# Patient Record
Sex: Male | Born: 1980 | Race: Black or African American | Hispanic: No | Marital: Married
Health system: Southern US, Community
[De-identification: ages and names within clinical notes are randomized; demographics above are authoritative.]

## PROBLEM LIST (undated history)

## (undated) DIAGNOSIS — K5792 Diverticulitis of intestine, part unspecified, without perforation or abscess without bleeding: Secondary | ICD-10-CM

## (undated) DIAGNOSIS — K4090 Unilateral inguinal hernia, without obstruction or gangrene, not specified as recurrent: Secondary | ICD-10-CM

## (undated) DIAGNOSIS — F419 Anxiety disorder, unspecified: Secondary | ICD-10-CM

## (undated) HISTORY — PX: PROSTATE BIOPSY: SHX241

---

## 2008-03-22 HISTORY — PX: HERNIA REPAIR: SHX51

## 2009-04-10 ENCOUNTER — Emergency Department (HOSPITAL_COMMUNITY): Admission: EM | Admit: 2009-04-10 | Discharge: 2009-04-10 | Payer: Self-pay | Admitting: Emergency Medicine

## 2010-02-08 ENCOUNTER — Emergency Department (HOSPITAL_COMMUNITY): Admission: EM | Admit: 2010-02-08 | Discharge: 2010-02-08 | Payer: Self-pay | Admitting: Emergency Medicine

## 2010-06-02 LAB — POCT I-STAT, CHEM 8
BUN: 8 mg/dL (ref 6–23)
Calcium, Ion: 1.13 mmol/L (ref 1.12–1.32)
Chloride: 107 meq/L (ref 96–112)
Creatinine, Ser: 0.9 mg/dL (ref 0.4–1.5)
Glucose, Bld: 97 mg/dL (ref 70–99)
HCT: 48 % (ref 39.0–52.0)
Hemoglobin: 16.3 g/dL (ref 13.0–17.0)
Potassium: 4.2 mEq/L (ref 3.5–5.1)
Sodium: 140 meq/L (ref 135–145)
TCO2: 25 mmol/L (ref 0–100)

## 2010-06-02 LAB — POCT CARDIAC MARKERS
CKMB, poc: <1 ng/mL — ABNORMAL LOW (ref 1.0–8.0)
Myoglobin, poc: 45.4 ng/mL (ref 12–200)
Troponin i, poc: <0.05 ng/mL (ref 0.00–0.09)

## 2010-06-02 LAB — RAPID URINE DRUG SCREEN, HOSP PERFORMED
Amphetamines: NOT DETECTED
Barbiturates: NOT DETECTED
Benzodiazepines: NOT DETECTED
Cocaine: NOT DETECTED
Opiates: NOT DETECTED
Tetrahydrocannabinol: POSITIVE — AB

## 2010-06-02 LAB — GLUCOSE, CAPILLARY: Glucose-Capillary: 99 mg/dL (ref 70–99)

## 2013-02-06 ENCOUNTER — Encounter (HOSPITAL_COMMUNITY): Payer: Self-pay | Admitting: Emergency Medicine

## 2013-02-06 ENCOUNTER — Emergency Department (HOSPITAL_COMMUNITY)
Admission: EM | Admit: 2013-02-06 | Discharge: 2013-02-06 | Disposition: A | Discharge status: Home / Self Care | Payer: BC Managed Care – PPO | Attending: Emergency Medicine | Admitting: Emergency Medicine

## 2013-02-06 DIAGNOSIS — K4090 Unilateral inguinal hernia, without obstruction or gangrene, not specified as recurrent: Secondary | ICD-10-CM

## 2013-02-06 DIAGNOSIS — F17200 Nicotine dependence, unspecified, uncomplicated: Secondary | ICD-10-CM | POA: Insufficient documentation

## 2013-02-06 DIAGNOSIS — N5089 Other specified disorders of the male genital organs: Secondary | ICD-10-CM | POA: Insufficient documentation

## 2013-02-06 DIAGNOSIS — R197 Diarrhea, unspecified: Secondary | ICD-10-CM | POA: Insufficient documentation

## 2013-02-06 DIAGNOSIS — Z791 Long term (current) use of non-steroidal anti-inflammatories (NSAID): Secondary | ICD-10-CM | POA: Insufficient documentation

## 2013-02-06 HISTORY — DX: Unilateral inguinal hernia, without obstruction or gangrene, not specified as recurrent: K40.90

## 2013-02-06 MED ORDER — NAPROXEN SODIUM 220 MG PO TABS: 220.0000 mg | ORAL_TABLET | Freq: Two times a day (BID) | ORAL | Status: DC

## 2013-02-06 MED ORDER — OXYCODONE-ACETAMINOPHEN 5-325 MG PO TABS: 1.0000 | ORAL_TABLET | Freq: Four times a day (QID) | ORAL | Status: DC | PRN

## 2013-02-06 NOTE — ED Provider Notes (Signed)
CSN: [id number]     Arrival date & time 02/06/13  1442 History  This chart was scribed for Isaiah Crigler, PA working with Shelda Jakes, MD by Quintella Reichert, ED Scribe. This patient was seen in room TR10C/TR10C and the patient's care was started at 2:56 PM.   Chief Complaint  Patient presents with  . Groin Pain    The history is provided by the patient. No language interpreter was used.    HPI Comments: Murrell Dome is a 32 y.o. male who presents to the Emergency Department complaining of several months of gradually-worsening recurrent left groin pain.  Pt had a hernia repair in 2010 in [place], [place] and states that over the past 90 days he has had a recurrence of hernia pain.  He states it has been gradually worsening and now has become "unbearable" but he denies any sudden or acute changes in severity.  He states he "feel like something's wrong" in that area of his body.  He also states his scrotum swells occasionally.  Today he has had diarrhea but he states he has otherwise been moving his bowels regularly.  He is passing gas.  He denies nausea or vomiting.  Pt went to UC several months ago for a separate issues and was advised that he also has a hernia on the right side although he has not felt pain on that side. Pain is not different in character or severity today.    Past Medical History  Diagnosis Date  . Inguinal hernia, left     Past Surgical History  Procedure Laterality Date  . Hernia repair      History reviewed. No pertinent family history.   History  Substance Use Topics  . Smoking status: Current Some Day Smoker  . Smokeless tobacco: Not on file  . Alcohol Use: Yes     Review of Systems  Constitutional: Negative for fever.  HENT: Negative for rhinorrhea and sore throat.   Eyes: Negative for redness.  Respiratory: Negative for cough.   Cardiovascular: Negative for chest pain.  Gastrointestinal: Positive for abdominal pain and diarrhea (only  today). Negative for nausea, vomiting and constipation.  Genitourinary: Positive for scrotal swelling. Negative for dysuria and testicular pain.       Groin pain  Musculoskeletal: Negative for myalgias.  Skin: Negative for rash.  Neurological: Negative for headaches.     Allergies  Review of patient's allergies indicates no known allergies.  Home Medications   Current Outpatient Rx  Name  Route  Sig  Dispense  Refill  . naproxen sodium (ALEVE) 220 MG tablet   Oral   Take 1 tablet (220 mg total) by mouth 2 (two) times daily with a meal.   20 tablet   0   . oxyCODONE-acetaminophen (PERCOCET/ROXICET) 5-325 MG per tablet   Oral   Take 1-2 tablets by mouth every 6 (six) hours as needed for severe pain.   12 tablet   0     BP 136/73  Pulse 73  Temp(Src) 98.7 F (37.1 C)  Resp 16  SpO2 99%  Physical Exam  Nursing note and vitals reviewed. Constitutional: He appears well-developed and well-nourished. No distress.  HENT:  Head: Normocephalic and atraumatic.  Eyes: Conjunctivae and EOM are normal. Right eye exhibits no discharge. Left eye exhibits no discharge.  Neck: Normal range of motion. Neck supple. No tracheal deviation present.  Cardiovascular: Normal rate, regular rhythm and normal heart sounds.   Pulmonary/Chest: Effort normal and breath sounds normal.  No respiratory distress.  Abdominal: Soft. There is no tenderness. A hernia is present. Hernia confirmed positive in the left inguinal area. Hernia confirmed negative in the right inguinal area.  Genitourinary:    Right testis shows no mass, no swelling and no tenderness. Left testis shows no mass, no swelling and no tenderness.  Musculoskeletal: Normal range of motion.  Lymphadenopathy:       Right: No inguinal adenopathy present.       Left: No inguinal adenopathy present.  Neurological: He is alert.  Skin: Skin is warm and dry.  Psychiatric: He has a normal mood and affect. His behavior is normal.    ED  Course  Procedures (including critical care time)  DIAGNOSTIC STUDIES: Oxygen Saturation is 99% on room air, normal by my interpretation.    COORDINATION OF CARE: 3:01 PM-Discussed treatment plan which includes pain medication and f/u with surgeon.  Advised return precautions.  Pt expressed understanding and agreed with plan.   Labs Review Labs Reviewed - No data to display  Imaging Review No results found.  EKG Interpretation   None      Patient seen and examined. Patient given CCS referral.   Vital signs reviewed and are as follows: Filed Vitals:   02/06/13 1450  BP: 136/73  Pulse: 73  Temp: 98.7 F (37.1 C)  Resp: 16   Patient counseled on use of narcotic pain medications. Counseled not to combine these medications with others containing tylenol. Urged not to drink alcohol, drive, or perform any other activities that requires focus while taking these medications. The patient verbalizes understanding and agrees with the plan.  Patient told to return with signs of obstruction including worsening abdominal pain, abdominal swelling, constipation, not passing gas -- 4 with signs of incarceration or strangulation including worsening pain, overlying redness. Patient verbalizes understanding and agrees with plan.   MDM   1. Inguinal hernia    Patient with left inguinal hernia and pain. Suspect failure of mesh, although he will need surgery followup for definitive evaluation and treatment. There are no signs of obstruction today. There are no signs of strangulation today. Patient's symptoms have been gradually worsening and there is no acute change to suggest these. Do not feel imaging necessary. Testicle non-tender. Pain control and follow-up indicated.    I personally performed the services described in this documentation, which was scribed in my presence. The recorded information has been reviewed and is accurate.    Isaiah Crigler, PA-C 02/06/13 1524

## 2013-02-06 NOTE — ED Notes (Addendum)
Left groin pain  Since surgery 2010 and it has gotten worse the last 90 days  Lifts heavy things for work left testicle swollen and tender since this am

## 2013-02-06 NOTE — ED Notes (Signed)
PA at bedside.

## 2013-02-10 NOTE — ED Provider Notes (Signed)
Medical screening examination/treatment/procedure(s) were performed by non-physician practitioner and as supervising physician I was immediately available for consultation/collaboration.  EKG Interpretation   None         Lyrique Hakim W. Aayush Gelpi, MD 02/10/13 1358 

## 2013-02-13 ENCOUNTER — Emergency Department (HOSPITAL_COMMUNITY)
Admission: EM | Admit: 2013-02-13 | Discharge: 2013-02-13 | Disposition: A | Discharge status: Home / Self Care | Payer: BC Managed Care – PPO | Attending: Emergency Medicine | Admitting: Emergency Medicine

## 2013-02-13 ENCOUNTER — Encounter (HOSPITAL_COMMUNITY): Payer: Self-pay | Admitting: Emergency Medicine

## 2013-02-13 ENCOUNTER — Encounter (INDEPENDENT_AMBULATORY_CARE_PROVIDER_SITE_OTHER): Payer: BC Managed Care – PPO | Admitting: Surgery

## 2013-02-13 DIAGNOSIS — R1032 Left lower quadrant pain: Secondary | ICD-10-CM | POA: Insufficient documentation

## 2013-02-13 DIAGNOSIS — R109 Unspecified abdominal pain: Secondary | ICD-10-CM

## 2013-02-13 DIAGNOSIS — F17200 Nicotine dependence, unspecified, uncomplicated: Secondary | ICD-10-CM | POA: Insufficient documentation

## 2013-02-13 DIAGNOSIS — Z9889 Other specified postprocedural states: Secondary | ICD-10-CM | POA: Insufficient documentation

## 2013-02-13 DIAGNOSIS — G8929 Other chronic pain: Secondary | ICD-10-CM | POA: Insufficient documentation

## 2013-02-13 LAB — BASIC METABOLIC PANEL
BUN: 11 mg/dL (ref 6–23)
Calcium: 9.6 mg/dL (ref 8.4–10.5)
Creatinine, Ser: 0.84 mg/dL (ref 0.50–1.35)
GFR calc Af Amer: >90 mL/min (ref >90)

## 2013-02-13 MED ORDER — MORPHINE SULFATE 4 MG/ML IJ SOLN
4.0000 mg | Freq: Once | INTRAMUSCULAR | Status: AC
  Administered 2013-02-13: 4 mg via INTRAVENOUS
  Filled 2013-02-13: qty 1

## 2013-02-13 MED ORDER — OXYCODONE-ACETAMINOPHEN 5-325 MG PO TABS: 1.0000 | ORAL_TABLET | ORAL | Status: DC | PRN

## 2013-02-13 MED ORDER — ONDANSETRON HCL 4 MG/2ML IJ SOLN
4.0000 mg | Freq: Once | INTRAMUSCULAR | Status: AC
  Administered 2013-02-13: 4 mg via INTRAVENOUS
  Filled 2013-02-13: qty 2

## 2013-02-13 NOTE — ED Notes (Signed)
Consulting MD at bedside

## 2013-02-13 NOTE — ED Notes (Signed)
Pt verbalizes he was at [facility] ED Nov 18 and was D/C'd and Dx w/ a hiatal hernia. Pt followed up w/ PCP yesterday and was sent to [facility] ED for a possible SBO.

## 2013-02-13 NOTE — ED Provider Notes (Signed)
I have reviewed the report and personally reviewed the above radiology studies.    Hilario Quarry, MD 02/13/13 Rickey Primus

## 2013-02-13 NOTE — Consult Note (Signed)
Isaiah Heath [birth year]  [id number].    Requesting MD: Dr. Rosalia Hammers Chief Complaint/Reason for Consult: Left groin pain  HPI:  32 y/o AA male who has a h/o of a left inguinal hernia repair with mesh in 2010.  For the last 3 months has had left groin pain which radiates to his pubic bone and down his inguinal canal.  He presented today (02/13/13) to [facility] due to the pain worsening.  The pain is there all the time, and he notes that there is some radiating pain to the scrotum and burning pain on the inner thigh.  He complains of not being able to work, care for his child etc.  He states lying down and sleeping improves the pain.  Ambulating worsens the pain.  Came to the ER on 02/06/13 for the same issue and was thought to have a hernia.  Pt thinks his mesh from his previous hernia repair is the problem and says his pain is unbearable.  Percocet's do not work for the pain.  He is accompanied by his male significant other.    He was seen in his PCP office through [facility] and they obtained a CT.  The CT shows normal post-hernia repair characteristics without recurrent hernia.  There were scattered air-fluid levels within mildly dilated loops of upper left quadrant and central small bowel, but oral contrast extends through the terminal ileum and cecum.  Radiologist questioned enteritis or SBO.  Pt denies any other symptoms including fever/chills, N/V/D, changes in bowel/bladder habits, abdominal pain, dizziness or weakness.    ROS: All systems reviewed and otherwise negative except for as above  No family history on file.  Past Medical History  Diagnosis Date  . Inguinal hernia, left     Past Surgical History  Procedure Laterality Date  . Hernia repair Left [place], [place] [facility] surgery, Dr. Nelda Marseille    Social History:  reports that he has been smoking.  He does not have any smokeless tobacco history on file. He reports that he drinks alcohol. His drug history is not on  file.  Allergies: No Known Allergies   (Not in a hospital admission)  Blood pressure 129/83, pulse 75, temperature 97.9 F (36.6 C), temperature source Oral, resp. rate 22, height 6\' 2"  (1.88 m), weight 153 lb (69.4 kg), SpO2 100.00%. Physical Exam: General: pleasant, WD/WN AA male who is laying in bed in NAD HEENT: head is normocephalic, atraumatic.  Sclera are noninjected.  PERRL.  Ears and nose without any masses or lesions.  Mouth is pink and moist Heart: regular, rate, and rhythm.  No obvious murmurs, gallops, or rubs noted.  Palpable pedal pulses bilaterally Lungs: CTAB, no wheezes, rhonchi, or rales noted.  Respiratory effort nonlabored Abd: soft, NT/ND, +BS, no masses, hernias, or organomegaly GU:  No hernias palpated b/l, left groin with noted mesh which protrudes through the skin more when standing, quite tender to palpation, tenderness to the left pubic region and inguinal canal, right groin is normal, no abnormalities to b/l testicles or scrotum, no penile discharge MS: all 4 extremities are symmetrical with no cyanosis, clubbing, or edema. Skin: warm and dry with no masses, lesions, or rashes Psych: A&Ox3 with an appropriate affect  Results for orders placed during the hospital encounter of 02/13/13 (from the past 48 hour(s))  BASIC METABOLIC PANEL     Status: Abnormal   Collection Time    02/13/13  2:32 PM      Result Value Range  Sodium 139  135 - 145 mEq/L   Potassium 5.2 (*) 3.5 - 5.1 mEq/L   Comment: HEMOLYSIS AT THIS LEVEL MAY AFFECT RESULT   Chloride 102  96 - 112 mEq/L   CO2 29  19 - 32 mEq/L   Glucose, Bld 93  70 - 99 mg/dL   BUN 11  6 - 23 mg/dL   Creatinine, Ser 1.61  0.50 - 1.35 mg/dL   Calcium 9.6  8.4 - 09.6 mg/dL   GFR calc non Af Amer >90  >90 mL/min   GFR calc Af Amer >90  >90 mL/min   Comment: (NOTE)     The eGFR has been calculated using the CKD EPI equation.     This calculation has not been validated in all clinical situations.     eGFR's  persistently <90 mL/min signify possible Chronic Kidney     Disease.   No results found.     Assessment/Plan Left groin/inguinal pain s/p hernia repair with mesh in 2010 in [place] without recurrence of hernia as seen on CT scan done at [facility] 1.  This is not an emergent/urgent problem; although it is a complex problem which will need to be further evaluated after operative report can be obtained for Dr. Dixon Boos review. 2.  Dr. Lindie Spruce would like to see the patient in the office after reviewing the documentation to discuss the possibility and feasibility of surgery.   3.  He can be discharged with pain medication and is referred to make an appointment with Dr. Lindie Spruce in the next few weeks.   Aris Georgia 02/13/2013, 4:19 PM Pager: [phone]

## 2013-02-13 NOTE — ED Provider Notes (Signed)
CSN: [id number]     Arrival date & time 02/13/13  1233 History   First MD Initiated Contact with Patient 02/13/13 1328     Chief Complaint  Patient presents with  . Abdominal Pain   (Consider location/radiation/quality/duration/timing/severity/associated sxs/prior Treatment) Patient is a 32 y.o. male presenting with abdominal pain. The history is provided by the patient. No language interpreter was used.  Abdominal Pain Pain location:  LLQ Associated symptoms: no chills and no fever   Associated symptoms comment:  Patient continues to have LLQ abdominal pain at previously repaired hernia site. He was seen in ED on 02/06/13 and by PCP at [facility] yesterday for same, with CT scan done yesterday showing "air-fluid levels within mildly dilated loops of LUQ and central abdominal small bowel". Labs done in office present with patient today without abnormality and include CBC w/diff, and a UA. No reported fever. Pain extends into left testicle without testicular swelling. No difficulty urinating. NO change in bowel movement, no melena.   Past Medical History  Diagnosis Date  . Inguinal hernia, left    Past Surgical History  Procedure Laterality Date  . Hernia repair     No family history on file. History  Substance Use Topics  . Smoking status: Current Some Day Smoker  . Smokeless tobacco: Not on file  . Alcohol Use: Yes    Review of Systems  Constitutional: Negative for fever and chills.  Gastrointestinal: Positive for abdominal pain.  Musculoskeletal: Negative.   Skin: Negative.   Neurological: Negative.     Allergies  Review of patient's allergies indicates no known allergies.  Home Medications   Current Outpatient Rx  Name  Route  Sig  Dispense  Refill  . oxyCODONE-acetaminophen (PERCOCET/ROXICET) 5-325 MG per tablet   Oral   Take 1-2 tablets by mouth every 6 (six) hours as needed for severe pain.   12 tablet   0    BP 117/71  Pulse 54  Temp(Src) 97.9 F (36.6  C) (Oral)  Resp 22  Ht 6\' 2"  (1.88 m)  Wt 153 lb (69.4 kg)  BMI 19.64 kg/m2  SpO2 99% Physical Exam  Constitutional: He is oriented to person, place, and time. He appears well-developed and well-nourished. No distress.  Pulmonary/Chest: Effort normal.  Abdominal:  Elongated inguinal mass on left that is firm, not compressible, significantly tender.   Genitourinary: No penile tenderness.  Left testicular tenderness without mass, or testicular swelling.  Musculoskeletal: Normal range of motion.  Neurological: He is alert and oriented to person, place, and time.    ED Course  Procedures (including critical care time) Labs Review Labs Reviewed - No data to display Imaging Review No results found. Results for orders placed during the hospital encounter of 02/13/13  BASIC METABOLIC PANEL      Result Value Range   Sodium 139  135 - 145 mEq/L   Potassium 5.2 (*) 3.5 - 5.1 mEq/L   Chloride 102  96 - 112 mEq/L   CO2 29  19 - 32 mEq/L   Glucose, Bld 93  70 - 99 mg/dL   BUN 11  6 - 23 mg/dL   Creatinine, Ser 7.82  0.50 - 1.35 mg/dL   Calcium 9.6  8.4 - 95.6 mg/dL   GFR calc non Af Amer >90  >90 mL/min   GFR calc Af Amer >90  >90 mL/min    EKG Interpretation   None       MDM  No diagnosis found. 1. Abdominal pain  2. Inguinal mass  Paperwork from [facility] faxed from office reviewed. Lab studies and CT results as per HPI. Surgery consulted.   Dr. Lindie Spruce has seen and evaluated patient, reviewed the CT. Does not feel immediate surgical repair required and he will be seen in the office.     Arnoldo Hooker, PA-C 02/13/13 1534  Arnoldo Hooker, PA-C 02/13/13 1639

## 2013-02-13 NOTE — Consult Note (Signed)
This patient has significant chronic left inguinal pain after a LIH open repair in [place] approximately 4 years ago.  On examination now he does not have evidence of a recurrent hernia, but he does have a bulging ridge of mesh on the left side which protrudes much more when he stands.  It is significant to the point where he cannot work and is disabled.  I do not believe that the patient is drug seeking, but this is a possibility.    No need for emergency surgery, but I will see the patient in the office for followup.  May need exploration and removal of mesh in the future.  Marta Lamas. Gae Bon, MD, FACS [phone] [facility]

## 2013-02-13 NOTE — ED Notes (Signed)
Pt told to come by MD to see a surgeon, states possibly has bowel obstruction. No vomiting/diarrhea. Had CT scan yesterday. LLQ abdominal pain began a week ago. Previous hernia repair in 2010.

## 2013-02-14 ENCOUNTER — Telehealth (INDEPENDENT_AMBULATORY_CARE_PROVIDER_SITE_OTHER): Payer: Self-pay

## 2013-02-14 NOTE — Telephone Encounter (Signed)
LMOM> office closed Friday. Please come to office Monday to have paperwork signed.

## 2013-02-19 ENCOUNTER — Telehealth (INDEPENDENT_AMBULATORY_CARE_PROVIDER_SITE_OTHER): Payer: Self-pay | Admitting: *Deleted

## 2013-02-19 NOTE — Telephone Encounter (Signed)
Error

## 2013-03-20 ENCOUNTER — Emergency Department (HOSPITAL_COMMUNITY): Payer: BC Managed Care – PPO

## 2013-03-20 ENCOUNTER — Emergency Department (HOSPITAL_COMMUNITY)
Admission: EM | Admit: 2013-03-20 | Discharge: 2013-03-20 | Disposition: A | Discharge status: Home / Self Care | Payer: BC Managed Care – PPO | Attending: Emergency Medicine | Admitting: Emergency Medicine

## 2013-03-20 ENCOUNTER — Ambulatory Visit (INDEPENDENT_AMBULATORY_CARE_PROVIDER_SITE_OTHER): Payer: BC Managed Care – PPO | Admitting: General Surgery

## 2013-03-20 ENCOUNTER — Encounter (HOSPITAL_COMMUNITY): Payer: Self-pay | Admitting: Emergency Medicine

## 2013-03-20 DIAGNOSIS — F17200 Nicotine dependence, unspecified, uncomplicated: Secondary | ICD-10-CM | POA: Insufficient documentation

## 2013-03-20 DIAGNOSIS — R630 Anorexia: Secondary | ICD-10-CM | POA: Insufficient documentation

## 2013-03-20 DIAGNOSIS — R1032 Left lower quadrant pain: Secondary | ICD-10-CM | POA: Insufficient documentation

## 2013-03-20 DIAGNOSIS — Z8719 Personal history of other diseases of the digestive system: Secondary | ICD-10-CM | POA: Insufficient documentation

## 2013-03-20 DIAGNOSIS — R109 Unspecified abdominal pain: Secondary | ICD-10-CM

## 2013-03-20 DIAGNOSIS — R634 Abnormal weight loss: Secondary | ICD-10-CM | POA: Insufficient documentation

## 2013-03-20 MED ORDER — ETODOLAC 200 MG PO CAPS
200.0000 mg | ORAL_CAPSULE | Freq: Three times a day (TID) | ORAL | Status: DC
Start: 2013-03-20 — End: 2013-11-16

## 2013-03-20 MED ORDER — FENTANYL CITRATE 0.05 MG/ML IJ SOLN
50.0000 ug | Freq: Once | INTRAMUSCULAR | Status: AC
  Administered 2013-03-20: 50 ug via NASAL
  Filled 2013-03-20: qty 2

## 2013-03-20 MED ORDER — TRAMADOL HCL 50 MG PO TABS: 50.0000 mg | ORAL_TABLET | Freq: Four times a day (QID) | ORAL | Status: DC | PRN

## 2013-03-20 NOTE — ED Notes (Signed)
Pt in c/o left inguinal hernia, states he has had it for about a month, seen here for same in the past, was supposed to see [place] [facility] this am but overslept and missed his appointment, here due to continued pain

## 2013-03-20 NOTE — ED Provider Notes (Signed)
CSN: [id number]     Arrival date & time 03/20/13  1345 History   First MD Initiated Contact with Patient 03/20/13 1655     Chief Complaint  Patient presents with  . Inguinal Hernia   (Consider location/radiation/quality/duration/timing/severity/associated sxs/prior Treatment) HPI Comments: Patient presents to ER for evaluation of abdominal pain. Patient has been experiencing left lower abdominal pain for more than a month. He has been evaluated in the ER and his primary care doctor for this. It has been felt that the patient's pain is secondary to recurrent inguinal hernia. Patient was supposed to followup with surgery, but did not make appointment. Patient is now in the ER with persistent, burning pain. At times pain is severe, worsens with movements. He reports occasionally gets a lump in the area. It is not currently present. There has not been any fever, nausea, vomiting or diarrhea.   Past Medical History  Diagnosis Date  . Inguinal hernia, left    Past Surgical History  Procedure Laterality Date  . Hernia repair Left [place], [place] [facility] surgery, Dr. Nelda Marseille   History reviewed. No pertinent family history. History  Substance Use Topics  . Smoking status: Current Some Day Smoker -- 10 years  . Smokeless tobacco: Not on file  . Alcohol Use: Yes    Review of Systems  Gastrointestinal: Positive for abdominal pain.  All other systems reviewed and are negative.    Allergies  Review of patient's allergies indicates no known allergies.  Home Medications  No current outpatient prescriptions on file. BP 110/59  Pulse 59  Temp(Src) 98.1 F (36.7 C) (Oral)  Resp 22  Wt 152 lb (68.947 kg)  SpO2 100% Physical Exam  Constitutional: He is oriented to person, place, and time. He appears well-developed and well-nourished. No distress.  HENT:  Head: Normocephalic and atraumatic.  Right Ear: Hearing normal.  Left Ear: Hearing normal.  Nose: Nose normal.    Mouth/Throat: Oropharynx is clear and moist and mucous membranes are normal.  Eyes: Conjunctivae and EOM are normal. Pupils are equal, round, and reactive to light.  Neck: Normal range of motion. Neck supple.  Cardiovascular: Regular rhythm, S1 normal and S2 normal.  Exam reveals no gallop and no friction rub.   No murmur heard. Pulmonary/Chest: Effort normal and breath sounds normal. No respiratory distress. He exhibits no tenderness.  Abdominal: Soft. Normal appearance and bowel sounds are normal. There is no hepatosplenomegaly. There is tenderness in the left lower quadrant. There is no rebound, no guarding, no tenderness at McBurney's point and negative Murphy's sign. No hernia.    Musculoskeletal: Normal range of motion.  Neurological: He is alert and oriented to person, place, and time. He has normal strength. No cranial nerve deficit or sensory deficit. Coordination normal. GCS eye subscore is 4. GCS verbal subscore is 5. GCS motor subscore is 6.  Skin: Skin is warm, dry and intact. No rash noted. No cyanosis.  Psychiatric: He has a normal mood and affect. His speech is normal and behavior is normal. Thought content normal.    ED Course  Procedures (including critical care time) Labs Review Labs Reviewed - No data to display Imaging Review Dg Abd Acute W/chest  03/20/2013   CLINICAL DATA:  Burning on left inguinal hernia repair site. There is a slight bulge has been there for some time. No chest complaints. Irregular bowel movements.  EXAM: ACUTE ABDOMEN SERIES (ABDOMEN 2 VIEW & CHEST 1 VIEW)  COMPARISON:  CT abdomen 02/13/2011  FINDINGS: There is no evidence of dilated bowel loops or free intraperitoneal air. No radiopaque calculi or other significant radiographic abnormality is seen. Heart size and mediastinal contours are within normal limits. Both lungs are clear.  IMPRESSION: Negative abdominal radiographs.  No acute cardiopulmonary disease.   Electronically Signed   By: Elige Ko   On: 03/20/2013 17:57    EKG Interpretation   None       MDM  Diagnosis: Abdominal pain  Patient presents to the ER for persistent left lower abdominal and inguinal pain. Patient is extremely anxious. Patient indicates that he has been doing research on the Internet and has become quite fixated on this surgical mesh that he has had placed. He feels like there is some kind of syndrome that has been causing him poor appetite, weight loss, chronic pain. He has apparently come across similar symptoms while searching the Internet.  Examination is unremarkable other than tenderness. He has some subcutaneous nodules at the area of previous inguinal hernia, possibly scar tissue but I do not feel any obvious hernia. There is certainly no evidence of incarcerated hernia that would require any urgent surgical repair.  Patient was counseled that a lot of his symptoms might be because he is obsessing about the symptoms. He was told that he needs to followup with a surgeon to have a specialist's interpretation of his current symptoms to determine ultimate treatment.    Gilda Crease, MD 03/20/13 [phone]

## 2013-03-27 ENCOUNTER — Encounter (INDEPENDENT_AMBULATORY_CARE_PROVIDER_SITE_OTHER): Payer: Self-pay | Admitting: General Surgery

## 2013-11-16 ENCOUNTER — Encounter (HOSPITAL_COMMUNITY): Payer: Self-pay | Admitting: Emergency Medicine

## 2013-11-16 ENCOUNTER — Emergency Department (HOSPITAL_COMMUNITY): Payer: BC Managed Care – PPO

## 2013-11-16 ENCOUNTER — Emergency Department (HOSPITAL_COMMUNITY)
Admission: EM | Admit: 2013-11-16 | Discharge: 2013-11-16 | Disposition: A | Discharge status: Home / Self Care | Payer: BC Managed Care – PPO | Attending: Emergency Medicine | Admitting: Emergency Medicine

## 2013-11-16 DIAGNOSIS — R1031 Right lower quadrant pain: Secondary | ICD-10-CM

## 2013-11-16 DIAGNOSIS — N5089 Other specified disorders of the male genital organs: Secondary | ICD-10-CM | POA: Insufficient documentation

## 2013-11-16 DIAGNOSIS — K921 Melena: Secondary | ICD-10-CM | POA: Diagnosis not present

## 2013-11-16 DIAGNOSIS — F17200 Nicotine dependence, unspecified, uncomplicated: Secondary | ICD-10-CM | POA: Diagnosis not present

## 2013-11-16 DIAGNOSIS — K649 Unspecified hemorrhoids: Secondary | ICD-10-CM | POA: Diagnosis not present

## 2013-11-16 DIAGNOSIS — R1032 Left lower quadrant pain: Secondary | ICD-10-CM | POA: Diagnosis not present

## 2013-11-16 DIAGNOSIS — Z8719 Personal history of other diseases of the digestive system: Secondary | ICD-10-CM | POA: Diagnosis not present

## 2013-11-16 DIAGNOSIS — R109 Unspecified abdominal pain: Secondary | ICD-10-CM | POA: Insufficient documentation

## 2013-11-16 DIAGNOSIS — R350 Frequency of micturition: Secondary | ICD-10-CM | POA: Diagnosis not present

## 2013-11-16 DIAGNOSIS — K648 Other hemorrhoids: Secondary | ICD-10-CM

## 2013-11-16 LAB — COMPREHENSIVE METABOLIC PANEL
ALK PHOS: 74 U/L (ref 39–117)
ALT: 12 U/L (ref 0–53)
AST: 17 U/L (ref 0–37)
Albumin: 4.4 g/dL (ref 3.5–5.2)
Anion gap: 11 (ref 5–15)
BUN: 7 mg/dL (ref 6–23)
CALCIUM: 9.6 mg/dL (ref 8.4–10.5)
CO2: 28 meq/L (ref 19–32)
Chloride: 103 mEq/L (ref 96–112)
Creatinine, Ser: 0.79 mg/dL (ref 0.50–1.35)
GLUCOSE: 111 mg/dL — AB (ref 70–99)
Potassium: 4 mEq/L (ref 3.7–5.3)
SODIUM: 142 meq/L (ref 137–147)
Total Bilirubin: 0.7 mg/dL (ref 0.3–1.2)
Total Protein: 7.7 g/dL (ref 6.0–8.3)

## 2013-11-16 LAB — CBC
HEMATOCRIT: 41.2 % (ref 39.0–52.0)
HEMOGLOBIN: 13.6 g/dL (ref 13.0–17.0)
MCH: 28.7 pg (ref 26.0–34.0)
MCHC: 33 g/dL (ref 30.0–36.0)
MCV: 86.9 fL (ref 78.0–100.0)
Platelets: 195 10*3/uL (ref 150–400)
RBC: 4.74 MIL/uL (ref 4.22–5.81)
RDW: 12.1 % (ref 11.5–15.5)
WBC: 4.6 10*3/uL (ref 4.0–10.5)

## 2013-11-16 LAB — URINALYSIS, ROUTINE W REFLEX MICROSCOPIC
Bilirubin Urine: NEGATIVE
GLUCOSE, UA: NEGATIVE mg/dL
HGB URINE DIPSTICK: NEGATIVE
KETONES UR: NEGATIVE mg/dL
Leukocytes, UA: NEGATIVE
Nitrite: NEGATIVE
PROTEIN: NEGATIVE mg/dL
Specific Gravity, Urine: 1.021 (ref 1.005–1.030)
Urobilinogen, UA: 1 mg/dL (ref 0.0–1.0)
pH: 7.5 (ref 5.0–8.0)

## 2013-11-16 LAB — POC OCCULT BLOOD, ED: Fecal Occult Bld: NEGATIVE

## 2013-11-16 MED ORDER — TRAMADOL HCL 50 MG PO TABS: 50.0000 mg | ORAL_TABLET | Freq: Four times a day (QID) | ORAL | Status: DC | PRN

## 2013-11-16 MED ORDER — MORPHINE SULFATE 4 MG/ML IJ SOLN
4.0000 mg | Freq: Once | INTRAMUSCULAR | Status: AC
  Administered 2013-11-16: 4 mg via INTRAVENOUS
  Filled 2013-11-16: qty 1

## 2013-11-16 MED ORDER — IBUPROFEN 600 MG PO TABS: 600.0000 mg | ORAL_TABLET | Freq: Three times a day (TID) | ORAL | Status: DC

## 2013-11-16 MED ORDER — MORPHINE SULFATE 4 MG/ML IJ SOLN
4.0000 mg | Freq: Once | INTRAMUSCULAR | Status: AC
Start: 2013-11-16 — End: 2013-11-16
  Administered 2013-11-16: 4 mg via INTRAVENOUS
  Filled 2013-11-16: qty 1

## 2013-11-16 NOTE — ED Notes (Signed)
US at bedside

## 2013-11-16 NOTE — ED Provider Notes (Signed)
Complains of right groin pain for the past 3 days. Became worse after lifting 3 days ago. Last bowel movement yesterday. On exam mildly uncomfortable abdomen normoactive bowel sounds nondistended mildly tender at left suprapubic area. Also complains of left-sided abdominal pain which is chronic and unchanged from 2010 Genitalia normal male. Tender at left inguinal area at reduction of masses. Testes normal.  Doug Sou, MD 11/16/13 [phone]

## 2013-11-16 NOTE — ED Provider Notes (Signed)
Medical screening examination/treatment/procedure(s) were conducted as a shared visit with non-physician practitioner(s) and myself.  I personally evaluated the patient during the encounter.   EKG Interpretation None       Doug Sou, MD 11/16/13 [phone]

## 2013-11-16 NOTE — ED Notes (Signed)
Pt reports R groin pain for past 2-3 months that has gotten worse over past few days. Pt reports repair of L side inguinal hernia repair 4 months ago with incarceration. Pt the same symptoms on R side. Reports testicle swelling worse on R side. Reports dysuria. LBM yesterday with blood. Reports tarry stool. Pt does heavy lifting at work.

## 2013-11-16 NOTE — Discharge Instructions (Signed)
Call for a follow up appointment with a Family or Primary Care Provider.  Call Dr. Lindie Spruce for further evaluation of your discomfort as previously advised. Return if Symptoms worsen.   Take medication as prescribed.    Emergency Department Resource Guide 1) Find a Doctor and Pay Out of Pocket Although you won't have to find out who is covered by your insurance plan, it is a good idea to ask around and get recommendations. You will then need to call the office and see if the doctor you have chosen will accept you as a new patient and what types of options they offer for patients who are self-pay. Some doctors offer discounts or will set up payment plans for their patients who do not have insurance, but you will need to ask so you aren't surprised when you get to your appointment.  2) Contact Your Local Health Department Not all health departments have doctors that can see patients for sick visits, but many do, so it is worth a call to see if yours does. If you don't know where your local health department is, you can check in your phone book. The CDC also has a tool to help you locate your state's health department, and many state websites also have listings of all of their local health departments.  3) Find a Walk-in Clinic If your illness is not likely to be very severe or complicated, you may want to try a walk in clinic. These are popping up all over the country in pharmacies, drugstores, and shopping centers. They're usually staffed by nurse practitioners or physician assistants that have been trained to treat common illnesses and complaints. They're usually fairly quick and inexpensive. However, if you have serious medical issues or chronic medical problems, these are probably not your best option.  No Primary Care Doctor: - Call Health Connect at  [phone] - they can help you locate a primary care doctor that  accepts your insurance, provides certain services, etc. - Physician Referral Service-  [phone]  Chronic Pain Problems: Organization         Address  Phone   Notes  [facility]  [phone] Patients need to be referred by their primary care doctor.   Medication Assistance: Organization         Address  Phone   Notes  [facility] Medication [facility] [address] [place] [place], [place] [address] [phone] --Must be a resident of [facility] -- Must have NO insurance coverage whatsoever (no Medicaid/ Medicare, etc.) -- The pt. MUST have a primary care doctor that directs their care regularly and follows them in the community   MedAssist  [phone]   [employer]  [phone]    Agencies that provide inexpensive medical care: Organization         Address  Phone   Notes  [facility]  [phone]   [facility]    [phone]   [facility] [address] [place], [place] [address] [phone]   [facility] [address] [place], [place] [phone]   Planned Parenthood    [phone]   [facility]    [phone]   [facility]  [address], [place] Phone:  [phone], Fax:  [phone] Hours of Operation:  9 am - 6 pm, M-F.  Also accepts Medicaid/Medicare and self-pay.  [facility]  [place] for [place], [address], [place] Phone: [phone], Fax: [phone]. Hours of Operation:  8:30 am - 5:30 pm, M-F.  Also accepts Medicaid and self-pay.  [facility] [address] [place] Phone: [phone]   [place], [place], [place] [phone], Ext. [phone] Mondays & Thursdays: 7-9 AM.  First 15 patients are seen on a first come, first serve basis.    [place] Providers:  Organization         Address  Phone   Notes  [facility] [address],  [place] [phone] Also accepts self-pay patients.  [facility] [id number] [place], [place]  [phone]   [place], [address], [place] [phone]   [facility] [address] [place] [phone]   Lucianne Lei [address], [place]   [phone] Only accepts [place] Access [place] patients after they have their name applied to their card.   Self-Pay (no insurance) in [facility]:  Organization         Address  Phone   Notes  Sickle Cell Patients, [facility] [place] [phone]   [facility] [place] [phone]   [facility] [place]  [place], [place], [place] [phone]   [facility]/Dr. Osei-Bonsu  [address], [place] or [address], [place] [phone] Phone number for both [place] and [place] locations is the same.  [facility] [address] [place] [phone]   [facility]  [address], [place] or [address] [place] [address] [phone]   [facility] [address], [place] [phone], phone; [phone], fax Sees patients 1st and 3rd Saturday of every month.  Must not qualify for public or private insurance (i.e. Medicaid, Medicare, [place] Health Choice, Veterans' Benefits)  Household income should be no more than 200% of the poverty level The clinic cannot treat you if you are pregnant or think you are pregnant  Sexually transmitted diseases are not treated at the clinic.    Dental Care: Organization         Address  Phone  Notes  [facility] Department of [facility] [place] [phone] Accepts children up to age 40 who are enrolled in  [place] or [place]; pregnant women with a Medicaid card; and children who have applied for Medicaid or [place] Health Choice, but were declined, whose parents can pay a reduced fee at time of service.  [facility] Department of [facility]  [address], [place] [phone] Accepts children up to age 85 who are enrolled in [place] or [place]; pregnant women with a Medicaid card; and children who have applied for Medicaid or [place] Health Choice, but were declined, whose parents can pay a reduced fee at time of service.  [facility]  [place] [phone] Patients are seen by appointment only. Walk-ins are not accepted. [place] will see patients 62 years of age and older. Monday - Tuesday (8am-5pm) Most Wednesdays (8:30-5pm) $30 per visit,  cash only  [employer] Adult [employer] PRO[facility]  [address], [place] [phone] Patients are seen by appointment only. Walk-ins are not accepted. [place] will see patients 68 years of age and older. One Wednesday Evening (Monthly: Volunteer Based).  $30 per visit, cash only  [place]  [phone] for adults; Children under age 32, call Graduate Pediatric Dentistry at [phone]. Children aged 36-14, please call [phone] to request a pediatric application.  Dental services are provided in all areas of dental care including fillings, crowns and bridges, complete and partial dentures, implants, gum treatment, root canals, and extractions. Preventive care is also provided. Treatment is provided to both adults and children. Patients are selected via a lottery and there is often a waiting list.   [facility] [address] [place]  [phone] [email or web address]   [facility] [address] [place], [place] [phone], Ext. [phone] Second and Fourth Thursday of each month, opens at 6:30  AM; Clinic ends at 9 AM.  Patients are seen on a first-come first-served basis, and a limited number are seen during each clinic.   [facility]  [address] [place]ard Danker [place], [place] [phone]   Eligibility Requirements You must have lived in [place], [place], or [place] counties for at least the last three months.   You cannot be eligible for state or federal sponsored [employer], including [employer], [place], or [employer].   You generally cannot be eligible for healthcare insurance through your employer.    How to apply: Eligibility screenings are held every Tuesday and Wednesday afternoon from 1:00 pm until 4:00 pm. You do not need an appointment for the interview!  [facility] [address] [place], [place]   [place]  [place] Department  [place]  [phone]    Behavioral Health Resources in the Community: Intensive Outpatient Programs Organization         Address  Phone  Notes  Bejou Flanagan. [address], [place], [place] [phone]   [facility] Outpatient [address] [place], [place]   ADS: [facility] [address] [place], [place]   [place] [address],  [place], [place] or [phone]   Substance Abuse Resources Organization         Address  Phone  Notes  Alcohol [facility] Drug Services  [phone]   [place]  [phone]   [facility] [place]   Chinita Pester  [phone]   Residential & Outpatient Substance Abuse Program  [phone]   Psychological Services Organization         Address  Phone  Notes  [facility] [place]  [place]  [phone]   [place] [address] [place] or  [phone]    Mobile Crisis Teams Organization         Address  Phone  Notes  [facility], Mobile Crisis Care Unit  [phone]   [facility]  [address] [place], [place]   [place] [address], [place] [phone]    Self-Help/Support Groups Organization         Address  Phone  Notes  [facility] [place] - variety of support groups  Dora Call for more information  Narcotics Anonymous (NA), [facility] [address], [employer] [place]  2 meetings at this location   Special educational needs teacher         Address  Phone  Notes  [facility] [place],    [place]  [phone]   [facility]  [address] [place] [phone], [place], [place]   [place], [place] [phone] Admissions: 8am-3pm M-F  Incentives Substance [place] [address],    [place], [place] [phone]   [facility] [address] [place], [place], [place]   [facility] [address],  [place], [place]   [facility] - Intensive Outpatient [address] Kristeen Mans 22, [place] at [place], [place]   [facility] [place]ie.,  [place], [place] [phone] or [phone]   [facility] (RTS) [address] [place], [place] Accepts Medicaid  [facility] [place] [address],  [place] [phone] Substance Abuse/Addiction Treatment   [facility] Organization         Address  Phone  Notes  [facility]  [phone]   Domenic Schwab, PhD [address] [place], [place]   [phone] or [phone]   [place], [place] [phone]   [facility] [address],  [place], [place] [phone] Insurance/Medicaid/sponsorship through [facility] and Families [address], [address] [place]                                    [place], [place] [phone] [place] [address], [place] [phone]    Dr. Adele Schilder  [phone]   [facility] Dept. 1) [address], [place] 2) [place] 3)  [place] [address], [place] [phone]  [phone]   [place] [phone] or [phone] (After Hours)

## 2013-11-16 NOTE — ED Provider Notes (Signed)
CSN: [id number]     Arrival date & time 11/16/13  1025 History   First MD Initiated Contact with Patient 11/16/13 1035     Chief Complaint  Patient presents with  . Groin Pain  . Rectal Bleeding     (Consider location/radiation/quality/duration/timing/severity/associated sxs/prior Treatment) HPI Comments: The patient is a 33 year old male with past medical history of left inguinal hernia status post repair presenting to emergency room chief complaint of persistent and worsening right lower abdominal discomfort and right scrotal swelling for 2 months. The patient reports increase in discomfort over the past 4 days and swelling over the last 2days after heavy lifting.  Patient reports last bowel movement today, normal. He reports one episode of dark stool with blood on Monday. Denies rectal pain or history of hemorrhoid. Denies nausea, vomiting, dysuria, hematuria.  He reports taking Tylenol yesterday without full resolution of symptoms.  Patient has not followed up with Dr. Lindie Spruce as previously advised.  Patient is a 33 y.o. male presenting with groin pain and hematochezia. The history is provided by the patient. No language interpreter was used.  Groin Pain Associated symptoms include abdominal pain. Pertinent negatives include no chills, fever, nausea or vomiting.  Rectal Bleeding Associated symptoms: abdominal pain   Associated symptoms: no fever and no vomiting     Past Medical History  Diagnosis Date  . Inguinal hernia, left    Past Surgical History  Procedure Laterality Date  . Hernia repair Left [place], [place] [facility], Dr. Nelda Marseille   No family history on file. History  Substance Use Topics  . Smoking status: Current Some Day Smoker -- 10 years  . Smokeless tobacco: Not on file  . Alcohol Use: Yes    Review of Systems  Constitutional: Negative for fever and chills.  Gastrointestinal: Positive for abdominal pain, blood in stool and hematochezia. Negative  for nausea, vomiting and diarrhea.  Genitourinary: Positive for frequency and scrotal swelling. Negative for dysuria, hematuria and discharge.      Allergies  Review of patient's allergies indicates no known allergies.  Home Medications   Prior to Admission medications   Medication Sig Start Date End Date Taking? Authorizing Provider  acetaminophen (TYLENOL) 500 MG tablet Take 1,000 mg by mouth once as needed for moderate pain.   Yes Historical Provider, MD  traMADol (ULTRAM) 50 MG tablet Take 1 tablet (50 mg total) by mouth every 6 (six) hours as needed. 03/20/13  Yes Gilda Crease, MD   BP 122/77  Pulse 72  Temp(Src) 98.4 F (36.9 C) (Oral)  Resp 16  SpO2 98% Physical Exam  Nursing note and vitals reviewed. Constitutional: He is oriented to person, place, and time. He appears well-developed and well-nourished. No distress.  HENT:  Head: Normocephalic and atraumatic.  Neck: Neck supple.  Cardiovascular: Normal rate and regular rhythm.   Abdominal: Soft. Bowel sounds are normal. He exhibits no distension. There is tenderness in the right lower quadrant. There is no rebound and no guarding.  Genitourinary: Rectal exam shows external hemorrhoid. Right testis shows tenderness. Right testis shows no mass. Left testis shows no tenderness. Circumcised.  No obvious hernia or mass in the scrotum. Chaperone present.   Musculoskeletal: Normal range of motion.  Neurological: He is alert and oriented to person, place, and time.  Skin: Skin is warm. He is not diaphoretic.  Psychiatric: He has a normal mood and affect. His behavior is normal.    ED Course  Procedures (including critical care  time) Labs Review Results for orders placed during the hospital encounter of 11/16/13  CBC      Result Value Ref Range   WBC 4.6  4.0 - 10.5 K/uL   RBC 4.74  4.22 - 5.81 MIL/uL   Hemoglobin 13.6  13.0 - 17.0 g/dL   HCT 16.1  09.6 - 04.5 %   MCV 86.9  78.0 - 100.0 fL   MCH 28.7  26.0 -  34.0 pg   MCHC 33.0  30.0 - 36.0 g/dL   RDW 40.9  81.1 - 91.4 %   Platelets 195  150 - 400 K/uL  COMPREHENSIVE METABOLIC PANEL      Result Value Ref Range   Sodium 142  137 - 147 mEq/L   Potassium 4.0  3.7 - 5.3 mEq/L   Chloride 103  96 - 112 mEq/L   CO2 28  19 - 32 mEq/L   Glucose, Bld 111 (*) 70 - 99 mg/dL   BUN 7  6 - 23 mg/dL   Creatinine, Ser 7.82  0.50 - 1.35 mg/dL   Calcium 9.6  8.4 - 95.6 mg/dL   Total Protein 7.7  6.0 - 8.3 g/dL   Albumin 4.4  3.5 - 5.2 g/dL   AST 17  0 - 37 U/L   ALT 12  0 - 53 U/L   Alkaline Phosphatase 74  39 - 117 U/L   Total Bilirubin 0.7  0.3 - 1.2 mg/dL   GFR calc non Af Amer >90  >90 mL/min   GFR calc Af Amer >90  >90 mL/min   Anion gap 11  5 - 15  URINALYSIS, ROUTINE W REFLEX MICROSCOPIC      Result Value Ref Range   Color, Urine YELLOW  YELLOW   APPearance CLOUDY (*) CLEAR   Specific Gravity, Urine 1.021  1.005 - 1.030   pH 7.5  5.0 - 8.0   Glucose, UA NEGATIVE  NEGATIVE mg/dL   Hgb urine dipstick NEGATIVE  NEGATIVE   Bilirubin Urine NEGATIVE  NEGATIVE   Ketones, ur NEGATIVE  NEGATIVE mg/dL   Protein, ur NEGATIVE  NEGATIVE mg/dL   Urobilinogen, UA 1.0  0.0 - 1.0 mg/dL   Nitrite NEGATIVE  NEGATIVE   Leukocytes, UA NEGATIVE  NEGATIVE  POC OCCULT BLOOD, ED      Result Value Ref Range   Fecal Occult Bld NEGATIVE  NEGATIVE   US Scrotum  11/16/2013   CLINICAL DATA:  Left groin pain.  Prior left hernia repair.  EXAM: SCROTAL ULTRASOUND  DOPPLER ULTRASOUND OF THE TESTICLES  TECHNIQUE: Complete ultrasound examination of the testicles, epididymis, and other scrotal structures was performed. Color and spectral Doppler ultrasound were also utilized to evaluate blood flow to the testicles.  COMPARISON:  None.  FINDINGS: Right testicle  Measurements: 4.3 x 2.3 x 3.4 cm. No mass or microlithiasis visualized.  Left testicle  Measurements: 4.2 x 2.1 x 2.8 cm. No mass or microlithiasis visualized.  Right epididymis:  Normal in size and appearance.  Left  epididymis:  Normal in size and appearance.  Hydrocele:  None visualized.  Varicocele:  Left-sided varicocele  Pulsed Doppler interrogation of both testes demonstrates low resistance arterial and venous waveforms bilaterally.  IMPRESSION: No sonographic evidence to suggest testicular torsion.  No definite sonographic abnormality identified at the patients site of focal pain.   Electronically Signed   By: Annia Belt M.D.   On: 11/16/2013 12:33   [place] Art/ven Flow Abd Pelv Doppler  11/16/2013  CLINICAL DATA:  Left groin pain.  Prior left hernia repair.  EXAM: SCROTAL ULTRASOUND  DOPPLER ULTRASOUND OF THE TESTICLES  TECHNIQUE: Complete ultrasound examination of the testicles, epididymis, and other scrotal structures was performed. Color and spectral Doppler ultrasound were also utilized to evaluate blood flow to the testicles.  COMPARISON:  None.  FINDINGS: Right testicle  Measurements: 4.3 x 2.3 x 3.4 cm. No mass or microlithiasis visualized.  Left testicle  Measurements: 4.2 x 2.1 x 2.8 cm. No mass or microlithiasis visualized.  Right epididymis:  Normal in size and appearance.  Left epididymis:  Normal in size and appearance.  Hydrocele:  None visualized.  Varicocele:  Left-sided varicocele  Pulsed Doppler interrogation of both testes demonstrates low resistance arterial and venous waveforms bilaterally.  IMPRESSION: No sonographic evidence to suggest testicular torsion.  No definite sonographic abnormality identified at the patients site of focal pain.   Electronically Signed   By: Annia Belt M.D.   On: 11/16/2013 12:33     EKG Interpretation None      MDM   Final diagnoses:  Groin pain, right  Other hemorrhoids   Patient presents with right groin pain, similar to previous hernia. No other concerning abnormality. Dr. Ethelda Chick, also evaluated the patient during this encounter, normal GU exam. Plan to ultrasound scrotum.   Negative ultrasound, no concerning abnormalities on UA, CBC, CMP. Negative  fecal occult blood likely do to external hemorrhoids. Plan to treat pain and followup with PCP and Dr. Lindie Spruce as needed. Discussed lab results, imaging results, and treatment plan with the patient. Return precautions given. Reports understanding and no other concerns at this time.  Patient is stable for discharge at this time.  Meds given in ED:  Medications  morphine 4 MG/ML injection 4 mg (4 mg Intravenous Given 11/16/13 1124)  morphine 4 MG/ML injection 4 mg (4 mg Intravenous Given 11/16/13 1234)    Discharge Medication List as of 11/16/2013  1:06 PM    START taking these medications   Details  ibuprofen (ADVIL,MOTRIN) 600 MG tablet Take 1 tablet (600 mg total) by mouth 3 (three) times daily with meals., Starting 11/16/2013, Until Discontinued, Print    !! traMADol (ULTRAM) 50 MG tablet Take 1 tablet (50 mg total) by mouth every 6 (six) hours as needed., Starting 11/16/2013, Until Discontinued, Print     !! - Potential duplicate medications found. Please discuss with provider.          Mellody Drown, PA-C 11/16/13 [phone]

## 2014-02-24 ENCOUNTER — Emergency Department (HOSPITAL_COMMUNITY)
Admission: EM | Admit: 2014-02-24 | Discharge: 2014-02-24 | Disposition: A | Discharge status: Home / Self Care | Payer: BC Managed Care – PPO | Attending: Emergency Medicine | Admitting: Emergency Medicine

## 2014-02-24 ENCOUNTER — Encounter (HOSPITAL_COMMUNITY): Payer: Self-pay | Admitting: Emergency Medicine

## 2014-02-24 DIAGNOSIS — Z8669 Personal history of other diseases of the nervous system and sense organs: Secondary | ICD-10-CM | POA: Diagnosis not present

## 2014-02-24 DIAGNOSIS — Z720 Tobacco use: Secondary | ICD-10-CM | POA: Diagnosis not present

## 2014-02-24 DIAGNOSIS — Z791 Long term (current) use of non-steroidal anti-inflammatories (NSAID): Secondary | ICD-10-CM | POA: Insufficient documentation

## 2014-02-24 DIAGNOSIS — B349 Viral infection, unspecified: Secondary | ICD-10-CM

## 2014-02-24 DIAGNOSIS — Z8719 Personal history of other diseases of the digestive system: Secondary | ICD-10-CM | POA: Diagnosis not present

## 2014-02-24 DIAGNOSIS — J069 Acute upper respiratory infection, unspecified: Secondary | ICD-10-CM | POA: Diagnosis not present

## 2014-02-24 DIAGNOSIS — M791 Myalgia: Secondary | ICD-10-CM | POA: Diagnosis present

## 2014-02-24 MED ORDER — OXYMETAZOLINE HCL 0.05 % NA SOLN
1.0000 | Freq: Once | NASAL | Status: AC
  Administered 2014-02-24: 1 via NASAL
  Filled 2014-02-24: qty 15

## 2014-02-24 NOTE — ED Notes (Signed)
Ear flushed twice. Large amount of cerumen removed. Patient reports pressure is relieved in left ear.

## 2014-02-24 NOTE — ED Notes (Signed)
Patient c/o of left ear pain, wanting ear to be flushed and re-evaluated by EDP. EDP aware, verbal order given to flush ear.

## 2014-02-24 NOTE — Discharge Instructions (Signed)

## 2014-02-24 NOTE — ED Provider Notes (Signed)
CSN: [id number]     Arrival date & time 02/24/14  [id number] History  This chart was scribed for Hilario Quarryanielle S Dlynn Ranes, MD by Modena JanskyAlbert Thayil, ED Scribe. This patient was seen in room APA03/APA03 and the patient's care was started at 7:25 AM.   Chief Complaint  Patient presents with  . Generalized Body Aches   Patient is a 33 y.o. male presenting with URI. The history is provided by the patient. No language interpreter was used.  URI Presenting symptoms: congestion and fatigue   Presenting symptoms: no fever   Severity:  Moderate Onset quality:  Sudden Duration:  2 days Timing:  Constant Progression:  Worsening Chronicity:  New Relieved by:  None tried Worsened by:  Nothing tried Ineffective treatments:  None tried Associated symptoms: headaches and myalgias    HPI Comments: Shiela MayerKittrell Blystone is a 33 y.o. male who presents to the Emergency Department complaining of a URI that started 2 days ago. He reports that he started off with some fatigue. He reports that he next day he had generalized myalgias, generalized constant moderate pressure-like headache, and nasal congestion. 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   PCP- Marge [place] Fullbright Past Medical History  Diagnosis Date  . Inguinal hernia, left   . Ear infection    Past Surgical History  Procedure Laterality Date  . Hernia repair Left [place]2010    [place], Dr. Nelda Marseilleiblet  . Prostate biopsy     History reviewed. No pertinent family history. History  Substance Use Topics  . Smoking status: Current Some Day Smoker -- 0.25 packs/day for 10 years  . Smokeless tobacco: Not on file  . Alcohol Use: Yes     Comment: rare    Review of Systems  Constitutional: Positive for fatigue. Negative for fever.  HENT: Positive for congestion.   Musculoskeletal: Positive for myalgias.  Neurological: Positive for headaches.  All other systems reviewed and are negative.   Allergies  Review of patient's  allergies indicates no known allergies.  Home Medications   Prior to Admission medications   Medication Sig Start Date End Date Taking? Authorizing Provider  acetaminophen (TYLENOL) 500 MG tablet Take 1,000 mg by mouth once as needed for moderate pain.    Historical Provider, MD  ibuprofen (ADVIL,MOTRIN) 600 MG tablet Take 1 tablet (600 mg total) by mouth 3 (three) times daily with meals. 11/16/13   Mellody DrownLauren Parker, PA-C  traMADol (ULTRAM) 50 MG tablet Take 1 tablet (50 mg total) by mouth every 6 (six) hours as needed. 03/20/13   Gilda Creasehristopher J. Pollina, MD  traMADol (ULTRAM) 50 MG tablet Take 1 tablet (50 mg total) by mouth every 6 (six) hours as needed. 11/16/13   Lauren Parker, PA-C   BP 111/66 mmHg  Pulse 75  Temp(Src) 98.2 F (36.8 C) (Oral)  Resp 18  Ht 6\' 2"  (1.88 m)  Wt 153 lb (69.4 kg)  BMI 19.64 kg/m2  SpO2 100% Physical Exam  Constitutional: He is oriented to person, place, and time. He appears well-developed and well-nourished. No distress.  HENT:  Head: Atraumatic.  Neck: Neck supple. No tracheal deviation present.  Cardiovascular: Normal rate.   Pulmonary/Chest: Effort normal. No respiratory distress.  Musculoskeletal: Normal range of motion.  Neurological: He is alert and oriented to person, place, and time.  Skin: Skin is warm and dry.  Psychiatric: He has a normal mood and affect. His behavior is normal.  Nursing note and vitals reviewed.   ED  Course  Procedures (including critical care time) DIAGNOSTIC STUDIES: Oxygen Saturation is 100% on RA, normal by my interpretation.    COORDINATION OF CARE: 7:29 AM- Pt advised of plan for treatment which includes medication and pt agrees.  Labs Review Labs Reviewed - No data to display  Imaging Review No results found.   EKG Interpretation None      MDM   Final diagnoses:  URI (upper respiratory infection)  Viral syndrome    I personally performed the services described in this documentation, which was  scribed in my presence. The recorded information has been reviewed and considered.   Hilario Quarryanielle S Udell Blasingame, MD 02/24/14 [phone]

## 2014-02-24 NOTE — ED Notes (Signed)
Pt reports nasal congestion, generalized body aches, and frequent urination. Pt denies any fever,n/v/d.

## 2014-07-24 ENCOUNTER — Emergency Department (HOSPITAL_COMMUNITY)
Admission: EM | Admit: 2014-07-24 | Discharge: 2014-07-24 | Disposition: A | Discharge status: Home / Self Care | Payer: BC Managed Care – PPO | Attending: Emergency Medicine | Admitting: Emergency Medicine

## 2014-07-24 ENCOUNTER — Emergency Department (HOSPITAL_COMMUNITY): Payer: BC Managed Care – PPO

## 2014-07-24 ENCOUNTER — Encounter (HOSPITAL_COMMUNITY): Payer: Self-pay | Admitting: Emergency Medicine

## 2014-07-24 DIAGNOSIS — E119 Type 2 diabetes mellitus without complications: Secondary | ICD-10-CM | POA: Diagnosis not present

## 2014-07-24 DIAGNOSIS — Z8669 Personal history of other diseases of the nervous system and sense organs: Secondary | ICD-10-CM | POA: Diagnosis not present

## 2014-07-24 DIAGNOSIS — Z720 Tobacco use: Secondary | ICD-10-CM | POA: Insufficient documentation

## 2014-07-24 DIAGNOSIS — Z791 Long term (current) use of non-steroidal anti-inflammatories (NSAID): Secondary | ICD-10-CM | POA: Diagnosis not present

## 2014-07-24 DIAGNOSIS — Z8719 Personal history of other diseases of the digestive system: Secondary | ICD-10-CM | POA: Insufficient documentation

## 2014-07-24 DIAGNOSIS — R1032 Left lower quadrant pain: Secondary | ICD-10-CM | POA: Diagnosis present

## 2014-07-24 LAB — COMPREHENSIVE METABOLIC PANEL
ALT: 17 U/L (ref 17–63)
AST: 22 U/L (ref 15–41)
Albumin: 4 g/dL (ref 3.5–5.0)
Alkaline Phosphatase: 67 U/L (ref 38–126)
Anion gap: 6 (ref 5–15)
BUN: 10 mg/dL (ref 6–20)
CO2: 28 mmol/L (ref 22–32)
Calcium: 9.5 mg/dL (ref 8.9–10.3)
Chloride: 104 mmol/L (ref 101–111)
Creatinine, Ser: 0.9 mg/dL (ref 0.61–1.24)
GFR calc Af Amer: >60 mL/min (ref >60)
GFR calc non Af Amer: >60 mL/min (ref >60)
Glucose, Bld: 90 mg/dL (ref 70–99)
Potassium: 4.3 mmol/L (ref 3.5–5.1)
Sodium: 138 mmol/L (ref 135–145)
Total Bilirubin: 0.7 mg/dL (ref 0.3–1.2)
Total Protein: 6.9 g/dL (ref 6.5–8.1)

## 2014-07-24 LAB — URINALYSIS, ROUTINE W REFLEX MICROSCOPIC
Bilirubin Urine: NEGATIVE
Glucose, UA: NEGATIVE mg/dL
Hgb urine dipstick: NEGATIVE
Ketones, ur: NEGATIVE mg/dL
Leukocytes, UA: NEGATIVE
Nitrite: NEGATIVE
Protein, ur: NEGATIVE mg/dL
Specific Gravity, Urine: 1.024 (ref 1.005–1.030)
Urobilinogen, UA: 0.2 mg/dL (ref 0.0–1.0)
pH: 7.5 (ref 5.0–8.0)

## 2014-07-24 LAB — CBC WITH DIFFERENTIAL/PLATELET
Basophils Absolute: 0.1 10*3/uL (ref 0.0–0.1)
Basophils Relative: 1 % (ref 0–1)
Eosinophils Absolute: 0.5 10*3/uL (ref 0.0–0.7)
Eosinophils Relative: 10 % — ABNORMAL HIGH (ref 0–5)
HCT: 40.8 % (ref 39.0–52.0)
Hemoglobin: 13.1 g/dL (ref 13.0–17.0)
Lymphocytes Relative: 35 % (ref 12–46)
Lymphs Abs: 1.7 10*3/uL (ref 0.7–4.0)
MCH: 28.5 pg (ref 26.0–34.0)
MCHC: 32.1 g/dL (ref 30.0–36.0)
MCV: 88.9 fL (ref 78.0–100.0)
Monocytes Absolute: 0.3 10*3/uL (ref 0.1–1.0)
Monocytes Relative: 7 % (ref 3–12)
Neutro Abs: 2.3 10*3/uL (ref 1.7–7.7)
Neutrophils Relative %: 47 % (ref 43–77)
Platelets: 188 10*3/uL (ref 150–400)
RBC: 4.59 MIL/uL (ref 4.22–5.81)
RDW: 12.3 % (ref 11.5–15.5)
WBC: 4.8 10*3/uL (ref 4.0–10.5)

## 2014-07-24 LAB — URINE MICROSCOPIC-ADD ON

## 2014-07-24 LAB — I-STAT CG4 LACTIC ACID, ED: Lactic Acid, Venous: 0.77 mmol/L (ref 0.5–2.0)

## 2014-07-24 MED ORDER — MOXIFLOXACIN HCL 400 MG PO TABS: 400.0000 mg | ORAL_TABLET | Freq: Every day | ORAL | Status: DC

## 2014-07-24 MED ORDER — SODIUM CHLORIDE 0.9 % IV BOLUS (SEPSIS)
1000.0000 mL | Freq: Once | INTRAVENOUS | Status: AC
  Administered 2014-07-24: 1000 mL via INTRAVENOUS

## 2014-07-24 MED ORDER — IOHEXOL 300 MG/ML  SOLN: 25.0000 mL | Freq: Once | INTRAMUSCULAR | Status: DC | PRN

## 2014-07-24 MED ORDER — IOHEXOL 300 MG/ML  SOLN
100.0000 mL | Freq: Once | INTRAMUSCULAR | Status: AC | PRN
  Administered 2014-07-24: 100 mL via INTRAVENOUS

## 2014-07-24 MED ORDER — HYDROMORPHONE HCL 1 MG/ML IJ SOLN
1.0000 mg | Freq: Once | INTRAMUSCULAR | Status: AC
  Administered 2014-07-24: 1 mg via INTRAVENOUS
  Filled 2014-07-24: qty 1

## 2014-07-24 MED ORDER — DOCUSATE SODIUM 100 MG PO CAPS: 100.0000 mg | ORAL_CAPSULE | Freq: Two times a day (BID) | ORAL | Status: DC

## 2014-07-24 MED ORDER — TRAMADOL HCL 50 MG PO TABS: 50.0000 mg | ORAL_TABLET | Freq: Four times a day (QID) | ORAL | Status: DC | PRN

## 2014-07-24 MED ORDER — POLYETHYLENE GLYCOL 3350 17 G PO PACK
17.0000 g | PACK | Freq: Every day | ORAL | Status: DC
Start: 2014-07-24 — End: 2015-10-06

## 2014-07-24 NOTE — ED Provider Notes (Signed)
CSN: [id number]     Arrival date & time 07/24/14  1156 History   First MD Initiated Contact with Patient 07/24/14 1205     Chief Complaint  Patient presents with  . Abdominal Pain     (Consider location/radiation/quality/duration/timing/severity/associated sxs/prior Treatment) HPI Patient presents to the emergency department with left lower quadrant abdominal pain that radiates to the back and left flank.  Patient states that this started somewhat last night and got worse this morning.  The patient states that he had a bowel movement yesterday.  The patient states that he did not have any blood in his stool.  Patient states he is not have any chest pain, shortness of breath, nausea, vomiting, weakness, dizziness, headache, blurred vision, diarrhea, neck pain, fever, dysuria, bloody stool or syncope.  Patient states that he did not take any medications prior to arrival. Past Medical History  Diagnosis Date  . Inguinal hernia, left   . Ear infection   . Diabetes mellitus without complication    Past Surgical History  Procedure Laterality Date  . Hernia repair Left [place]2010    [place], Dr. Nelda Marseilleiblet  . Prostate biopsy     History reviewed. No pertinent family history. History  Substance Use Topics  . Smoking status: Current Some Day Smoker -- 0.25 packs/day for 10 years  . Smokeless tobacco: Not on file  . Alcohol Use: Yes     Comment: rare    Review of Systems All other systems negative except as documented in the HPI. All pertinent positives and negatives as reviewed in the HPI.   Allergies  Review of patient's allergies indicates no known allergies.  Home Medications   Prior to Admission medications   Medication Sig Start Date End Date Taking? Authorizing Provider  acetaminophen (TYLENOL) 500 MG tablet Take 1,000 mg by mouth once as needed for moderate pain.   Yes Historical Provider, MD  ibuprofen (ADVIL,MOTRIN) 600 MG tablet Take 1 tablet (600 mg  total) by mouth 3 (three) times daily with meals. 11/16/13  Yes Mellody DrownLauren Parker, PA-C  traMADol (ULTRAM) 50 MG tablet Take 1 tablet (50 mg total) by mouth every 6 (six) hours as needed. Patient not taking: Reported on 07/24/2014 03/20/13   Gilda Creasehristopher J Pollina, MD  traMADol (ULTRAM) 50 MG tablet Take 1 tablet (50 mg total) by mouth every 6 (six) hours as needed. Patient not taking: Reported on 07/24/2014 11/16/13   Mellody DrownLauren Parker, PA-C   BP 134/79 mmHg  Temp(Src) 98.1 F (36.7 C) (Oral)  Resp 13  SpO2 98% Physical Exam  Constitutional: He is oriented to person, place, and time. He appears well-developed and well-nourished. No distress.  HENT:  Head: Normocephalic and atraumatic.  Mouth/Throat: Oropharynx is clear and moist.  Eyes: Pupils are equal, round, and reactive to light.  Neck: Normal range of motion. Neck supple.  Cardiovascular: Normal rate, regular rhythm and normal heart sounds.  Exam reveals no gallop and no friction rub.   No murmur heard. Pulmonary/Chest: Effort normal and breath sounds normal. No respiratory distress.  Abdominal: Soft. Bowel sounds are normal. He exhibits no distension. There is tenderness. There is no rebound and no guarding.  Genitourinary: Penis normal. Right testis shows no mass and no tenderness. Left testis shows no mass, no swelling and no tenderness.  Musculoskeletal: He exhibits no edema.  Neurological: He is alert and oriented to person, place, and time. He exhibits normal muscle tone. Coordination normal.  Skin: Skin is warm and dry. No rash  noted. No erythema.  Psychiatric: He has a normal mood and affect. His behavior is normal.  Nursing note and vitals reviewed.   ED Course  Procedures (including critical care time) Labs Review Labs Reviewed  CBC WITH DIFFERENTIAL/PLATELET - Abnormal; Notable for the following:    Eosinophils Relative 10 (*)    All other components within normal limits  URINALYSIS, ROUTINE W REFLEX MICROSCOPIC - Abnormal;  Notable for the following:    APPearance TURBID (*)    All other components within normal limits  COMPREHENSIVE METABOLIC PANEL  URINE MICROSCOPIC-ADD ON  I-STAT CG4 LACTIC ACID, ED    Imaging Review Ct Abdomen Pelvis W Contrast  07/24/2014   CLINICAL DATA:  Left flank, left lower quadrant, left scrotal and testicular pain.  EXAM: CT ABDOMEN AND PELVIS WITH CONTRAST  TECHNIQUE: Multidetector CT imaging of the abdomen and pelvis was performed using the standard protocol following bolus administration of intravenous contrast.  CONTRAST:  100mL OMNIPAQUE IOHEXOL 300 MG/ML  SOLN  COMPARISON:  CT abdomen and pelvis 02/12/2013.  FINDINGS: The lung bases are clear. There is no pleural or pericardial effusion. Heart size is normal.  The gallbladder, liver, spleen, adrenal glands, pancreas and kidneys all appear normal. There is no evidence of bowel obstruction.  There is a large volume of stool throughout the colon and a large stool ball in the rectum. The stomach, small bowel and appendix appear normal. There is no lymphadenopathy or fluid. Postoperative change of left inguinal hernia repair is again seen. No lytic or sclerotic bony lesion is identified. Loss of disc space height and endplate spurring are seen at L5-S1.  IMPRESSION: No acute finding abdomen or pelvis.  Prominent stool burden throughout the colon with a large stool ball in the rectum.  Degenerative disc disease L5-S1.   Electronically Signed   By: Drusilla Kannerhomas  Dalessio M.D.   On: 07/24/2014 14:14    Patient has what looks the constipation noted on CT scan, but no signs of bowel obstruction or any other abnormality.  The patient will be referred to GI also give him stool softeners, MiraLAX and with glycerin suppositories.  Patient is advised return here as needed   Charlestine NightChristopher Jesiel Garate, PA-C 07/24/14 1539  [address] AvenueChristopher Annalicia Renfrew, PA-C 07/24/14 1540  Rolland PorterMark James, MD 07/28/14 [phone]

## 2014-07-24 NOTE — ED Notes (Addendum)
Per EMS pt c/o LLQ pain that radiates to L flank and scrotum. Pain 9/10. Pt has hx of hernia and bowel obstructions. Pt noted to have some distention on LLQ.

## 2014-07-24 NOTE — Discharge Instructions (Signed)
Return here as needed.  Increase your fluid intake, rest as much as possible.  Follow-up with the GI Dr. provided

## 2014-07-24 NOTE — ED Notes (Signed)
Pt remains in xray.

## 2014-08-06 ENCOUNTER — Emergency Department (HOSPITAL_COMMUNITY): Payer: BC Managed Care – PPO

## 2014-08-06 ENCOUNTER — Emergency Department (HOSPITAL_COMMUNITY)
Admission: EM | Admit: 2014-08-06 | Discharge: 2014-08-07 | Disposition: A | Discharge status: Home / Self Care | Payer: BC Managed Care – PPO | Attending: Emergency Medicine | Admitting: Emergency Medicine

## 2014-08-06 ENCOUNTER — Encounter (HOSPITAL_COMMUNITY): Payer: Self-pay

## 2014-08-06 DIAGNOSIS — Z79899 Other long term (current) drug therapy: Secondary | ICD-10-CM | POA: Diagnosis not present

## 2014-08-06 DIAGNOSIS — K5900 Constipation, unspecified: Secondary | ICD-10-CM | POA: Insufficient documentation

## 2014-08-06 DIAGNOSIS — Z720 Tobacco use: Secondary | ICD-10-CM | POA: Diagnosis not present

## 2014-08-06 DIAGNOSIS — Z8669 Personal history of other diseases of the nervous system and sense organs: Secondary | ICD-10-CM | POA: Insufficient documentation

## 2014-08-06 DIAGNOSIS — R140 Abdominal distension (gaseous): Secondary | ICD-10-CM | POA: Diagnosis not present

## 2014-08-06 DIAGNOSIS — Z792 Long term (current) use of antibiotics: Secondary | ICD-10-CM | POA: Insufficient documentation

## 2014-08-06 DIAGNOSIS — R1032 Left lower quadrant pain: Secondary | ICD-10-CM | POA: Insufficient documentation

## 2014-08-06 DIAGNOSIS — R110 Nausea: Secondary | ICD-10-CM | POA: Diagnosis not present

## 2014-08-06 DIAGNOSIS — R109 Unspecified abdominal pain: Secondary | ICD-10-CM

## 2014-08-06 LAB — CBC WITH DIFFERENTIAL/PLATELET
BASOS ABS: 0 10*3/uL (ref 0.0–0.1)
BASOS PCT: 0 % (ref 0–1)
Eosinophils Absolute: 0.5 10*3/uL (ref 0.0–0.7)
Eosinophils Relative: 6 % — ABNORMAL HIGH (ref 0–5)
HCT: 40.1 % (ref 39.0–52.0)
Hemoglobin: 13.1 g/dL (ref 13.0–17.0)
LYMPHS ABS: 2.6 10*3/uL (ref 0.7–4.0)
Lymphocytes Relative: 35 % (ref 12–46)
MCH: 29.1 pg (ref 26.0–34.0)
MCHC: 32.7 g/dL (ref 30.0–36.0)
MCV: 89.1 fL (ref 78.0–100.0)
Monocytes Absolute: 0.5 10*3/uL (ref 0.1–1.0)
Monocytes Relative: 6 % (ref 3–12)
NEUTROS ABS: 3.9 10*3/uL (ref 1.7–7.7)
NEUTROS PCT: 53 % (ref 43–77)
Platelets: 193 10*3/uL (ref 150–400)
RBC: 4.5 MIL/uL (ref 4.22–5.81)
RDW: 12.2 % (ref 11.5–15.5)
WBC: 7.5 10*3/uL (ref 4.0–10.5)

## 2014-08-06 LAB — COMPREHENSIVE METABOLIC PANEL
ALT: 17 U/L (ref 17–63)
AST: 24 U/L (ref 15–41)
Albumin: 4.4 g/dL (ref 3.5–5.0)
Alkaline Phosphatase: 58 U/L (ref 38–126)
Anion gap: 6 (ref 5–15)
BILIRUBIN TOTAL: 0.6 mg/dL (ref 0.3–1.2)
BUN: 12 mg/dL (ref 6–20)
CALCIUM: 9 mg/dL (ref 8.9–10.3)
CO2: 26 mmol/L (ref 22–32)
Chloride: 104 mmol/L (ref 101–111)
Creatinine, Ser: 0.88 mg/dL (ref 0.61–1.24)
GFR calc Af Amer: >60 mL/min (ref >60)
GFR calc non Af Amer: >60 mL/min (ref >60)
GLUCOSE: 93 mg/dL (ref 65–99)
Potassium: 3.6 mmol/L (ref 3.5–5.1)
Sodium: 136 mmol/L (ref 135–145)
Total Protein: 7.2 g/dL (ref 6.5–8.1)

## 2014-08-06 LAB — LIPASE, BLOOD: LIPASE: 35 U/L (ref 22–51)

## 2014-08-06 MED ORDER — ONDANSETRON HCL 4 MG/2ML IJ SOLN
4.0000 mg | Freq: Once | INTRAMUSCULAR | Status: AC
  Administered 2014-08-06: 4 mg via INTRAVENOUS
  Filled 2014-08-06: qty 2

## 2014-08-06 MED ORDER — MORPHINE SULFATE 4 MG/ML IJ SOLN
4.0000 mg | Freq: Once | INTRAMUSCULAR | Status: AC
  Administered 2014-08-06: 4 mg via INTRAVENOUS
  Filled 2014-08-06: qty 1

## 2014-08-06 NOTE — ED Notes (Signed)
Abdominal pain, swollen in mid abdomen and it is real hard per pt.  Patient states that he has not had a bowel movement in a couple of days. States that he has not been nauseated or had any vomiting.

## 2014-08-06 NOTE — ED Notes (Signed)
Pt. Reports abdominal pain and swelling. Pt. Denies nausea/vomiting diarrhea. Pt. Reports last bowel movement 2 days ago. Pt. Reports pain to left side of scrotum starting tonight after he showered. Pt. Reports he feels as though the vein in his groin is swollen.

## 2014-08-07 MED ORDER — FENTANYL CITRATE (PF) 100 MCG/2ML IJ SOLN
75.0000 ug | Freq: Once | INTRAMUSCULAR | Status: AC
  Administered 2014-08-07: 75 ug via INTRAVENOUS

## 2014-08-07 MED ORDER — DOCUSATE SODIUM 100 MG PO CAPS: 100.0000 mg | ORAL_CAPSULE | Freq: Two times a day (BID) | ORAL | Status: DC | PRN

## 2014-08-07 MED ORDER — FENTANYL CITRATE (PF) 100 MCG/2ML IJ SOLN
INTRAMUSCULAR | Status: AC
  Filled 2014-08-07: qty 2

## 2014-08-07 MED ORDER — HYDROCODONE-ACETAMINOPHEN 5-325 MG PO TABS
1.0000 | ORAL_TABLET | ORAL | Status: DC | PRN
Start: 2014-08-07 — End: 2015-10-06

## 2014-08-07 NOTE — ED Provider Notes (Signed)
CSN: [id number]     Arrival date & time 08/06/14  2115 History   First MD Initiated Contact with Patient 08/06/14 2234     Chief Complaint  Patient presents with  . Abdominal Pain     (Consider location/radiation/quality/duration/timing/severity/associated sxs/prior Treatment) The history is provided by the patient and the spouse.   Isaiah Heath is a 34 y.o. male with a history of chronic intermittent left lower quadrant pain which at times radiates into his left lower back and this evening flared up with radiation into his left groin and scrotum in association with a hard "knot" and distention of his abdomen several hours before arrival here.  He reports nausea without emesis.  He has a history of left inguinal hernia repair using mesh by a [place] in [place] and he feels his pain is a complication or a worsening hernia again at this site.  He was seen for this same problem here 2 weeks ago at which time a CT scan performed was unremarkable except for constipation.  He reports taking the stool softeners prescribed and has regular stools since that visit, but now, no bm in the past 2 days.  He denies rectal bleeding, rectal pain, diarrhea, vomiting, fevers, chills, dysuria.  He has been evaluated in the past for this problem by Dr. Lindie SpruceWyatt of [facility] in [place] but the evaluation became delayed due to the patients inability to obtain his surgical records as the original surgeon in [place] is no longer practicing.         Past Medical History  Diagnosis Date  . Inguinal hernia, left   . Ear infection    Past Surgical History  Procedure Laterality Date  . Hernia repair Left [place]2010    [place], Dr. Nelda Marseilleiblet  . Prostate biopsy     No family history on file. History  Substance Use Topics  . Smoking status: Current Some Day Smoker -- 0.25 packs/day for 10 years  . Smokeless tobacco: Not on file  . Alcohol Use: Yes     Comment: rare    Review of Systems  Constitutional:  Negative for fever and chills.  HENT: Negative for congestion and sore throat.   Eyes: Negative.   Respiratory: Negative for chest tightness and shortness of breath.   Cardiovascular: Negative for chest pain.  Gastrointestinal: Positive for nausea and constipation. Negative for vomiting, abdominal pain, diarrhea, blood in stool and rectal pain.  Genitourinary: Negative.   Musculoskeletal: Negative for joint swelling, arthralgias and neck pain.  Skin: Negative.  Negative for rash and wound.  Neurological: Negative for dizziness, weakness, light-headedness, numbness and headaches.  Psychiatric/Behavioral: Negative.       Allergies  Review of patient's allergies indicates no known allergies.  Home Medications   Prior to Admission medications   Medication Sig Start Date End Date Taking? Authorizing Provider  acetaminophen (TYLENOL) 500 MG tablet Take 1,000 mg by mouth every 8 (eight) hours as needed for mild pain or moderate pain.     Historical Provider, MD  docusate sodium (COLACE) 100 MG capsule Take 1-2 capsules (100-200 mg total) by mouth 2 (two) times daily as needed for mild constipation. 08/07/14   Burgess AmorJulie Dylyn Mclaren, PA-C  HYDROcodone-acetaminophen (NORCO/VICODIN) 5-325 MG per tablet Take 1 tablet by mouth every 4 (four) hours as needed. 08/07/14   Burgess AmorJulie Maddix Kliewer, PA-C  ibuprofen (ADVIL,MOTRIN) 600 MG tablet Take 1 tablet (600 mg total) by mouth 3 (three) times daily with meals. Patient not taking: Reported on 08/06/2014 11/16/13  Mellody Drown, PA-C  moxifloxacin (AVELOX) 400 MG tablet Take 1 tablet (400 mg total) by mouth daily at 8 pm. Patient not taking: Reported on 08/06/2014 07/24/14   Charlestine Night, PA-C  polyethylene glycol [facility]) packet Take 17 g by mouth daily. Patient not taking: Reported on 08/06/2014 07/24/14   Charlestine Night, PA-C  traMADol (ULTRAM) 50 MG tablet Take 1 tablet (50 mg total) by mouth every 6 (six) hours as needed. Patient not taking: Reported on 08/06/2014  07/24/14   Charlestine Night, PA-C   BP 120/86 mmHg  Pulse 75  Temp(Src) 98.3 F (36.8 C) (Oral)  Resp 16  Ht  (1.88 m)  Wt 144 lb 4.8 oz (65.454 kg)  BMI 18.52 kg/m2  SpO2 100% Physical Exam  Constitutional: He appears well-developed and well-nourished.  HENT:  Head: Normocephalic and atraumatic.  Eyes: Conjunctivae are normal.  Neck: Normal range of motion.  Cardiovascular: Normal rate, regular rhythm, normal heart sounds and intact distal pulses.   Pulmonary/Chest: Effort normal and breath sounds normal. He has no wheezes.  Abdominal: Soft. Bowel sounds are normal. He exhibits distension. There is tenderness in the left lower quadrant. There is guarding. There is no CVA tenderness. No hernia. Hernia confirmed negative in the left inguinal area.  Linear induration left lower pelvis c/w surgical scarring.  Genitourinary: Left testis shows no mass and no tenderness. Circumcised.  No obvious hernias present.  Scrotum soft, nontender.  Musculoskeletal: Normal range of motion.  Neurological: He is alert.  Skin: Skin is warm and dry.  Psychiatric: He has a normal mood and affect.  Nursing note and vitals reviewed.  Chaperone was present during exam.  ED Course  Procedures (including critical care time) Labs Review Labs Reviewed  CBC WITH DIFFERENTIAL/PLATELET - Abnormal; Notable for the following:    Eosinophils Relative 6 (*)    All other components within normal limits  COMPREHENSIVE METABOLIC PANEL  LIPASE, BLOOD    Imaging Review Dg Abd Acute W/chest  08/07/2014   CLINICAL DATA:  Left-sided abdominal pain for a few days.  EXAM: DG ABDOMEN ACUTE W/ 1V CHEST  COMPARISON:  CT abdomen/ pelvis 07/24/2014  FINDINGS: The cardiomediastinal contours are normal. The lungs are hyperinflated but clear. There is no free intra-abdominal air. No dilated bowel loops to suggest obstruction. Small-moderate volume of stool throughout the colon. No radiopaque calculi. There are  phleboliths in the pelvis. No acute osseous abnormalities are seen.  IMPRESSION: Normal bowel gas pattern.   Electronically Signed   By: Rubye Oaks M.D.   On: 08/07/2014 01:12     EKG Interpretation None      MDM   Final diagnoses:  Abdominal pain    Patients labs and/or radiological studies were reviewed and considered during the medical decision making and disposition process.  Results were also discussed with patient.  Pt with chronic intermittent LLQ pain since hernia surgery in 2010.  Question episodic obstructions from adhesions vs mesh surgery complications.  No obstruction on exam tonight or per xray.  Pt was encouraged general surgery f/u for further evaluation.  Suggested contact Dr. Lindie Spruce for further evaluation.  Also given referral to Dr Lovell Sheehan as pt expressed interest in more local physician. Hydrocodone prescribed, colace.  Discussed to use caution with pain med as it can cause constipation.  The patient appears reasonably screened and/or stabilized for discharge and I doubt any other medical condition or other [facility] requiring further screening, evaluation, or treatment in the ED at this time prior  to discharge.     Burgess AmorJulie Blaise Grieshaber, PA-C 08/07/14 0149  Zadie Rhineonald Wickline, MD 08/07/14 [phone]

## 2014-08-07 NOTE — Discharge Instructions (Signed)

## 2014-08-07 NOTE — ED Notes (Signed)
Pt. Appears very uncomfortable. Pt. In bed in fetal position. EDP notified. Orders given for pain medication.

## 2015-10-06 ENCOUNTER — Emergency Department (HOSPITAL_COMMUNITY)
Admission: EM | Admit: 2015-10-06 | Discharge: 2015-10-06 | Disposition: A | Discharge status: Home / Self Care | Payer: BC Managed Care – PPO | Attending: Emergency Medicine | Admitting: Emergency Medicine

## 2015-10-06 ENCOUNTER — Encounter (HOSPITAL_COMMUNITY): Payer: Self-pay | Admitting: Emergency Medicine

## 2015-10-06 ENCOUNTER — Emergency Department (HOSPITAL_COMMUNITY): Payer: BC Managed Care – PPO

## 2015-10-06 DIAGNOSIS — Y939 Activity, unspecified: Secondary | ICD-10-CM | POA: Diagnosis not present

## 2015-10-06 DIAGNOSIS — F17200 Nicotine dependence, unspecified, uncomplicated: Secondary | ICD-10-CM | POA: Insufficient documentation

## 2015-10-06 DIAGNOSIS — T148XXA Other injury of unspecified body region, initial encounter: Secondary | ICD-10-CM

## 2015-10-06 DIAGNOSIS — Y929 Unspecified place or not applicable: Secondary | ICD-10-CM | POA: Insufficient documentation

## 2015-10-06 DIAGNOSIS — S39011A Strain of muscle, fascia and tendon of abdomen, initial encounter: Secondary | ICD-10-CM | POA: Insufficient documentation

## 2015-10-06 DIAGNOSIS — X58XXXA Exposure to other specified factors, initial encounter: Secondary | ICD-10-CM | POA: Insufficient documentation

## 2015-10-06 DIAGNOSIS — S3991XA Unspecified injury of abdomen, initial encounter: Secondary | ICD-10-CM | POA: Diagnosis present

## 2015-10-06 DIAGNOSIS — Y999 Unspecified external cause status: Secondary | ICD-10-CM | POA: Diagnosis not present

## 2015-10-06 LAB — BASIC METABOLIC PANEL
Anion gap: 4 — ABNORMAL LOW (ref 5–15)
BUN: 7 mg/dL (ref 6–20)
CALCIUM: 9 mg/dL (ref 8.9–10.3)
CO2: 26 mmol/L (ref 22–32)
Chloride: 108 mmol/L (ref 101–111)
Creatinine, Ser: 0.81 mg/dL (ref 0.61–1.24)
GFR calc Af Amer: >60 mL/min (ref >60)
GFR calc non Af Amer: >60 mL/min (ref >60)
GLUCOSE: 100 mg/dL — AB (ref 65–99)
Potassium: 3.9 mmol/L (ref 3.5–5.1)
Sodium: 138 mmol/L (ref 135–145)

## 2015-10-06 LAB — CBC
HCT: 39.7 % (ref 39.0–52.0)
Hemoglobin: 12.7 g/dL — ABNORMAL LOW (ref 13.0–17.0)
MCH: 28.9 pg (ref 26.0–34.0)
MCHC: 32 g/dL (ref 30.0–36.0)
MCV: 90.2 fL (ref 78.0–100.0)
Platelets: 172 10*3/uL (ref 150–400)
RBC: 4.4 MIL/uL (ref 4.22–5.81)
RDW: 12.1 % (ref 11.5–15.5)
WBC: 5.3 10*3/uL (ref 4.0–10.5)

## 2015-10-06 LAB — URINALYSIS, ROUTINE W REFLEX MICROSCOPIC
Bilirubin Urine: NEGATIVE
Glucose, UA: NEGATIVE mg/dL
Hgb urine dipstick: NEGATIVE
Ketones, ur: NEGATIVE mg/dL
Leukocytes, UA: NEGATIVE
Nitrite: NEGATIVE
Protein, ur: NEGATIVE mg/dL
SPECIFIC GRAVITY, URINE: 1.013 (ref 1.005–1.030)
pH: 6 (ref 5.0–8.0)

## 2015-10-06 MED ORDER — KETOROLAC TROMETHAMINE 60 MG/2ML IM SOLN
60.0000 mg | Freq: Once | INTRAMUSCULAR | Status: DC
Start: 2015-10-06 — End: 2015-10-06

## 2015-10-06 MED ORDER — DIAZEPAM 5 MG PO TABS: 5.0000 mg | ORAL_TABLET | Freq: Two times a day (BID) | ORAL | Status: DC

## 2015-10-06 MED ORDER — NAPROXEN 500 MG PO TABS: 500.0000 mg | ORAL_TABLET | Freq: Two times a day (BID) | ORAL | Status: DC

## 2015-10-06 MED ORDER — DIAZEPAM 5 MG PO TABS
5.0000 mg | ORAL_TABLET | Freq: Once | ORAL | Status: AC
  Administered 2015-10-06: 5 mg via ORAL
  Filled 2015-10-06: qty 1

## 2015-10-06 MED ORDER — KETOROLAC TROMETHAMINE 30 MG/ML IJ SOLN
30.0000 mg | Freq: Once | INTRAMUSCULAR | Status: AC
  Administered 2015-10-06: 30 mg via INTRAVENOUS
  Filled 2015-10-06: qty 1

## 2015-10-06 NOTE — ED Notes (Signed)
Pt reports hx of hernia to left groin. Pt reports hernia repaid. Pain onset five days ago, worse yesterday. Unable to bear weight. Pt reports numbness to left thigh.

## 2015-10-06 NOTE — ED Provider Notes (Signed)
CSN: [id number]     Arrival date & time 10/06/15  4098 History   First MD Initiated Contact with Patient 10/06/15 1011     Chief Complaint  Patient presents with  . Hernia   HPI  Isaiah Heath is a 35 y.o. male PMH significant for left inguinal hernia repair (2010) presenting with a 5 day history of abdominal pain that worsened yesterday.  He describes the pain as 10/10 pain scale, constant, worse when he moves his left leg up but improves when he relaxes his leg down, achy, LLQ in location, radiating to his penis intermittently. He endorses one episode of emesis this morning and constipation over the last 2 days. He denies fevers, chills, changes in urinary habits or appetite, penile drainage.  Past Medical History  Diagnosis Date  . Inguinal hernia, left   . Ear infection    Past Surgical History  Procedure Laterality Date  . Hernia repair Left [place], [place] [facility], Dr. Nelda Marseille  . Prostate biopsy     No family history on file. Social History  Substance Use Topics  . Smoking status: Current Some Day Smoker -- 0.25 packs/day for 10 years  . Smokeless tobacco: None  . Alcohol Use: Yes     Comment: rare    Review of Systems  Ten systems are reviewed and are negative for acute change except as noted in the HPI  Allergies  Review of patient's allergies indicates no known allergies.  Home Medications   Prior to Admission medications   Medication Sig Start Date End Date Taking? Authorizing Provider  acetaminophen (TYLENOL) 500 MG tablet Take 1,000 mg by mouth every 8 (eight) hours as needed for mild pain or moderate pain.     Historical Provider, MD  docusate sodium (COLACE) 100 MG capsule Take 1-2 capsules (100-200 mg total) by mouth 2 (two) times daily as needed for mild constipation. 08/07/14   Burgess Amor, PA-C  HYDROcodone-acetaminophen (NORCO/VICODIN) 5-325 MG per tablet Take 1 tablet by mouth every 4 (four) hours as needed. 08/07/14   Burgess Amor, PA-C    ibuprofen (ADVIL,MOTRIN) 600 MG tablet Take 1 tablet (600 mg total) by mouth 3 (three) times daily with meals. Patient not taking: Reported on 08/06/2014 11/16/13   Mellody Drown, PA-C  moxifloxacin (AVELOX) 400 MG tablet Take 1 tablet (400 mg total) by mouth daily at 8 pm. Patient not taking: Reported on 08/06/2014 07/24/14   Charlestine Night, PA-C  polyethylene glycol [facility]) packet Take 17 g by mouth daily. Patient not taking: Reported on 08/06/2014 07/24/14   Charlestine Night, PA-C  traMADol (ULTRAM) 50 MG tablet Take 1 tablet (50 mg total) by mouth every 6 (six) hours as needed. Patient not taking: Reported on 08/06/2014 07/24/14   Charlestine Night, PA-C   BP 123/77 mmHg  Pulse 68  Temp(Src) 97.8 F (36.6 C) (Oral)  Resp 16  SpO2 100% Physical Exam  Constitutional: He appears well-developed and well-nourished. No distress.  HENT:  Head: Normocephalic and atraumatic.  Mouth/Throat: Oropharynx is clear and moist. No oropharyngeal exudate.  Eyes: Conjunctivae are normal. Pupils are equal, round, and reactive to light. Right eye exhibits no discharge. Left eye exhibits no discharge. No scleral icterus.  Neck: No tracheal deviation present.  Cardiovascular: Normal rate, regular rhythm, normal heart sounds and intact distal pulses.  Exam reveals no gallop and no friction rub.   No murmur heard. Pulmonary/Chest: Effort normal and breath sounds normal. No respiratory distress. He has no wheezes.  He has no rales. He exhibits no tenderness.  Abdominal: Soft. Bowel sounds are normal. He exhibits no distension and no mass. There is no tenderness. There is no rebound and no guarding.  Genitourinary: Testes normal. Cremasteric reflex is present.  Chaperoned penile and scrotal exam unremarkable.  Musculoskeletal: He exhibits no edema.       Left hip: He exhibits tenderness.       Legs: Lymphadenopathy:    He has no cervical adenopathy.  Neurological: He is alert. Coordination normal.  Skin:  Skin is warm and dry. No rash noted. He is not diaphoretic. No erythema.  Psychiatric: He has a normal mood and affect. His behavior is normal.  Nursing note and vitals reviewed.   ED Course  Procedures (including critical care time) Labs Review Labs Reviewed  CBC - Abnormal; Notable for the following:    Hemoglobin 12.7 (*)    All other components within normal limits  BASIC METABOLIC PANEL - Abnormal; Notable for the following:    Glucose, Bld 100 (*)    Anion gap 4 (*)    All other components within normal limits  URINALYSIS, ROUTINE W REFLEX MICROSCOPIC (NOT AT [facility])    Imaging Review Ct Renal Stone Study  10/06/2015  CLINICAL DATA:  Left flank pain since last Thursday. EXAM: CT ABDOMEN AND PELVIS WITHOUT CONTRAST TECHNIQUE: Multidetector CT imaging of the abdomen and pelvis was performed following the standard protocol without IV contrast. COMPARISON:  CT scan 07/24/2014 FINDINGS: Examination is limited by patient motion and artifact from the arms. Lower chest: The lung bases are minimally visualized. Pleural effusion. Hepatobiliary: No gross abnormality. Exam limited by significant artifact and incomplete imaging. Pancreas: Grossly normal. Spleen: Grossly normal. Adrenals/Urinary Tract: No obvious adrenal lesions. No renal calculi or obvious hydronephrosis. No evidence of obstructing ureteral calculi or bladder calculi. Stomach/Bowel: The stomach, duodenum, small bowel and colon are grossly normal without oral contrast. No inflammatory changes, mass lesions or obstructive findings. Vascular/Lymphatic: The aorta is normal in caliber. No atherosclerotic calcifications. No obvious abdominal/pelvic lymphadenopathy. Other: The bladder is grossly normal. Known pelvic mass or free pelvic fluid collections. Stable scattered phlebolith thick calcifications. No inguinal mass or hernia. Musculoskeletal: No significant bony findings. IMPRESSION: 1. Limited examination due to patient motion, artifact  from the arms and lack of IV and oral contrast. 2. No obvious/gross abnormality in the abdomen/pelvis. 3. No definite renal or obstructing ureteral calculi or bladder calculi. Electronically Signed   By: Rudie MeyerP.  Gallerani M.D.   On: 10/06/2015 11:54   I have personally reviewed and evaluated these images and lab results as part of my medical decision-making.  MDM   Final diagnoses:  Muscle strain   Patient nontoxic-appearing, vital signs stable. Most likely etiologies for patient's symptoms are kidney stone versus hernia versus musculoskeletal. Less likely testicular etiology. Patient states his pain has significantly improved after muscle relaxer and anti-inflammatory. Patient may be safely discharged home. Discussed reasons for return. Patient to follow-up with primary care provider within one week. Patient in understanding and agreement with the plan.  Melton KrebsSamantha Nicole Carrisa Keller, PA-C 10/06/15 1335  Derwood KaplanAnkit Nanavati, MD 10/09/15 1616

## 2015-10-06 NOTE — Discharge Instructions (Signed)
Isaiah Heath,  Nice meeting you! Please follow-up with your primary care provider and [facility] (as needed). Return to the emergency department if you develop fevers, chills, increased nausea/vomiting, inability to walk, diarrhea or inability to have a bowel movement, new/worsening symptoms. Feel better soon!  S. Lane HackerNicole Damonta Cossey, PA-C

## 2016-01-26 ENCOUNTER — Encounter (HOSPITAL_COMMUNITY): Payer: Self-pay

## 2016-01-26 ENCOUNTER — Emergency Department (HOSPITAL_COMMUNITY)
Admission: EM | Admit: 2016-01-26 | Discharge: 2016-01-26 | Disposition: A | Discharge status: Home / Self Care | Payer: BC Managed Care – PPO | Attending: Emergency Medicine | Admitting: Emergency Medicine

## 2016-01-26 DIAGNOSIS — R1032 Left lower quadrant pain: Secondary | ICD-10-CM | POA: Insufficient documentation

## 2016-01-26 DIAGNOSIS — F17200 Nicotine dependence, unspecified, uncomplicated: Secondary | ICD-10-CM | POA: Diagnosis not present

## 2016-01-26 LAB — COMPREHENSIVE METABOLIC PANEL
ALBUMIN: 4.5 g/dL (ref 3.5–5.0)
ALT: 13 U/L — AB (ref 17–63)
ANION GAP: 3 — AB (ref 5–15)
AST: 20 U/L (ref 15–41)
Alkaline Phosphatase: 58 U/L (ref 38–126)
BUN: 7 mg/dL (ref 6–20)
CHLORIDE: 111 mmol/L (ref 101–111)
CO2: 24 mmol/L (ref 22–32)
Calcium: 9.1 mg/dL (ref 8.9–10.3)
Creatinine, Ser: 0.76 mg/dL (ref 0.61–1.24)
GFR calc non Af Amer: >60 mL/min (ref >60)
GLUCOSE: 96 mg/dL (ref 65–99)
Potassium: 3.8 mmol/L (ref 3.5–5.1)
SODIUM: 138 mmol/L (ref 135–145)
Total Bilirubin: 0.8 mg/dL (ref 0.3–1.2)
Total Protein: 7.7 g/dL (ref 6.5–8.1)

## 2016-01-26 LAB — I-STAT CG4 LACTIC ACID, ED: Lactic Acid, Venous: 1.26 mmol/L (ref 0.5–1.9)

## 2016-01-26 LAB — URINALYSIS, ROUTINE W REFLEX MICROSCOPIC
BILIRUBIN URINE: NEGATIVE
GLUCOSE, UA: NEGATIVE mg/dL
HGB URINE DIPSTICK: NEGATIVE
Ketones, ur: NEGATIVE mg/dL
Leukocytes, UA: NEGATIVE
Nitrite: NEGATIVE
PH: 7.5 (ref 5.0–8.0)
Protein, ur: NEGATIVE mg/dL
SPECIFIC GRAVITY, URINE: 1.011 (ref 1.005–1.030)

## 2016-01-26 LAB — CBC
HCT: 43 % (ref 39.0–52.0)
HEMOGLOBIN: 13.9 g/dL (ref 13.0–17.0)
MCH: 29.1 pg (ref 26.0–34.0)
MCHC: 32.3 g/dL (ref 30.0–36.0)
MCV: 90.1 fL (ref 78.0–100.0)
Platelets: 188 10*3/uL (ref 150–400)
RBC: 4.77 MIL/uL (ref 4.22–5.81)
RDW: 12.5 % (ref 11.5–15.5)
WBC: 3.9 10*3/uL — ABNORMAL LOW (ref 4.0–10.5)

## 2016-01-26 LAB — LIPASE, BLOOD: LIPASE: 26 U/L (ref 11–51)

## 2016-01-26 MED ORDER — ONDANSETRON HCL 4 MG/2ML IJ SOLN
4.0000 mg | Freq: Once | INTRAMUSCULAR | Status: AC | PRN
  Administered 2016-01-26: 4 mg via INTRAVENOUS
  Filled 2016-01-26: qty 2

## 2016-01-26 MED ORDER — KETOROLAC TROMETHAMINE 30 MG/ML IJ SOLN
INTRAMUSCULAR | Status: AC
  Filled 2016-01-26: qty 1

## 2016-01-26 MED ORDER — ACETAMINOPHEN 500 MG PO TABS
1000.0000 mg | ORAL_TABLET | Freq: Once | ORAL | Status: AC
  Administered 2016-01-26: 1000 mg via ORAL
  Filled 2016-01-26: qty 2

## 2016-01-26 MED ORDER — KETOROLAC TROMETHAMINE 30 MG/ML IJ SOLN
30.0000 mg | Freq: Once | INTRAMUSCULAR | Status: AC
  Administered 2016-01-26: 30 mg via INTRAVENOUS

## 2016-01-26 NOTE — ED Notes (Signed)
Bed: [id number] Expected date:  Expected time:  Means of arrival:  Comments: EMS-groin pain

## 2016-01-26 NOTE — ED Triage Notes (Addendum)
BIB EMS w/ c/o 10/10 LLQ Abd pain w/ radiation to left groin x1 week. Pt reports blood tinged urination yesterday. Pt has hx of abd hernia rupture in 09' that leaked bile into scrotum. Pt arrives A+OX4, grimacing in pain.   EMS Vitals BP 130/72 P 74 SPO2 98% CBG 118

## 2016-01-26 NOTE — ED Provider Notes (Addendum)
[facility]-EMERGENCY DEPT Provider Note   CSN: [id number] Arrival date & time: 01/26/16  0950     History   Chief Complaint Chief Complaint  Patient presents with  . Abdominal Pain  . Groin Pain    HPI Isaiah Heath is a 35 y.o. male.  35 year old male with previous inguinal hernia repair who presents with lower abdominal pain. Patient reports 1 week of left lower quadrant abdominal pain. He he has had the exact same pain previously for the past several years ever since his hernia repair. Nothing makes the pain better or worse. He has had mild nausea and sweats but no vomiting, diarrhea, or urinary symptoms. He does report urinating frequently at night. Last bowel movement was yesterday and was normal. He has been taking MiraLAX.   The history is provided by the patient.  Abdominal Pain    Groin Pain  Associated symptoms include abdominal pain.    Past Medical History:  Diagnosis Date  . Ear infection   . Inguinal hernia, left     There are no active problems to display for this patient.   Past Surgical History:  Procedure Laterality Date  . HERNIA REPAIR Left [place]2010   [place] [place] [facility], Dr. Nelda Marseilleiblet  . PROSTATE BIOPSY         Home Medications    Prior to Admission medications   Medication Sig Start Date End Date Taking? Authorizing Provider  ibuprofen (ADVIL,MOTRIN) 200 MG tablet Take 800 mg by mouth every 6 (six) hours as needed (Pain).   Yes Historical Provider, MD  diazepam (VALIUM) 5 MG tablet Take 1 tablet (5 mg total) by mouth 2 (two) times daily. Patient not taking: Reported on 01/26/2016 10/06/15   Melton KrebsSamantha Nicole Riley, PA-C  naproxen (NAPROSYN) 500 MG tablet Take 1 tablet (500 mg total) by mouth 2 (two) times daily. Patient not taking: Reported on 01/26/2016 10/06/15   Melton KrebsSamantha Nicole Riley, PA-C    Family History History reviewed. No pertinent family history.  Social History Social History  Substance Use Topics  . Smoking status:  Current Some Day Smoker    Packs/day: 0.25    Years: 10.00  . Smokeless tobacco: Not on file  . Alcohol use Yes     Comment: rare     Allergies   Patient has no known allergies.   Review of Systems Review of Systems  Gastrointestinal: Positive for abdominal pain.   10 Systems reviewed and are negative for acute change except as noted in the HPI.   Physical Exam Updated Vital Signs BP 119/76 (BP Location: Left Arm)   Pulse 60   Temp 98 F (36.7 C) (Oral)   Resp 18   SpO2 98%   Physical Exam  Constitutional: He is oriented to person, place, and time. He appears well-developed and well-nourished. No distress.  HENT:  Head: Normocephalic and atraumatic.  Moist mucous membranes  Eyes: Conjunctivae are normal. Pupils are equal, round, and reactive to light.  Neck: Neck supple.  Cardiovascular: Normal rate, regular rhythm and normal heart sounds.   No murmur heard. Pulmonary/Chest: Effort normal and breath sounds normal.  Abdominal: Soft. Bowel sounds are normal. He exhibits no distension. There is no tenderness.  LLQ tenderness with no bulge or abnormality at surgical scar site  Musculoskeletal: He exhibits no edema.  Neurological: He is alert and oriented to person, place, and time.  Fluent speech  Skin: Skin is warm and dry.  Psychiatric: He has a normal mood and affect. Judgment normal.  Nursing note and vitals reviewed.    ED Treatments / Results  Labs (all labs ordered are listed, but only abnormal results are displayed) Labs Reviewed  COMPREHENSIVE METABOLIC PANEL - Abnormal; Notable for the following:       Result Value   ALT 13 (*)    Anion gap 3 (*)    All other components within normal limits  CBC - Abnormal; Notable for the following:    WBC 3.9 (*)    All other components within normal limits  LIPASE, BLOOD  URINALYSIS, ROUTINE W REFLEX MICROSCOPIC (NOT AT [facility])  I-STAT CG4 LACTIC ACID, ED    EKG  EKG Interpretation None        Radiology No results found.  Procedures Procedures (including critical care time)  Medications Ordered in ED Medications  ondansetron (ZOFRAN) injection 4 mg (4 mg Intravenous Given 01/26/16 1010)  ketorolac (TORADOL) 30 MG/ML injection 30 mg (30 mg Intravenous Given 01/26/16 1128)     Initial Impression / Assessment and Plan / ED Course  I have reviewed the triage vital signs and the nursing notes.  Pertinent labs that were available during my care of the patient were reviewed by me and considered in my medical decision making (see chart for details).  Clinical Course    Pt w/ 1 week of persistent LLQ pain. Chart review over the past couple of years shows that he has been evaluated several times for the same sx. Well-appearing with normal vital signs on exam. No abnormalities previous surgery site. I reviewed his chart which shows previous imaging including CT and ultrasound which were unremarkable. Previous note from general surgery, Dr. Lindie SpruceWyatt, showed that the patient was supposed to follow up with general surgery and never did. He stated it was because he could not obtain his previous surgery records; I explained that he would still need to contact surgery for clinic appt.  Given the chronicity of his pain, normal vital signs, and unremarkable above lab work, I do not feel he has an acute abdominal process such as obstruction or appendicitis. Gave toradol for pain, patient continued to complain of pain but given chronic nature of sx I did not provide with narcotics. I have emphasized the importance of follow-up with general surgery in the clinic. Return precautions reviewed.  After the patient was discharged, I was called back to the room because the patient stated that he was still in pain. He requested something stronger than Toradol. He said he received percocet previously. I explained that I did not provide narcotics for chronic pain conditions and that he would have to follow up  with general surgery to discuss the next step in management. Patient became angry that I was not treating his pain. I explained that this narcotic policy is universal and is not aimed specifically at him, rather it is a practice pattern for all of my patients. I offered tylenol and emphasized importance of GS f/u.   Final Clinical Impressions(s) / ED Diagnoses   Final diagnoses:  Recurrent left lower quadrant abdominal pain    New Prescriptions New Prescriptions   No medications on file     Laurence Spatesachel Morgan Little, MD 01/26/16 1221    Laurence Spatesachel Morgan Little, MD 01/26/16 1224      Laurence Spatesachel Morgan Little, MD 01/26/16 1234

## 2016-01-26 NOTE — ED Notes (Signed)
Pt cannot use restroom at this time, aware urine specimen is needed.  

## 2016-02-26 ENCOUNTER — Ambulatory Visit: Payer: Self-pay | Admitting: General Surgery

## 2016-03-02 ENCOUNTER — Encounter (HOSPITAL_COMMUNITY): Payer: Self-pay | Admitting: *Deleted

## 2016-03-03 ENCOUNTER — Ambulatory Visit (HOSPITAL_COMMUNITY)
Admission: RE | Admit: 2016-03-03 | Discharge: 2016-03-03 | Disposition: A | Discharge status: Home / Self Care | Payer: BC Managed Care – PPO | Source: Ambulatory Visit | Attending: General Surgery | Admitting: General Surgery

## 2016-03-03 ENCOUNTER — Ambulatory Visit (HOSPITAL_COMMUNITY): Payer: BC Managed Care – PPO | Admitting: Certified Registered Nurse Anesthetist

## 2016-03-03 ENCOUNTER — Encounter (HOSPITAL_COMMUNITY)
Admission: RE | Disposition: A | Discharge status: Home / Self Care | Payer: Self-pay | Source: Ambulatory Visit | Attending: General Surgery

## 2016-03-03 ENCOUNTER — Encounter (HOSPITAL_COMMUNITY): Payer: Self-pay | Admitting: Certified Registered Nurse Anesthetist

## 2016-03-03 DIAGNOSIS — Z841 Family history of disorders of kidney and ureter: Secondary | ICD-10-CM | POA: Insufficient documentation

## 2016-03-03 DIAGNOSIS — G5782 Other specified mononeuropathies of left lower limb: Secondary | ICD-10-CM | POA: Insufficient documentation

## 2016-03-03 DIAGNOSIS — Z8249 Family history of ischemic heart disease and other diseases of the circulatory system: Secondary | ICD-10-CM | POA: Insufficient documentation

## 2016-03-03 DIAGNOSIS — F17200 Nicotine dependence, unspecified, uncomplicated: Secondary | ICD-10-CM | POA: Diagnosis not present

## 2016-03-03 DIAGNOSIS — Z820 Family history of epilepsy and other diseases of the nervous system: Secondary | ICD-10-CM | POA: Diagnosis not present

## 2016-03-03 DIAGNOSIS — Z833 Family history of diabetes mellitus: Secondary | ICD-10-CM | POA: Insufficient documentation

## 2016-03-03 DIAGNOSIS — F419 Anxiety disorder, unspecified: Secondary | ICD-10-CM | POA: Diagnosis not present

## 2016-03-03 DIAGNOSIS — G8928 Other chronic postprocedural pain: Secondary | ICD-10-CM | POA: Diagnosis not present

## 2016-03-03 DIAGNOSIS — Z823 Family history of stroke: Secondary | ICD-10-CM | POA: Diagnosis not present

## 2016-03-03 DIAGNOSIS — Z811 Family history of alcohol abuse and dependence: Secondary | ICD-10-CM | POA: Diagnosis not present

## 2016-03-03 HISTORY — DX: Anxiety disorder, unspecified: F41.9

## 2016-03-03 HISTORY — PX: INGUINAL HERNIA REPAIR: SHX194

## 2016-03-03 HISTORY — DX: Diverticulitis of intestine, part unspecified, without perforation or abscess without bleeding: K57.92

## 2016-03-03 LAB — BASIC METABOLIC PANEL
Anion gap: 8 (ref 5–15)
BUN: 14 mg/dL (ref 6–20)
CALCIUM: 8.8 mg/dL — AB (ref 8.9–10.3)
CHLORIDE: 110 mmol/L (ref 101–111)
CO2: 21 mmol/L — ABNORMAL LOW (ref 22–32)
CREATININE: 0.82 mg/dL (ref 0.61–1.24)
Glucose, Bld: 100 mg/dL — ABNORMAL HIGH (ref 65–99)
Potassium: 3.9 mmol/L (ref 3.5–5.1)
SODIUM: 139 mmol/L (ref 135–145)

## 2016-03-03 LAB — CBC WITH DIFFERENTIAL/PLATELET
BASOS ABS: 0 10*3/uL (ref 0.0–0.1)
BASOS PCT: 1 %
EOS ABS: 0.5 10*3/uL (ref 0.0–0.7)
EOS PCT: 9 %
HCT: 38.7 % — ABNORMAL LOW (ref 39.0–52.0)
Hemoglobin: 13 g/dL (ref 13.0–17.0)
Lymphocytes Relative: 36 %
Lymphs Abs: 1.9 10*3/uL (ref 0.7–4.0)
MCH: 30.2 pg (ref 26.0–34.0)
MCHC: 33.6 g/dL (ref 30.0–36.0)
MCV: 89.8 fL (ref 78.0–100.0)
Monocytes Absolute: 0.3 10*3/uL (ref 0.1–1.0)
Monocytes Relative: 7 %
NEUTROS PCT: 47 %
Neutro Abs: 2.5 10*3/uL (ref 1.7–7.7)
PLATELETS: 142 10*3/uL — AB (ref 150–400)
RBC: 4.31 MIL/uL (ref 4.22–5.81)
RDW: 12.2 % (ref 11.5–15.5)
WBC: 5.3 10*3/uL (ref 4.0–10.5)

## 2016-03-03 SURGERY — REPAIR, HERNIA, INGUINAL, ADULT
Anesthesia: General | Site: Groin | Laterality: Left

## 2016-03-03 MED ORDER — FENTANYL CITRATE (PF) 100 MCG/2ML IJ SOLN
INTRAMUSCULAR | Status: AC
  Filled 2016-03-03: qty 2

## 2016-03-03 MED ORDER — LIDOCAINE 2% (20 MG/ML) 5 ML SYRINGE
INTRAMUSCULAR | Status: AC
  Filled 2016-03-03: qty 5

## 2016-03-03 MED ORDER — BUPIVACAINE HCL (PF) 0.25 % IJ SOLN
INTRAMUSCULAR | Status: AC
  Filled 2016-03-03: qty 30

## 2016-03-03 MED ORDER — HYDROMORPHONE HCL 1 MG/ML IJ SOLN
0.2500 mg | INTRAMUSCULAR | Status: DC | PRN
  Administered 2016-03-03 (×3): 0.5 mg via INTRAVENOUS

## 2016-03-03 MED ORDER — HYDROMORPHONE HCL 1 MG/ML IJ SOLN
INTRAMUSCULAR | Status: AC
  Administered 2016-03-03: 0.5 mg via INTRAVENOUS
  Filled 2016-03-03: qty 1

## 2016-03-03 MED ORDER — BUPIVACAINE HCL (PF) 0.25 % IJ SOLN
INTRAMUSCULAR | Status: DC | PRN
  Administered 2016-03-03: 20 mL

## 2016-03-03 MED ORDER — ONDANSETRON HCL 4 MG/2ML IJ SOLN
INTRAMUSCULAR | Status: DC | PRN
  Administered 2016-03-03: 4 mg via INTRAVENOUS

## 2016-03-03 MED ORDER — PROPOFOL 10 MG/ML IV BOLUS
INTRAVENOUS | Status: AC
  Filled 2016-03-03: qty 40

## 2016-03-03 MED ORDER — GABAPENTIN 300 MG PO CAPS
300.0000 mg | ORAL_CAPSULE | ORAL | Status: AC
  Administered 2016-03-03: 300 mg via ORAL
  Filled 2016-03-03: qty 1

## 2016-03-03 MED ORDER — ROCURONIUM BROMIDE 50 MG/5ML IV SOSY
PREFILLED_SYRINGE | INTRAVENOUS | Status: AC
  Filled 2016-03-03: qty 5

## 2016-03-03 MED ORDER — LIDOCAINE HCL (CARDIAC) 20 MG/ML IV SOLN
INTRAVENOUS | Status: DC | PRN
  Administered 2016-03-03: 60 mg via INTRAVENOUS

## 2016-03-03 MED ORDER — OXYCODONE HCL 5 MG PO TABS
ORAL_TABLET | ORAL | Status: AC
  Filled 2016-03-03: qty 1

## 2016-03-03 MED ORDER — 0.9 % SODIUM CHLORIDE (POUR BTL) OPTIME
TOPICAL | Status: DC | PRN
  Administered 2016-03-03: 1000 mL

## 2016-03-03 MED ORDER — ACETAMINOPHEN 500 MG PO TABS
1000.0000 mg | ORAL_TABLET | ORAL | Status: AC
  Administered 2016-03-03: 1000 mg via ORAL
  Filled 2016-03-03: qty 2

## 2016-03-03 MED ORDER — CHLORHEXIDINE GLUCONATE CLOTH 2 % EX PADS: 6.0000 | MEDICATED_PAD | Freq: Once | CUTANEOUS | Status: DC

## 2016-03-03 MED ORDER — OXYCODONE HCL 5 MG/5ML PO SOLN: 5.0000 mg | Freq: Once | ORAL | Status: AC | PRN

## 2016-03-03 MED ORDER — HYDROMORPHONE HCL 1 MG/ML IJ SOLN
INTRAMUSCULAR | Status: DC
Start: 2016-03-03 — End: 2016-03-03
  Filled 2016-03-03: qty 1

## 2016-03-03 MED ORDER — OXYCODONE-ACETAMINOPHEN 10-325 MG PO TABS: 1.0000 | ORAL_TABLET | ORAL | 0 refills | Status: DC | PRN

## 2016-03-03 MED ORDER — SUGAMMADEX SODIUM 200 MG/2ML IV SOLN
INTRAVENOUS | Status: DC | PRN
  Administered 2016-03-03: 200 mg via INTRAVENOUS

## 2016-03-03 MED ORDER — MIDAZOLAM HCL 5 MG/5ML IJ SOLN
INTRAMUSCULAR | Status: DC | PRN
  Administered 2016-03-03: 2 mg via INTRAVENOUS

## 2016-03-03 MED ORDER — SUGAMMADEX SODIUM 200 MG/2ML IV SOLN
INTRAVENOUS | Status: AC
  Filled 2016-03-03: qty 2

## 2016-03-03 MED ORDER — LACTATED RINGERS IV SOLN
INTRAVENOUS | Status: DC | PRN
  Administered 2016-03-03 (×2): via INTRAVENOUS

## 2016-03-03 MED ORDER — PROPOFOL 10 MG/ML IV BOLUS
INTRAVENOUS | Status: DC | PRN
  Administered 2016-03-03: 140 mg via INTRAVENOUS

## 2016-03-03 MED ORDER — FENTANYL CITRATE (PF) 100 MCG/2ML IJ SOLN
INTRAMUSCULAR | Status: DC | PRN
Start: 2016-03-03 — End: 2016-03-03
  Administered 2016-03-03 (×3): 50 ug via INTRAVENOUS
  Administered 2016-03-03: 100 ug via INTRAVENOUS
  Administered 2016-03-03 (×3): 50 ug via INTRAVENOUS

## 2016-03-03 MED ORDER — OXYCODONE HCL 5 MG PO TABS
5.0000 mg | ORAL_TABLET | Freq: Once | ORAL | Status: AC | PRN
  Administered 2016-03-03: 5 mg via ORAL

## 2016-03-03 MED ORDER — ONDANSETRON HCL 4 MG/2ML IJ SOLN
INTRAMUSCULAR | Status: AC
  Filled 2016-03-03: qty 2

## 2016-03-03 MED ORDER — MIDAZOLAM HCL 2 MG/2ML IJ SOLN
INTRAMUSCULAR | Status: AC
  Filled 2016-03-03: qty 2

## 2016-03-03 MED ORDER — FENTANYL CITRATE (PF) 100 MCG/2ML IJ SOLN
INTRAMUSCULAR | Status: AC
  Filled 2016-03-03: qty 4

## 2016-03-03 MED ORDER — ROCURONIUM BROMIDE 100 MG/10ML IV SOLN
INTRAVENOUS | Status: DC | PRN
  Administered 2016-03-03: 50 mg via INTRAVENOUS
  Administered 2016-03-03: 10 mg via INTRAVENOUS

## 2016-03-03 MED ORDER — CEFAZOLIN SODIUM-DEXTROSE 2-4 GM/100ML-% IV SOLN
2.0000 g | INTRAVENOUS | Status: AC
  Administered 2016-03-03: 2 g via INTRAVENOUS
  Filled 2016-03-03: qty 100

## 2016-03-03 SURGICAL SUPPLY — 58 items
APPLIER CLIP 9.375 SM OPEN (CLIP) ×3
BAG DECANTER FOR FLEXI CONT (MISCELLANEOUS) IMPLANT
BLADE SURG 10 STRL SS (BLADE) ×3 IMPLANT
BLADE SURG 15 STRL LF DISP TIS (BLADE) ×1 IMPLANT
BLADE SURG 15 STRL SS (BLADE) ×2
BLADE SURG ROTATE 9660 (MISCELLANEOUS) IMPLANT
CANISTER SUCTION 2500CC (MISCELLANEOUS) IMPLANT
CHLORAPREP W/TINT 26ML (MISCELLANEOUS) ×3 IMPLANT
CLEANER TIP ELECTROSURG 2X2 (MISCELLANEOUS) ×3 IMPLANT
CLIP APPLIE 9.375 SM OPEN (CLIP) ×1 IMPLANT
CLOSURE WOUND 1/2 X4 (GAUZE/BANDAGES/DRESSINGS) ×1
COVER SURGICAL LIGHT HANDLE (MISCELLANEOUS) ×3 IMPLANT
DECANTER SPIKE VIAL GLASS SM (MISCELLANEOUS) ×3 IMPLANT
DERMABOND ADVANCED (GAUZE/BANDAGES/DRESSINGS) ×2
DERMABOND ADVANCED .7 DNX12 (GAUZE/BANDAGES/DRESSINGS) ×1 IMPLANT
DRAIN PENROSE 1/2X12 LTX STRL (WOUND CARE) ×3 IMPLANT
DRAPE LAPAROTOMY TRNSV 102X78 (DRAPE) ×3 IMPLANT
DRAPE UTILITY XL STRL (DRAPES) ×3 IMPLANT
DRSG TEGADERM 4X4.75 (GAUZE/BANDAGES/DRESSINGS) ×3 IMPLANT
ELECT REM PT RETURN 9FT ADLT (ELECTROSURGICAL) ×3
ELECTRODE REM PT RTRN 9FT ADLT (ELECTROSURGICAL) ×1 IMPLANT
GAUZE SPONGE 4X4 16PLY XRAY LF (GAUZE/BANDAGES/DRESSINGS) IMPLANT
GLOVE BIO SURGEON STRL SZ8 (GLOVE) ×3 IMPLANT
GLOVE BIOGEL PI IND STRL 6.5 (GLOVE) ×1 IMPLANT
GLOVE BIOGEL PI IND STRL 8 (GLOVE) ×2 IMPLANT
GLOVE BIOGEL PI IND STRL 8.5 (GLOVE) ×1 IMPLANT
GLOVE BIOGEL PI INDICATOR 6.5 (GLOVE) ×2
GLOVE BIOGEL PI INDICATOR 8 (GLOVE) ×4
GLOVE BIOGEL PI INDICATOR 8.5 (GLOVE) ×2
GLOVE ECLIPSE 7.5 STRL STRAW (GLOVE) ×3 IMPLANT
GOWN STRL REUS W/ TWL LRG LVL3 (GOWN DISPOSABLE) ×2 IMPLANT
GOWN STRL REUS W/ TWL XL LVL3 (GOWN DISPOSABLE) ×1 IMPLANT
GOWN STRL REUS W/TWL LRG LVL3 (GOWN DISPOSABLE) ×4
GOWN STRL REUS W/TWL XL LVL3 (GOWN DISPOSABLE) ×2
KIT BASIN OR (CUSTOM PROCEDURE TRAY) ×3 IMPLANT
KIT ROOM TURNOVER OR (KITS) ×3 IMPLANT
NEEDLE HYPO 25GX1X1/2 BEV (NEEDLE) ×3 IMPLANT
NS IRRIG 1000ML POUR BTL (IV SOLUTION) ×3 IMPLANT
PACK SURGICAL SETUP 50X90 (CUSTOM PROCEDURE TRAY) ×3 IMPLANT
PAD ARMBOARD 7.5X6 YLW CONV (MISCELLANEOUS) ×3 IMPLANT
PENCIL BUTTON HOLSTER BLD 10FT (ELECTRODE) ×3 IMPLANT
SPECIMEN JAR SMALL (MISCELLANEOUS) ×21 IMPLANT
SPONGE INTESTINAL PEANUT (DISPOSABLE) ×3 IMPLANT
SPONGE LAP 18X18 X RAY DECT (DISPOSABLE) IMPLANT
STRIP CLOSURE SKIN 1/2X4 (GAUZE/BANDAGES/DRESSINGS) ×2 IMPLANT
SUT ETHIBOND 0 MO6 C/R (SUTURE) ×3 IMPLANT
SUT MON AB 4-0 PC3 18 (SUTURE) ×3 IMPLANT
SUT PROLENE 0 CT 2 (SUTURE) IMPLANT
SUT VIC AB 3-0 SH 27 (SUTURE) ×4
SUT VIC AB 3-0 SH 27X BRD (SUTURE) ×2 IMPLANT
SUT VICRYL AB 3 0 TIES (SUTURE) ×3 IMPLANT
SYR BULB 3OZ (MISCELLANEOUS) ×3 IMPLANT
SYR CONTROL 10ML LL (SYRINGE) ×3 IMPLANT
TOWEL OR 17X24 6PK STRL BLUE (TOWEL DISPOSABLE) ×3 IMPLANT
TOWEL OR 17X26 10 PK STRL BLUE (TOWEL DISPOSABLE) IMPLANT
TUBE CONNECTING 12'X1/4 (SUCTIONS) ×1
TUBE CONNECTING 12X1/4 (SUCTIONS) ×2 IMPLANT
YANKAUER SUCT BULB TIP NO VENT (SUCTIONS) ×3 IMPLANT

## 2016-03-03 NOTE — H&P (Signed)
Fowler L. Hoak 02/17/2016 9:22 AM Location: [facility] Patient #: [id number] DOB: 12/01/1980 Married / Language: English / Race: Black or African American Male   History of Present Illness Isaiah Heath(Marquize Seib O. Lindie SpruceWyatt MD; 02/17/2016 9:43 AM) Patient words: New-IH.  The patient is a 35 year old male who presents with inguinal pain. No changes in management were made at the last visit. Symptoms include inguinal pain and inguinal swelling. Symptoms are located in the left groin region, left hemiscrotum and left inguinal region. The pain radiates to the left hemiscrotum, left lower abdomen and left inguinal region. The patient describes the pain as sharp (shooting, associated with left vein thrombosis). Onset was gradual 3 year(s) ago. Current treatment includes heat and nonsteroidal anti-inflammatory drugs.   Other Problems Doristine Devoid(Chemira Jones, CMA; 02/17/2016 9:22 AM) Inguinal Hernia   Past Surgical History Doristine Devoid(Chemira Jones, CMA; 02/17/2016 9:22 AM) Open Inguinal Hernia Surgery  Left.  Diagnostic Studies History Doristine Devoid(Chemira Jones, CMA; 02/17/2016 9:22 AM) Colonoscopy  never  Allergies Doristine Devoid(Chemira Jones, CMA; 02/17/2016 9:23 AM) No Known Drug Allergies 02/17/2016  Medication History Doristine Devoid(Chemira Jones, CMA; 02/17/2016 9:23 AM) No Current Medications Medications Reconciled  Social History Doristine Devoid(Chemira Jones, CMA; 02/17/2016 9:22 AM) Alcohol use  Occasional alcohol use. Caffeine use  Coffee. Tobacco use  Current some day smoker.  Family History Doristine Devoid(Chemira Jones, CMA; 02/17/2016 9:22 AM) Alcohol Abuse  Father. Cerebrovascular Accident  Father. Diabetes Mellitus  Mother. Hypertension  Father, Mother. Kidney Disease  Father. Migraine Headache  Mother.    Review of Systems Doristine Devoid(Chemira Jones CMA; 02/17/2016 9:22 AM) General Present- Appetite Loss and Weight Loss. Not Present- Chills, Fatigue, Fever, Night Sweats and Weight Gain. HEENT Present- Ringing in the Ears. Not Present- Earache,  Hearing Loss, Hoarseness, Nose Bleed, Oral Ulcers, Seasonal Allergies, Sinus Pain, Sore Throat, Visual Disturbances, Wears glasses/contact lenses and Yellow Eyes. Gastrointestinal Present- Abdominal Pain, Bloating, Bloody Stool, Change in Bowel Habits, Constipation, Gets full quickly at meals and Rectal Pain. Not Present- Chronic diarrhea, Difficulty Swallowing, Excessive gas, Hemorrhoids, Indigestion, Nausea and Vomiting. Male Genitourinary Present- Nocturia. Not Present- Blood in Urine, Change in Urinary Stream, Frequency, Impotence, Painful Urination, Urgency and Urine Leakage. Psychiatric Present- Anxiety. Not Present- Bipolar, Change in Sleep Pattern, Depression, Fearful and Frequent crying.  Vitals (Chemira Jones CMA; 02/17/2016 9:22 AM) 02/17/2016 9:22 AM Weight: 149.4 lb Height: 74in Body Surface Area: 1.92 m Body Mass Index: 19.18 kg/m  Temp.: 98.66F(Oral)  Pulse: 71 (Regular)  BP: 108/72 (Sitting, Left Arm, Standard) BP today 130/79, P 61  Physical Exam (Marily Konczal O. Ranita Stjulien MD; 02/17/2016 9:48 AM) Abdomen Inspection Hernias - Inguinal hernia - Left - Note: No real hernia palpated, but some bulging on the left with coughing and straining. One can palpate of thrombosed vein coming down the medial aspect of the left thigh. Patient has a hard time doing his normal work and exercising. Able to localize the pain at the tubercle.  Patient marked on the left side.    Assessment & Plan Fayrene Fearing(Jamieon Lannen O. Jelene Albano MD; 02/17/2016 9:54 AM) INGUINAL PAIN, LEFT (R10.32) Story: [place] and had problems since that time. Seenin the ED multiple times. Almost neuropathic type pain, but patient does have some physical changes in that area with venous thrombosis Impression: Chronic inguinal pain with acute exacerbation. Possible neuralgia. Possible recurrence, not easily identified. Re-exploration for nerve entrapment seems appropriate. Will try some neurontin which if  it works would lead me to explore the area not just to remove  the mesh, but also to look for entrapped nerve and release or resect the nerve.  Will not schedule surgery until records from previous procedure are obtained. 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 X1.  The medication did not work well at all.  Will explore for entrapment and possible mesh removal.  Isaiah LamasJames O. Gae BonWyatt, III, MD, FACS [phone]--pager [phone]--office [facility]

## 2016-03-03 NOTE — Transfer of Care (Signed)
Immediate Anesthesia Transfer of Care Note  Patient: Isaiah Heath  Procedure(s) Performed: Procedure(s): LEFT INGUNIAL HERNIA EXPLORATION (Left)  Patient Location: PACU  Anesthesia Type:General  Level of Consciousness: awake, alert  and oriented  Airway & Oxygen Therapy: Patient Spontanous Breathing and Patient connected to nasal cannula oxygen  Post-op Assessment: Report given to RN and Post -op Vital signs reviewed and stable  Post vital signs: Reviewed and stable  Last Vitals:  Vitals:   03/03/16 0702 03/03/16 1022  BP: 130/79 (!) 137/96  Pulse: 61 78  Resp: 18 12  Temp: 36.7 C 36.5 C    Last Pain:  Vitals:   03/03/16 0702  TempSrc: Oral         Complications: No apparent anesthesia complications

## 2016-03-03 NOTE — Discharge Instructions (Addendum)
Open Hernia Repair, Adult, Care After This sheet gives you information about how to care for yourself after your procedure. Your health care provider may also give you more specific instructions. If you have problems or questions, contact your health care provider. What can I expect after the procedure? After the procedure, it is common to have:  Mild discomfort.  Slight bruising.  Minor swelling.  Pain in the abdomen. Follow these instructions at home: Incision care  Follow instructions from your health care provider about how to take care of your incision area. Make sure you:  Wash your hands with soap and water before you change your bandage (dressing). If soap and water are not available, use hand sanitizer.  Change your dressing as told by your health care provider.  Leave stitches (sutures), skin glue, or adhesive strips in place. These skin closures may need to stay in place for 2 weeks or longer. If adhesive strip edges start to loosen and curl up, you may trim the loose edges. Do not remove adhesive strips completely unless your health care provider tells you to do that.  Check your incision area every day for signs of infection. Check for:  More redness, swelling, or pain.  More fluid or blood.  Warmth.  Pus or a bad smell. Activity  Do not drive or use heavy machinery while taking prescription pain medicine. Do not drive until your health care provider approves.  Until your health care provider approves:  Do not lift anything that is heavier than 10 lb (4.5 kg).  Do not play contact sports.  Return to your normal activities as told by your health care provider. Ask your health care provider what activities are safe. General instructions  To prevent or treat constipation while you are taking prescription pain medicine, your health care provider may recommend that you:  Drink enough fluid to keep your urine clear or pale yellow.  Take over-the-counter or  prescription medicines.  Eat foods that are high in fiber, such as fresh fruits and vegetables, whole grains, and beans.  Limit foods that are high in fat and processed sugars, such as fried and sweet foods.  Take over-the-counter and prescription medicines only as told by your health care provider.  Do not take tub baths or go swimming until your health care provider approves.  Keep all follow-up visits as told by your health care provider. This is important.  Leave the dressing intact  Call if there is bleeding that lifts off the dressing. Contact a health care provider if:  You develop a rash.  You have more redness, swelling, or pain around your incision.  You have more fluid or blood coming from your incision.  Your incision feels warm to the touch.  You have pus or a bad smell coming from your incision.  You have a fever or chills.  You have blood in your stool (feces).  You have not had a bowel movement in 2-3 days.  Your pain is not controlled with medicine. Get help right away if:  You have chest pain or shortness of breath.  You feel light-headed or feel faint.  You have severe pain.  You vomit and your pain is worse. This information is not intended to replace advice given to you by your health care provider. Make sure you discuss any questions you have with your health care provider. Document Released: 09/25/2004 Document Revised: 09/26/2015 Document Reviewed: 08/20/2015 Elsevier Interactive Patient Education  2017 [employer]

## 2016-03-03 NOTE — Anesthesia Postprocedure Evaluation (Signed)
Anesthesia Post Note  Patient: Isaiah Heath  Procedure(s) Performed: Procedure(s) (LRB): LEFT INGUNIAL HERNIA EXPLORATION (Left)  Patient location during evaluation: PACU Anesthesia Type: General Level of consciousness: awake and alert Pain management: pain level controlled Vital Signs Assessment: post-procedure vital signs reviewed and stable Respiratory status: spontaneous breathing, respiratory function stable and patient connected to nasal cannula oxygen Cardiovascular status: stable Postop Assessment: no signs of nausea or vomiting Anesthetic complications: no    Last Vitals:  Vitals:   03/03/16 1215 03/03/16 1216  BP: (!) 142/96 (!) 142/96  Pulse: 67   Resp: 16   Temp: 36.9 C     Last Pain:  Vitals:   03/03/16 1216  TempSrc:   PainSc: Asleep                 Bushra Denman

## 2016-03-03 NOTE — Op Note (Signed)
OPERATIVE REPORT  DATE OF OPERATION: 03/03/2016  PATIENT:  Isaiah Heath  35 y.o. male  PRE-OPERATIVE DIAGNOSIS:  Chronic inguinal pain after previous inguinal hernia repair  POST-OPERATIVE DIAGNOSIS:  Chronic inguinal pain after previous inguinal hernia repair  INDICATION(S) FOR OPERATION:  Chronic pain after previous left inguinal hernia repair with mesh  FINDINGS:  Entrapped iliohypogastric nerve at the internal ring near area of mesh attachment  PROCEDURE:  Procedure(s): LEFT INGUNIAL HERNIA EXPLORATION  SURGEON:  Surgeon(s): Jimmye NormanJames Renata Gambino, MD  ASSISTANT: None  ANESTHESIA:   local and general  COMPLICATIONS:  None  EBL: 20 ml  BLOOD ADMINISTERED: none  DRAINS: none   SPECIMEN:  Source of Specimen:  Multiple soft tissue specimens of peripheral nerves, the most important named "iliohypogastric nerve"  COUNTS CORRECT:  YES  PROCEDURE DETAILS: The patient was taken to the operating room and placed on table in supine position. After an adequate general endotracheal anesthetic was administered, he was prepped and draped in the usual sterile manner exposing his left inguinal area.  A proper timeout was performed identifying the patient and procedure to be performed. At the site of his previous inguinal incision a #15 blade was used to make an incision through the previous one down into subcutaneous tissue. We then methodically dissected down through the subcutaneous tissue into the subcutaneous tissue were in a large linear firm pieces of tissue were taken for possible peripheral nerve specimen. There was one in the subcutaneous tissue that was sent off as a specimen.  We dissected down to the external oblique fascia where we could see the spermatic cord coming out from the superficial ring. We were able to mobilize the spermatic cord using a Penrose drain prior to opening up the fascia. We dissected out multiple what appeared to be peripheral nerves exiting the superficial  ring, some of which appeared to be caught up in scar tissue. These were resected and sent as specimens. We ultimately opened up the fascia on top of the spermatic cord freeing up the cord and dissecting out multiple pieces of scar tissue. There was one nerve that was on the inferiolateral aspect of the spermatic cord that coursed right up to the side of the mesh insertion at the deep inguinal ring. There is appeared to be abruptly immobilized at the mesh and would not move until we were able to free it up from the surrounding scar tissue and the mesh using blunt and sharp dissection. Proximal to the point where it appeared to be fixed, we were able to remove the nerve and a subsequent release in that specimen as the resected iliohypogastric nerve.  There was minimal bleeding but a small clip was placed on one small blood vessels that course along with the spermatic cord. We irrigated with saline solution. We then closed the external oblique fascia on top the cord using running 3-0 Vicryl suture. We injected 0.25% Marcaine with epinephrine into the wound. We then closed the skin using running subcuticular stitch of 4-0 Monocryl. Carpus fascia was also reapproximated with 3-0 Vicryl. Dermabond Steri-Strips and Tegaderms are used to complete the dressing.  All needle counts, sponge counts, and instrument counts were correct.  PATIENT DISPOSITION:  PACU - hemodynamically stable.   [name] 12/13/201710:11 AM

## 2016-03-03 NOTE — OR Nursing (Signed)
Pt assisted to the BR.  He voided.  Pt assisted to the recliner.  He stated he was still in a lot of pain.  I asked him if he wanted me to call the doctor.  He stated he wanted to go home and get in his own bed.  Incision intact and approximated.  HR 60s, RR 14, BP 140/90s, Sats 95% on RA.  Pupils pinpoint. Pt was asleep and comfortable. I had to wake him to assess his pain level.

## 2016-03-03 NOTE — Anesthesia Preprocedure Evaluation (Addendum)
Anesthesia Evaluation  Patient identified by MRN, date of birth, ID band Patient awake    Reviewed: Allergy & Precautions, NPO status , Patient's Chart, lab work & pertinent test results  History of Anesthesia Complications Negative for: history of anesthetic complications  Airway Mallampati: II  TM Distance: >3 FB Neck ROM: Full    Dental  (+) Teeth Intact   Pulmonary Current Smoker,    breath sounds clear to auscultation       Cardiovascular negative cardio ROS   Rhythm:Regular     Neuro/Psych Anxiety    GI/Hepatic negative GI ROS, Neg liver ROS,   Endo/Other  negative endocrine ROS  Renal/GU negative Renal ROS     Musculoskeletal   Abdominal   Peds  Hematology negative hematology ROS (+)   Anesthesia Other Findings   Reproductive/Obstetrics                             Anesthesia Physical Anesthesia Plan  ASA: II  Anesthesia Plan: General   Post-op Pain Management:    Induction: Intravenous  Airway Management Planned: Oral ETT  Additional Equipment: None  Intra-op Plan:   Post-operative Plan: Extubation in OR  Informed Consent: I have reviewed the patients History and Physical, chart, labs and discussed the procedure including the risks, benefits and alternatives for the proposed anesthesia with the patient or authorized representative who has indicated his/her understanding and acceptance.   Dental advisory given  Plan Discussed with: CRNA and Surgeon  Anesthesia Plan Comments:         Anesthesia Quick Evaluation

## 2016-03-03 NOTE — Anesthesia Procedure Notes (Signed)
Procedure Name: Intubation Date/Time: 03/03/2016 8:36 AM Performed by: Jed LimerickHARDER, Adib Wahba S Pre-anesthesia Checklist: Patient identified, Emergency Drugs available, Suction available and Patient being monitored Patient Re-evaluated:Patient Re-evaluated prior to inductionOxygen Delivery Method: Circle System Utilized Preoxygenation: Pre-oxygenation with 100% oxygen Intubation Type: IV induction Ventilation: Mask ventilation without difficulty Laryngoscope Size: Mac and 4 Grade View: Grade I Tube type: Oral Tube size: 7.5 mm Number of attempts: 1 Airway Equipment and Method: Stylet and Oral airway Placement Confirmation: ETT inserted through vocal cords under direct vision,  positive ETCO2 and breath sounds checked- equal and bilateral Secured at: 22 cm Tube secured with: Tape Dental Injury: Teeth and Oropharynx as per pre-operative assessment

## 2016-03-04 ENCOUNTER — Encounter (HOSPITAL_COMMUNITY): Payer: Self-pay | Admitting: General Surgery

## 2016-03-04 NOTE — Progress Notes (Signed)
0.5 Dilaudid wasted 03/04/16 LATE ENTRY witnessed by The Progressive CorporationMark RN.

## 2016-08-10 ENCOUNTER — Emergency Department (HOSPITAL_COMMUNITY)
Admission: EM | Admit: 2016-08-10 | Discharge: 2016-08-10 | Disposition: A | Discharge status: Home / Self Care | Payer: BC Managed Care – PPO | Attending: Emergency Medicine | Admitting: Emergency Medicine

## 2016-08-10 ENCOUNTER — Encounter (HOSPITAL_COMMUNITY): Payer: Self-pay | Admitting: Nurse Practitioner

## 2016-08-10 ENCOUNTER — Emergency Department (HOSPITAL_COMMUNITY): Payer: BC Managed Care – PPO

## 2016-08-10 DIAGNOSIS — R1013 Epigastric pain: Secondary | ICD-10-CM

## 2016-08-10 DIAGNOSIS — F17200 Nicotine dependence, unspecified, uncomplicated: Secondary | ICD-10-CM | POA: Diagnosis not present

## 2016-08-10 DIAGNOSIS — Z7982 Long term (current) use of aspirin: Secondary | ICD-10-CM | POA: Insufficient documentation

## 2016-08-10 DIAGNOSIS — K2900 Acute gastritis without bleeding: Secondary | ICD-10-CM | POA: Diagnosis not present

## 2016-08-10 LAB — CBC WITH DIFFERENTIAL/PLATELET
BASOS ABS: 0 10*3/uL (ref 0.0–0.1)
Basophils Relative: 0 %
EOS ABS: 0.3 10*3/uL (ref 0.0–0.7)
EOS PCT: 3 %
HCT: 41.1 % (ref 39.0–52.0)
HEMOGLOBIN: 13.3 g/dL (ref 13.0–17.0)
LYMPHS PCT: 13 %
Lymphs Abs: 1.3 10*3/uL (ref 0.7–4.0)
MCH: 29.3 pg (ref 26.0–34.0)
MCHC: 32.4 g/dL (ref 30.0–36.0)
MCV: 90.5 fL (ref 78.0–100.0)
Monocytes Absolute: 0.4 10*3/uL (ref 0.1–1.0)
Monocytes Relative: 4 %
NEUTROS PCT: 80 %
Neutro Abs: 7.9 10*3/uL — ABNORMAL HIGH (ref 1.7–7.7)
PLATELETS: 185 10*3/uL (ref 150–400)
RBC: 4.54 MIL/uL (ref 4.22–5.81)
RDW: 12.6 % (ref 11.5–15.5)
WBC: 9.8 10*3/uL (ref 4.0–10.5)

## 2016-08-10 LAB — COMPREHENSIVE METABOLIC PANEL
ALT: 16 U/L — ABNORMAL LOW (ref 17–63)
ANION GAP: 8 (ref 5–15)
AST: 26 U/L (ref 15–41)
Albumin: 4.2 g/dL (ref 3.5–5.0)
Alkaline Phosphatase: 55 U/L (ref 38–126)
BUN: 10 mg/dL (ref 6–20)
CALCIUM: 9.3 mg/dL (ref 8.9–10.3)
CHLORIDE: 106 mmol/L (ref 101–111)
CO2: 24 mmol/L (ref 22–32)
CREATININE: 0.92 mg/dL (ref 0.61–1.24)
GFR calc Af Amer: >60 mL/min (ref >60)
Glucose, Bld: 112 mg/dL — ABNORMAL HIGH (ref 65–99)
Potassium: 4.2 mmol/L (ref 3.5–5.1)
SODIUM: 138 mmol/L (ref 135–145)
Total Bilirubin: 0.8 mg/dL (ref 0.3–1.2)
Total Protein: 7 g/dL (ref 6.5–8.1)

## 2016-08-10 LAB — URINALYSIS, ROUTINE W REFLEX MICROSCOPIC
Bilirubin Urine: NEGATIVE
Glucose, UA: NEGATIVE mg/dL
Hgb urine dipstick: NEGATIVE
Ketones, ur: 5 mg/dL — AB
LEUKOCYTES UA: NEGATIVE
Nitrite: NEGATIVE
PROTEIN: NEGATIVE mg/dL
SPECIFIC GRAVITY, URINE: 1.024 (ref 1.005–1.030)
pH: 5 (ref 5.0–8.0)

## 2016-08-10 LAB — LIPASE, BLOOD: LIPASE: 16 U/L (ref 11–51)

## 2016-08-10 MED ORDER — HYDROMORPHONE HCL 1 MG/ML IJ SOLN
0.5000 mg | INTRAMUSCULAR | Status: AC | PRN
  Administered 2016-08-10 (×3): 0.5 mg via INTRAVENOUS

## 2016-08-10 MED ORDER — SODIUM CHLORIDE 0.9 % IV BOLUS (SEPSIS)
1000.0000 mL | Freq: Once | INTRAVENOUS | Status: AC
  Administered 2016-08-10: 1000 mL via INTRAVENOUS

## 2016-08-10 MED ORDER — HYDROMORPHONE HCL 1 MG/ML IJ SOLN
INTRAMUSCULAR | Status: AC
  Filled 2016-08-10: qty 1

## 2016-08-10 MED ORDER — ONDANSETRON HCL 4 MG/2ML IJ SOLN
4.0000 mg | Freq: Once | INTRAMUSCULAR | Status: AC
  Administered 2016-08-10: 4 mg via INTRAVENOUS

## 2016-08-10 MED ORDER — TRAMADOL HCL 50 MG PO TABS: 50.0000 mg | ORAL_TABLET | Freq: Four times a day (QID) | ORAL | 0 refills | Status: DC | PRN

## 2016-08-10 MED ORDER — LANSOPRAZOLE 30 MG PO TBDP: 30.0000 mg | ORAL_TABLET | Freq: Every day | ORAL | 1 refills | Status: DC

## 2016-08-10 MED ORDER — SODIUM CHLORIDE 0.9 % IV SOLN
INTRAVENOUS | Status: DC
  Administered 2016-08-10: 12:00:00 via INTRAVENOUS

## 2016-08-10 MED ORDER — PANTOPRAZOLE SODIUM 40 MG PO TBEC
40.0000 mg | DELAYED_RELEASE_TABLET | Freq: Once | ORAL | Status: AC
  Administered 2016-08-10: 40 mg via ORAL
  Filled 2016-08-10: qty 1

## 2016-08-10 MED ORDER — GI COCKTAIL ~~LOC~~
30.0000 mL | Freq: Once | ORAL | Status: AC
  Administered 2016-08-10: 30 mL via ORAL
  Filled 2016-08-10: qty 30

## 2016-08-10 MED ORDER — IOPAMIDOL (ISOVUE-300) INJECTION 61%
INTRAVENOUS | Status: AC
  Administered 2016-08-10: 100 mL
  Filled 2016-08-10: qty 100

## 2016-08-10 NOTE — ED Notes (Signed)
Pt to scans 

## 2016-08-10 NOTE — Discharge Instructions (Signed)
Avoid spicy or acidic foods, take the antacids as prescribed, you can also take over the counter maalox to help with the burning, follow up with a GI doctor for further evaluation, return as needed for worsening symptoms

## 2016-08-10 NOTE — ED Provider Notes (Signed)
[facility]-EMERGENCY DEPT Provider Note   CSN: [id number] Arrival date & time: 08/10/16  4098     History   Chief Complaint Chief Complaint  Patient presents with  . Abdominal Pain    HPI Isaiah Heath is a 36 y.o. male.  HPI Patient presents to the emergency room for evaluation of acute onset of abdominal pain that started this morning at about 5:30. Patient woke up from sleep with a sensation of nausea and abdominal pain. He vomited several times. Denies any trouble with any diarrhea or constipation. The pain persisted and became more severe. It is felt feverish but has not measured a temperature. He denies any dysuria. Patient does have history of diverticulitis and a hernia. Past Medical History:  Diagnosis Date  . Anxiety   . Constipation   . Diverticulitis   . Ear infection   . Inguinal hernia, left     There are no active problems to display for this patient.   Past Surgical History:  Procedure Laterality Date  . HERNIA REPAIR Left [facility] surgery, Dr. Nelda Marseille  . INGUINAL HERNIA REPAIR Left 03/03/2016   Procedure: LEFT INGUNIAL HERNIA EXPLORATION;  Surgeon: Jimmye Norman, MD;  Location: [facility] OR;  Service: General;  Laterality: Left;  . PROSTATE BIOPSY         Home Medications    Prior to Admission medications   Medication Sig Start Date End Date Taking? Authorizing Provider  Aspirin-Acetaminophen-Caffeine (GOODY HEADACHE PO) Take 1 packet by mouth daily as needed (headache).   Yes [provider]  lansoprazole (PREVACID SOLUTAB) 30 MG disintegrating tablet Take 1 tablet (30 mg total) by mouth daily. 08/10/16   Linwood Dibbles, MD  traMADol (ULTRAM) 50 MG tablet Take 1 tablet (50 mg total) by mouth every 6 (six) hours as needed. 08/10/16   Linwood Dibbles, MD    Family History No family history on file.  Social History Social History  Substance Use Topics  . Smoking status: Current Some Day Smoker    Packs/day: 0.25    Years: 10.00    . Smokeless tobacco: Never Used  . Alcohol use Yes     Comment: rare     Allergies   No known allergies   Review of Systems Review of Systems  All other systems reviewed and are negative.    Physical Exam Updated Vital Signs BP 123/77   Pulse (!) 58   Temp 97.5 F (36.4 C) (Oral)   Resp 16   Ht 1.88 m (6\' 2" )   Wt 68.9 kg (152 lb)   SpO2 99%   BMI 19.52 kg/m   Physical Exam  Constitutional: He appears well-developed and well-nourished.  HENT:  Head: Normocephalic and atraumatic.  Right Ear: External ear normal.  Left Ear: External ear normal.  Eyes: Conjunctivae are normal. Right eye exhibits no discharge. Left eye exhibits no discharge. No scleral icterus.  Neck: Neck supple. No tracheal deviation present.  Cardiovascular: Normal rate, regular rhythm and intact distal pulses.   Pulmonary/Chest: Effort normal and breath sounds normal. No stridor. No respiratory distress. He has no wheezes. He has no rales.  Abdominal: Soft. Bowel sounds are normal. He exhibits no distension. There is tenderness in the epigastric area. There is guarding. There is no rebound.  Musculoskeletal: He exhibits no edema or tenderness.  Neurological: He is alert. He has normal strength. No cranial nerve deficit (no facial droop, extraocular movements intact, no slurred speech) or sensory deficit. He exhibits normal  muscle tone. He displays no seizure activity. Coordination normal.  Skin: Skin is warm and dry. No rash noted. He is not diaphoretic.  Psychiatric: He has a normal mood and affect.  Nursing note and vitals reviewed.    ED Treatments / Results  Labs (all labs ordered are listed, but only abnormal results are displayed) Labs Reviewed  COMPREHENSIVE METABOLIC PANEL - Abnormal; Notable for the following:       Result Value   Glucose, Bld 112 (*)    ALT 16 (*)    All other components within normal limits  CBC WITH DIFFERENTIAL/PLATELET - Abnormal; Notable for the following:     Neutro Abs 7.9 (*)    All other components within normal limits  URINALYSIS, ROUTINE W REFLEX MICROSCOPIC - Abnormal; Notable for the following:    Ketones, ur 5 (*)    All other components within normal limits  LIPASE, BLOOD     Radiology Ct Abdomen Pelvis W Contrast  Result Date: 08/10/2016 CLINICAL DATA:  Seven abdominal pain. Constipation. Abdominal surgery December 2017. EXAM: CT ABDOMEN AND PELVIS WITH CONTRAST TECHNIQUE: Multidetector CT imaging of the abdomen and pelvis was performed using the standard protocol following bolus administration of intravenous contrast. CONTRAST:  100mL ISOVUE-300 IOPAMIDOL (ISOVUE-300) INJECTION 61% COMPARISON:  10/06/2015 FINDINGS: Lower chest:  Negative. Hepatobiliary: No focal liver abnormality.No evidence of biliary obstruction or stone. Pancreas: Unremarkable. Spleen: Unremarkable. Adrenals/Urinary Tract: Negative adrenals. No hydronephrosis or stone. Motion artifact at the level of the upper pole right kidney. Unremarkable bladder. Stomach/Bowel: No obstruction or inflammation noted. The appendix is identified with moderate certainty in the right hemipelvis. No focal right lower inflammation. Few colonic diverticula. No appendicitis. Vascular/Lymphatic: No acute vascular abnormality. No mass or adenopathy. Reproductive:No pathologic findings. Other: Small volume fluid in the pneumoperitoneum. Surgical clip in the left groin in this patient with history of hernia repair. Musculoskeletal: Focal disc degeneration at L5-S1 with narrowing, spurring, and endplate irregularity, stable. IMPRESSION: 1. Small volume pelvic fluid that is unexpected, but no underlying intra-abdominal inflammation identified. 2. History of constipation but no abnormal stool retention. 3. Mild colonic diverticulosis. Electronically Signed   By: Marnee SpringJonathon  Watts M.D.   On: 08/10/2016 14:37   Dg Abd Acute W/chest  Result Date: 08/10/2016 CLINICAL DATA:  Burning abdominal pain. Emesis.  History of prior hernia repair. History of diverticulitis . EXAM: DG ABDOMEN ACUTE W/ 1V CHEST COMPARISON:  CT 10/06/2015 . FINDINGS: No acute cardiopulmonary disease. Apical pleural-parenchymal thickening consistent scarring. Soft tissue structures of the abdomen are unremarkable. Gastric distention. Scattered air-fluid levels in the right upper quadrant, most likely in the colon. Diarrheal illness or focal mild ileus cannot be excluded . No bowel distention. Pelvic calcifications consistent phleboliths. No acute bony abnormality. IMPRESSION: Stomach is distended. Scattered air-fluid levels noted in the right upper quadrant most likely colon. A diarrheal illness or focal mild ileus cannot be excluded. No bowel distention . Electronically Signed   By: Maisie Fushomas  Register   On: 08/10/2016 10:51    Procedures Procedures (including critical care time)  Medications Ordered in ED Medications  sodium chloride 0.9 % bolus 1,000 mL (0 mLs Intravenous Stopped 08/10/16 1130)    And  0.9 %  sodium chloride infusion ( Intravenous New Bag/Given 08/10/16 1132)  HYDROmorphone (DILAUDID) 1 MG/ML injection (not administered)  HYDROmorphone (DILAUDID) 1 MG/ML injection (not administered)  gi cocktail (Maalox,Lidocaine,Donnatal) (not administered)  pantoprazole (PROTONIX) EC tablet 40 mg (not administered)  HYDROmorphone (DILAUDID) injection 0.5 mg (0.5 mg Intravenous Given  08/10/16 1349)  ondansetron (ZOFRAN) injection 4 mg (4 mg Intravenous Given 08/10/16 1030)  iopamidol (ISOVUE-300) 61 % injection (100 mLs  Contrast Given 08/10/16 1406)     Initial Impression / Assessment and Plan / ED Course  I have reviewed the triage vital signs and the nursing notes.  Pertinent labs & imaging results that were available during my care of the patient were reviewed by me and considered in my medical decision making (see chart for details).  Clinical Course as of Aug 11 1511  Tue Aug 10, 2016  1217 Patient is still having  significant pain. Labs are unrevealing.  We'll proceed with abdominal pelvic CT scan  [JK]    Clinical Course User Index [JK] Linwood Dibbles, MD   Patient presented to the emergency room with upper abdominal pain. He continued to have persistent burning discomfort in his upper abdomen although no further episodes of vomiting. His laboratory tests are unremarkable. CT scan does not show any evidence of acute obstruction, infection, inflammation, or perforation. I suspect his symptoms could be related to gastritis possibly peptic ulcer disease. We'll plan on discharge home with antacids. Discussed outpatient follow-up with a GI doctor.  Final Clinical Impressions(s) / ED Diagnoses   Final diagnoses:  Epigastric pain  Acute gastritis without hemorrhage, unspecified gastritis type    New Prescriptions New Prescriptions   LANSOPRAZOLE (PREVACID SOLUTAB) 30 MG DISINTEGRATING TABLET    Take 1 tablet (30 mg total) by mouth daily.   TRAMADOL (ULTRAM) 50 MG TABLET    Take 1 tablet (50 mg total) by mouth every 6 (six) hours as needed.     Linwood Dibbles, MD 08/10/16 [id number]

## 2016-08-10 NOTE — ED Triage Notes (Signed)
Per [facility] pt woke up this morning with upper abdominal pain. Patient had hernia surgery in December. Patient was at work [employer] and was laying on bench due to severe abdominal pain. Patient endorses subjective fevers started today- patient diaphoretic, guarding abdomen. Patient has vomited about 4 times- clear green fluid. Patient rates pain 9/10

## 2016-08-17 ENCOUNTER — Ambulatory Visit: Payer: BC Managed Care – PPO | Admitting: Family Medicine

## 2016-09-20 IMAGING — CT CT ABD-PELV W/ CM
2 of 4 series · 15 of 46 positions shown, 17 images · IV contrast (Omni 300)
Comparison: CT abdomen and pelvis 02/12/2013.

CLINICAL DATA: Left flank, left lower quadrant, left scrotal and
testicular pain.

EXAM:
CT ABDOMEN AND PELVIS WITH CONTRAST
TECHNIQUE: Multidetector CT imaging of the abdomen and pelvis was performed
using the standard protocol following bolus administration of
intravenous contrast.
CONTRAST:  100mL OMNIPAQUE IOHEXOL 300 MG/ML  SOLN

[Series 2: abd/ pelvis 5.0 i30f 1 · axial · 0.70mm/px · z∈[-673,-238]mm · 12 of 99 slices shown, 14 images]
[im 8/99  soft-tissue]
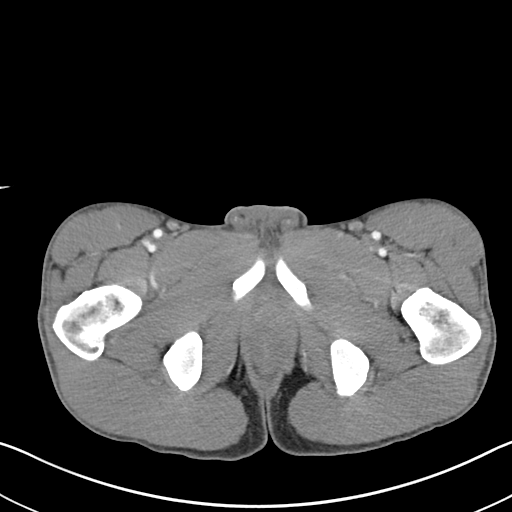
[im 8/99  bone]
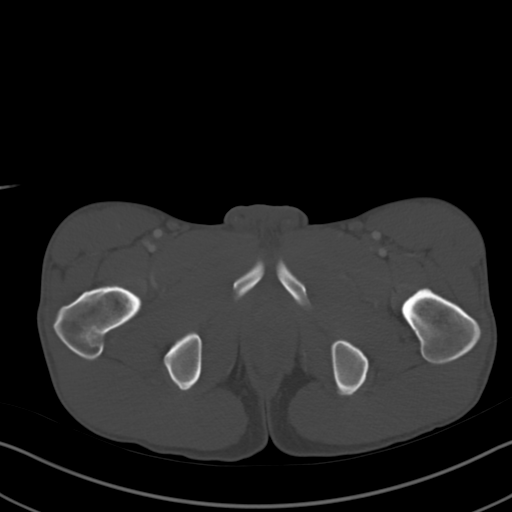
[im 16/99  soft-tissue]
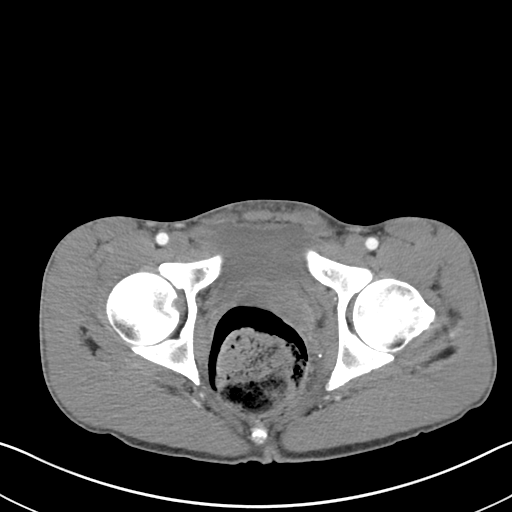
[im 24/99  soft-tissue]
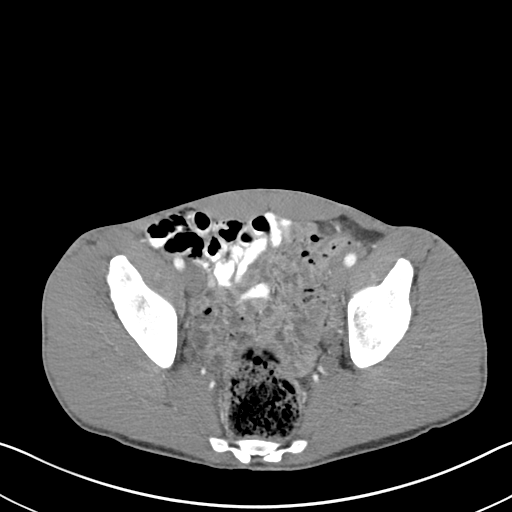
[im 32/99  soft-tissue]
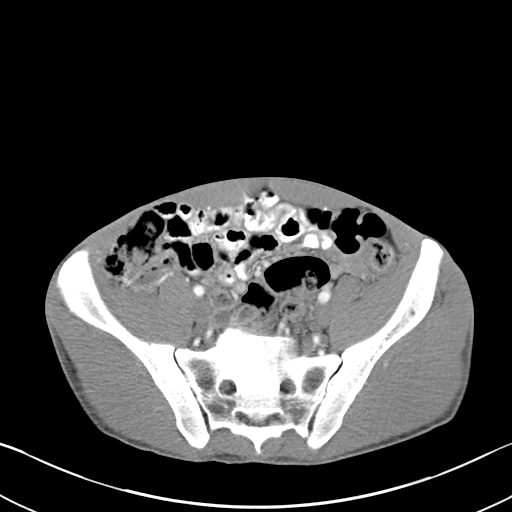
[im 40/99  soft-tissue]
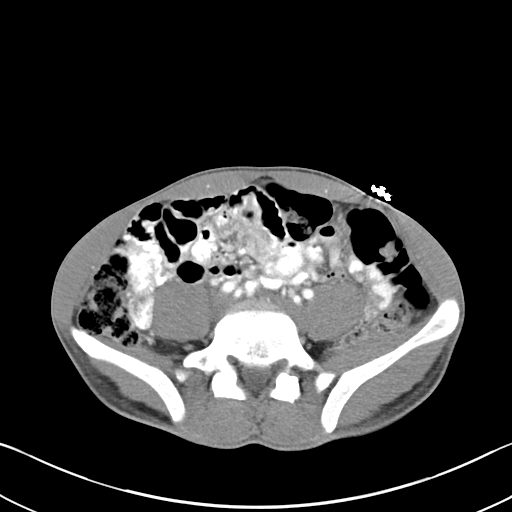
[im 48/99  soft-tissue]
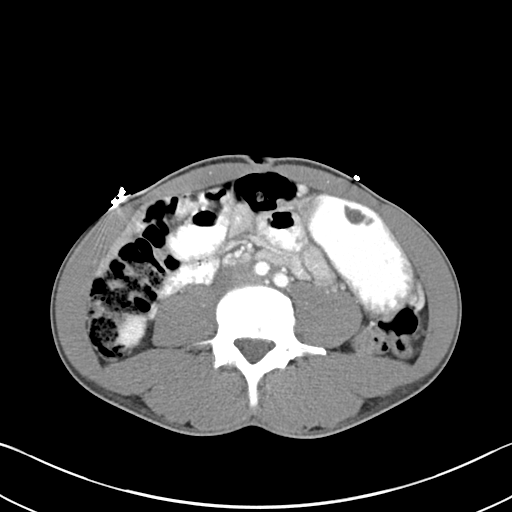
[im 55/99  soft-tissue]
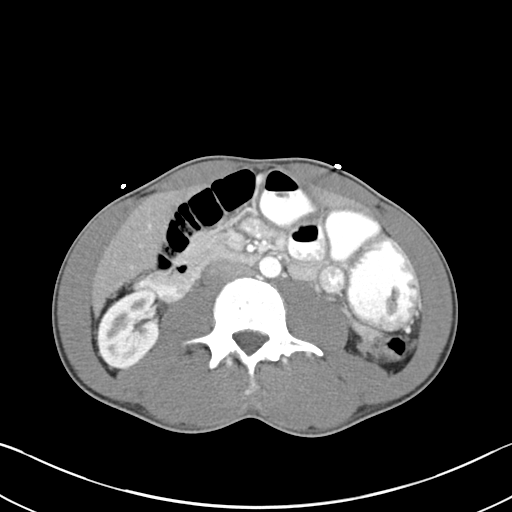
[im 63/99  soft-tissue]
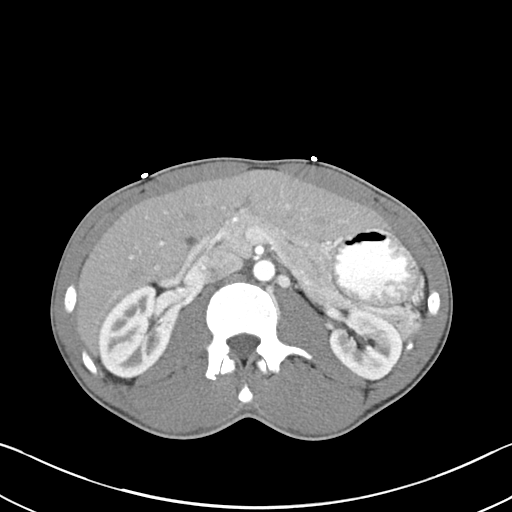
[im 71/99  soft-tissue]
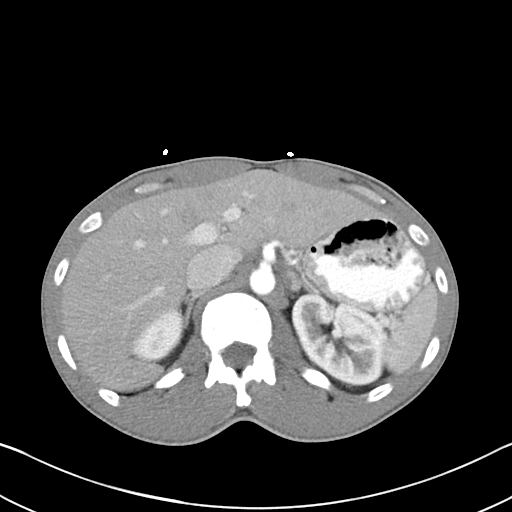
[im 71/99  bone]
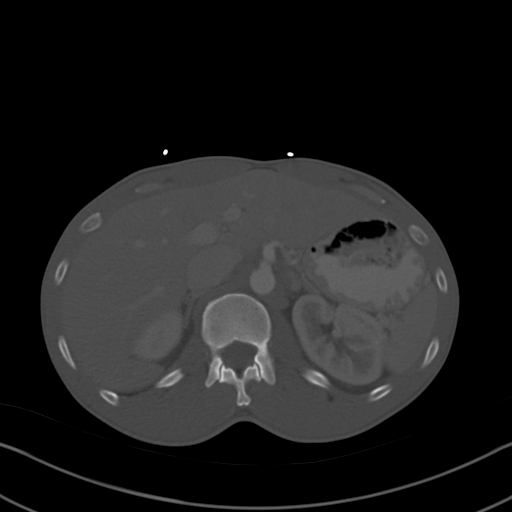
[im 79/99  soft-tissue]
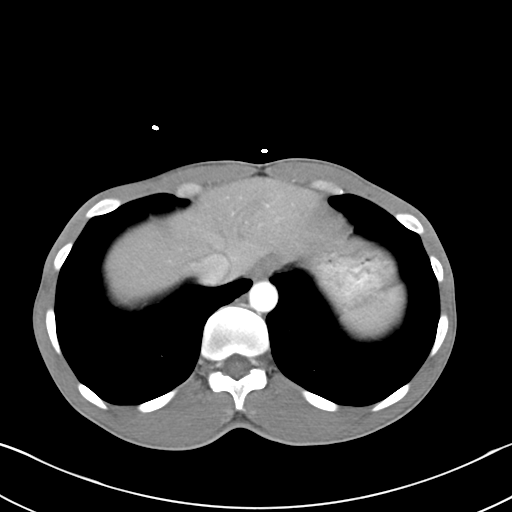
[im 87/99  soft-tissue]
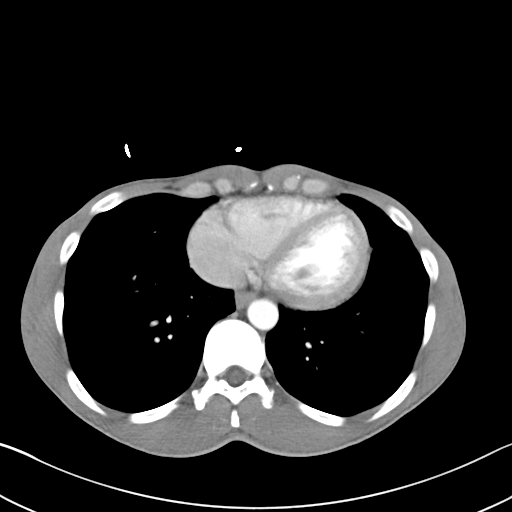
[im 95/99  soft-tissue]
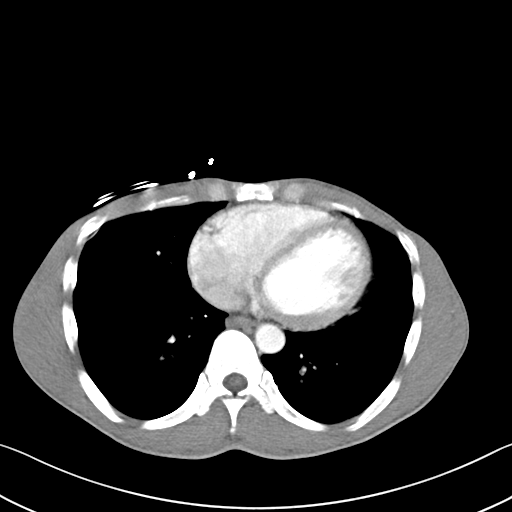

[Series 5: coronals · coronal · 0.59mm/px · 3 of 104 slices shown]
[im 35/104  soft-tissue]
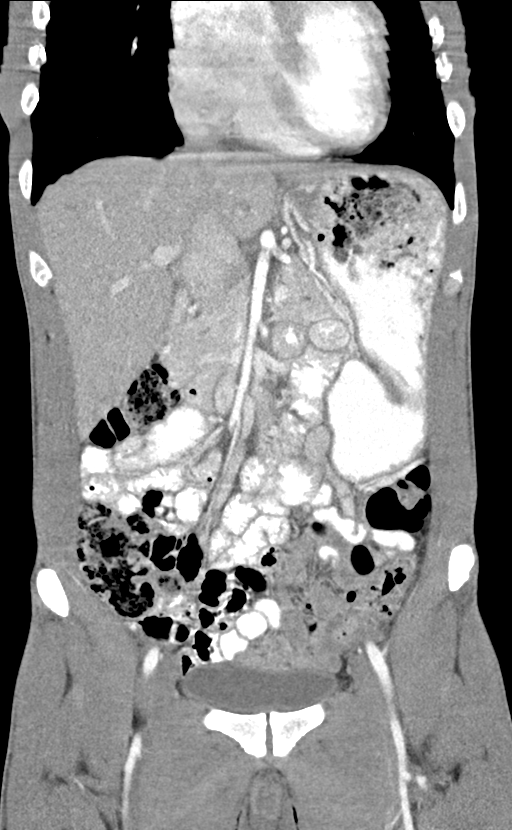
[im 46/104  soft-tissue]
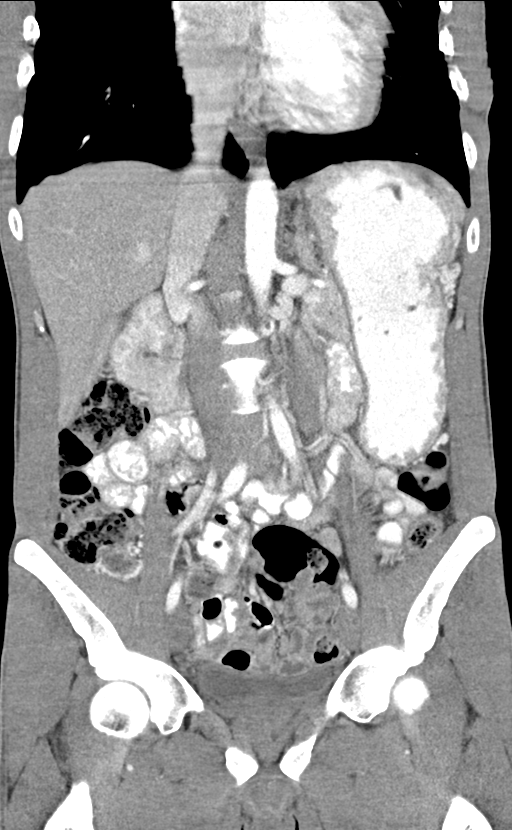
[im 58/104  soft-tissue]
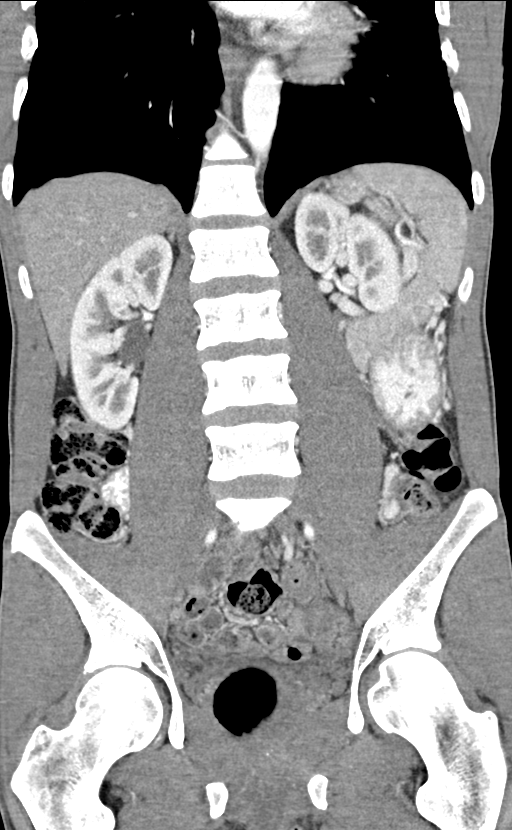

[15 of 46 positions shown; findings below may reference images not displayed]

FINDINGS: The lung bases are clear. There is no pleural or pericardial
effusion. Heart size is normal.

The gallbladder, liver, spleen, adrenal glands, pancreas and kidneys
all appear normal. There is no evidence of bowel obstruction.

There is a large volume of stool throughout the colon and a large
stool ball in the rectum. The stomach, small bowel and appendix
appear normal. There is no lymphadenopathy or fluid. Postoperative
change of left inguinal hernia repair is again seen. No lytic or
sclerotic bony lesion is identified. Loss of disc space height and
endplate spurring are seen at L5-S1.
IMPRESSION: No acute finding abdomen or pelvis.

Prominent stool burden throughout the colon with a large stool ball
in the rectum.

Degenerative disc disease L5-S1.

## 2016-11-23 ENCOUNTER — Encounter: Payer: Self-pay | Admitting: Adult Health

## 2016-11-23 ENCOUNTER — Ambulatory Visit (INDEPENDENT_AMBULATORY_CARE_PROVIDER_SITE_OTHER): Payer: BC Managed Care – PPO | Admitting: Adult Health

## 2016-11-23 VITALS — BP 127/82 | HR 73 | Ht 72.5 in | Wt 145.6 lb

## 2016-11-23 DIAGNOSIS — Z1329 Encounter for screening for other suspected endocrine disorder: Secondary | ICD-10-CM

## 2016-11-23 DIAGNOSIS — Z0000 Encounter for general adult medical examination without abnormal findings: Secondary | ICD-10-CM

## 2016-11-23 DIAGNOSIS — K921 Melena: Secondary | ICD-10-CM | POA: Diagnosis not present

## 2016-11-23 DIAGNOSIS — R351 Nocturia: Secondary | ICD-10-CM | POA: Diagnosis not present

## 2016-11-23 DIAGNOSIS — Z13220 Encounter for screening for lipoid disorders: Secondary | ICD-10-CM

## 2016-11-23 DIAGNOSIS — Z131 Encounter for screening for diabetes mellitus: Secondary | ICD-10-CM

## 2016-11-23 DIAGNOSIS — R42 Dizziness and giddiness: Secondary | ICD-10-CM | POA: Diagnosis not present

## 2016-11-23 DIAGNOSIS — M79645 Pain in left finger(s): Secondary | ICD-10-CM

## 2016-11-23 NOTE — Patient Instructions (Signed)
Heart-Healthy Eating Plan Many factors influence your heart health, including eating and exercise habits. Heart (coronary) risk increases with abnormal blood fat (lipid) levels. Heart-healthy meal planning includes limiting unhealthy fats, increasing healthy fats, and making other small dietary changes. This includes maintaining a healthy body weight to help keep lipid levels within a normal range. What is my plan? Your health care provider recommends that you:  Get no more than _25__% of the total calories in your daily diet from fat.  Limit your intake of saturated fat to less than _5_% of your total calories each day.  Limit the amount of cholesterol in your diet to less than _300__ mg per day.  What types of fat should I choose?  Choose healthy fats more often. Choose monounsaturated and polyunsaturated fats, such as olive oil and canola oil, flaxseeds, walnuts, almonds, and seeds.  Eat more omega-3 fats. Good choices include salmon, mackerel, sardines, tuna, flaxseed oil, and ground flaxseeds. Aim to eat fish at least two times each week.  Limit saturated fats. Saturated fats are primarily found in animal products, such as meats, butter, and cream. Plant sources of saturated fats include palm oil, palm kernel oil, and coconut oil.  Avoid foods with partially hydrogenated oils in them. These contain trans fats. Examples of foods that contain trans fats are stick margarine, some tub margarines, cookies, crackers, and other baked goods. What general guidelines do I need to follow?  Check food labels carefully to identify foods with trans fats or high amounts of saturated fat.  Fill one half of your plate with vegetables and green salads. Eat 4-5 servings of vegetables per day. A serving of vegetables equals 1 cup of raw leafy vegetables,  cup of raw or cooked cut-up vegetables, or  cup of vegetable juice.  Fill one fourth of your plate with whole grains. Look for the word "whole" as the  first word in the ingredient list.  Fill one fourth of your plate with lean protein foods.  Eat 4-5 servings of fruit per day. A serving of fruit equals one medium whole fruit,  cup of dried fruit,  cup of fresh, frozen, or canned fruit, or  cup of 100% fruit juice.  Eat more foods that contain soluble fiber. Examples of foods that contain this type of fiber are apples, broccoli, carrots, beans, peas, and barley. Aim to get 20-30 g of fiber per day.  Eat more home-cooked food and less restaurant, buffet, and fast food.  Limit or avoid alcohol.  Limit foods that are high in starch and sugar.  Avoid fried foods.  Cook foods by using methods other than frying. Baking, boiling, grilling, and broiling are all great options. Other fat-reducing suggestions include: ? Removing the skin from poultry. ? Removing all visible fats from meats. ? Skimming the fat off of stews, soups, and gravies before serving them. ? Steaming vegetables in water or broth.  Lose weight if you are overweight. Losing just 5-10% of your initial body weight can help your overall health and prevent diseases such as diabetes and heart disease.  Increase your consumption of nuts, legumes, and seeds to 4-5 servings per week. One serving of dried beans or legumes equals  cup after being cooked, one serving of nuts equals 1 ounces, and one serving of seeds equals  ounce or 1 tablespoon.  You may need to monitor your salt (sodium) intake, especially if you have high blood pressure. Talk with your health care provider or dietitian to get  more information about reducing sodium. What foods can I eat? Grains  Breads, including [place], white, pita, wheat, raisin, rye, oatmeal, and [place]. Tortillas that are neither fried nor made with lard or trans fat. Low-fat rolls, including hotdog and hamburger buns and English muffins. Biscuits. Muffins. Waffles. Pancakes. Light popcorn. Whole-grain cereals. Flatbread. Melba toast.  Pretzels. Breadsticks. Rusks. Low-fat snacks and crackers, including oyster, saltine, matzo, graham, animal, and rye. Rice and pasta, including brown rice and those that are made with whole wheat. Vegetables All vegetables. Fruits All fruits, but limit coconut. Meats and Other Protein Sources Lean, well-trimmed beef, veal, pork, and lamb. Chicken and [place] without skin. All fish and shellfish. Wild duck, rabbit, pheasant, and venison. Egg whites or low-cholesterol egg substitutes. Dried beans, peas, lentils, and tofu.Seeds and most nuts. Dairy Low-fat or nonfat cheeses, including ricotta, string, and mozzarella. Skim or 1% milk that is liquid, powdered, or evaporated. Buttermilk that is made with low-fat milk. Nonfat or low-fat yogurt. Beverages Mineral water. Diet carbonated beverages. Sweets and Desserts Sherbets and fruit ices. Honey, jam, marmalade, jelly, and syrups. Meringues and gelatins. Pure sugar candy, such as hard candy, jelly beans, gumdrops, mints, marshmallows, and small amounts of dark chocolate. [employer] Eat all sweets and desserts in moderation. Fats and Oils Nonhydrogenated (trans-free) margarines. Vegetable oils, including soybean, sesame, sunflower, olive, peanut, safflower, corn, canola, and cottonseed. Salad dressings or mayonnaise that are made with a vegetable oil. Limit added fats and oils that you use for cooking, baking, salads, and as spreads. Other Cocoa powder. Coffee and tea. All seasonings and condiments. The items listed above may not be a complete list of recommended foods or beverages. Contact your dietitian for more options. What foods are not recommended? Grains Breads that are made with saturated or trans fats, oils, or whole milk. Croissants. Butter rolls. Cheese breads. Sweet rolls. Donuts. Buttered popcorn. Chow mein noodles. High-fat crackers, such as cheese or butter crackers. Meats and Other Protein Sources Fatty meats, such as hotdogs,  short ribs, sausage, spareribs, bacon, ribeye roast or steak, and mutton. High-fat deli meats, such as salami and bologna. Caviar. Domestic duck and goose. Organ meats, such as kidney, liver, sweetbreads, brains, gizzard, chitterlings, and heart. Dairy Cream, sour cream, cream cheese, and creamed cottage cheese. Whole milk cheeses, including blue (bleu), Monterey Jack, [place], [place], American, [place], Swiss, [place], [place], and [place]. Whole or 2% milk that is liquid, evaporated, or condensed. Whole buttermilk. Cream sauce or high-fat cheese sauce. Yogurt that is made from whole milk. Beverages Regular sodas and drinks with added sugar. Sweets and Desserts Frosting. Pudding. Cookies. Cakes other than angel food cake. Candy that has milk chocolate or white chocolate, hydrogenated fat, butter, coconut, or unknown ingredients. Buttered syrups. Full-fat ice cream or ice cream drinks. Fats and Oils Gravy that has suet, meat fat, or shortening. Cocoa butter, hydrogenated oils, palm oil, coconut oil, palm kernel oil. These can often be found in baked products, candy, fried foods, nondairy creamers, and whipped toppings. Solid fats and shortenings, including bacon fat, salt pork, lard, and butter. Nondairy cream substitutes, such as coffee creamers and sour cream substitutes. Salad dressings that are made of unknown oils, cheese, or sour cream. The items listed above may not be a complete list of foods and beverages to avoid. Contact your dietitian for more information. This information is not intended to replace advice given to you by your health care provider. Make sure you discuss any questions you have with your health care  provider. Document Released: 12/16/2007 Document Revised: 09/26/2015 Document Reviewed: 08/30/2013 Elsevier Interactive Patient Education  2017 Elsevier Inc.  -Try not to drink water after 7pm nightly. -Try to eat small meals/snacks every 2-3 hours. -Apply ice to L finger for  20mins several times daily.  OTC Ibuprofen per manufacture's instructions. -GI referral placed. -Please schedule fasting labs and full physical in the next 4 weeks. WELCOME TO THE PRACTICE!

## 2016-11-23 NOTE — Progress Notes (Signed)
Subjective:    Patient ID: Isaiah Heath, male    DOB: Sep 06, 1980, 36 y.o.   MRN: [id number]  HPI:  [name] is here to establish as a new pt.  He is a very pleasant 36 year old male. PMH:  2010 L hernia repair, 2015 bowel obstruction, 2017 L hernia adhesion removal.  He reports intermittent hematochezia over the lat 12 months. He was seen at ED 5/18 for upper abdominal pain, CT negative for acute process. He was instructed to f/u with GI-he did not.  Review of prior ED visits- majority r/t to abdominal complaints. He reports drinking >100 ounces water/day and last drink around 2200.  He reports interrupted sleep r/t nocturia, 6-7 voids/night.  He reports that this has been occurring >4 years and he denies family hx of prostate ca.   He reports poor appetite and eating only minimal amount of food daily.  He has been experiencing periods of dizziness that started a few weeks ago and he reports falling in the middle of the night and striking his head on bathroom wall 5-6 weeks ago.  He also reports experiencing increased anxiety/depression over the last year.  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  He smokes 6 cigarettes/day and denies illicit drug use or excessive EOTH use.  He is married to a very supportive wife and reports a strong support system of family/friends. He also reports L 3rd finger pain that developed >3 months ago, pain is intermittent rated 7/10 and described as a dull ache. He denies medications on regular/routine basis. He declined CBT referral today. He reports weighing in mid 140s for the last few years.  Patient Care Team    Relationship Specialty Notifications Start End  [place], [place], [place] PCP - General Family Medicine  03/02/16     Patient Active Problem List   Diagnosis Date Noted  . Hematochezia 11/23/2016  . Healthcare maintenance 11/23/2016  . Nocturia  11/23/2016  . Pain of left middle finger 11/23/2016  . Dizziness 11/23/2016     Past Medical History:  Diagnosis Date  . Anxiety   . Constipation   . Diverticulitis   . Ear infection   . Inguinal hernia, left      Past Surgical History:  Procedure Laterality Date  . HERNIA REPAIR Left [place], [place] [facility] surgery, Dr. Nelda Marseille  . INGUINAL HERNIA REPAIR Left 03/03/2016   Procedure: LEFT INGUNIAL HERNIA EXPLORATION;  Surgeon: Jimmye Norman, MD;  Location: [facility] OR;  Service: General;  Laterality: Left;  . PROSTATE BIOPSY       Family History  Problem Relation Age of Onset  . Diabetes Mother   . Hypertension Mother   . Stroke Father   . Hypertension Father   . Diabetes Maternal Grandmother   . Hypertension Maternal Grandfather   . Hypertension Paternal Grandfather      History  Drug Use No     History  Alcohol Use  . Yes    Comment: rare     History  Smoking Status  . Current Some Day Smoker  . Packs/day: 0.25  . Years: 10.00  Smokeless Tobacco  . Never Used     Outpatient Encounter Prescriptions as of 11/23/2016  Medication Sig  . [DISCONTINUED] Aspirin-Acetaminophen-Caffeine (GOODY HEADACHE PO) Take 1 packet by mouth daily as needed (headache).  . [DISCONTINUED] lansoprazole (PREVACID SOLUTAB) 30 MG disintegrating tablet Take 1 tablet (30  mg total) by mouth daily.  . [DISCONTINUED] traMADol (ULTRAM) 50 MG tablet Take 1 tablet (50 mg total) by mouth every 6 (six) hours as needed.   No facility-administered encounter medications on file as of 11/23/2016.     Allergies: No known allergies  Body mass index is 19.48 kg/m.  Blood pressure 127/82, pulse 73, height 6' 0.5" (1.842 m), weight 145 lb 9.6 oz (66 kg).     Review of Systems  Constitutional: Positive for fatigue. Negative for activity change, appetite change, chills, diaphoresis, fever and unexpected weight change.  HENT: Negative for congestion.   Eyes: Negative for visual  disturbance.  Respiratory: Negative for cough, chest tightness, shortness of breath, wheezing and stridor.   Cardiovascular: Negative for chest pain, palpitations and leg swelling.  Gastrointestinal: Positive for blood in stool. Negative for abdominal distention, abdominal pain, constipation, diarrhea, nausea and vomiting.  Endocrine: Negative for cold intolerance, heat intolerance, polydipsia, polyphagia and polyuria.  Genitourinary: Positive for frequency. Negative for difficulty urinating, dysuria, flank pain and urgency.  Musculoskeletal: Positive for myalgias.  Skin: Negative for color change, pallor, rash and wound.  Neurological: Negative for dizziness, tremors, weakness and light-headedness.  Hematological: Does not bruise/bleed easily.  Psychiatric/Behavioral: Positive for dysphoric mood and sleep disturbance. Negative for confusion, decreased concentration, hallucinations, self-injury and suicidal ideas. The patient is nervous/anxious. The patient is not hyperactive.        Objective:   Physical Exam  Constitutional: He is oriented to person, place, and time. He appears well-developed and well-nourished. No distress.  HENT:  Head: Normocephalic and atraumatic.  Right Ear: Hearing, external ear and ear canal normal. Tympanic membrane is bulging. Tympanic membrane is not erythematous.  Left Ear: Hearing, external ear and ear canal normal. Tympanic membrane is bulging. Tympanic membrane is not erythematous. No decreased hearing is noted.  Nose: Nose normal. Right sinus exhibits no maxillary sinus tenderness and no frontal sinus tenderness. Left sinus exhibits no maxillary sinus tenderness and no frontal sinus tenderness.  Mouth/Throat: Uvula is midline, oropharynx is clear and moist and mucous membranes are normal.  Eyes: Pupils are equal, round, and reactive to light. Conjunctivae are normal.  Neck: Normal range of motion. Neck supple.  Cardiovascular: Normal rate, regular rhythm,  normal heart sounds and intact distal pulses.   No murmur heard. Pulmonary/Chest: Effort normal and breath sounds normal. No respiratory distress. He has no wheezes. He has no rales. He exhibits no tenderness.  Musculoskeletal: He exhibits tenderness. He exhibits no edema or deformity.       Left hand: He exhibits tenderness and swelling. He exhibits normal range of motion, normal capillary refill and no deformity. Normal strength noted.  Mild TTP with minor swelling at L 3rd finger over PIP   Lymphadenopathy:    He has no cervical adenopathy.  Neurological: He is alert and oriented to person, place, and time. Coordination normal.  Skin: Skin is warm and dry. No rash noted. He is not diaphoretic. No erythema. No pallor.  Psychiatric: He has a normal mood and affect. His behavior is normal. Judgment and thought content normal.  Nursing note and vitals reviewed.         Assessment & Plan:   1. Hematochezia   2. Screening for diabetes mellitus   3. Screening cholesterol level   4. Screening for thyroid disorder   5. Healthcare maintenance   6. Nocturia   7. Pain of left middle finger   8. Dizziness     Hematochezia Present for >  12 months. GI referral placed.   Healthcare maintenance GI referral placed. Please schedule fasting labs and full physical in the next 4 weeks  Nocturia Try not to drink water after 7pm nightly. If nocturia does not improve from reducing evening water intake, will refer to Urology.  Pain of left middle finger Apply ice to L finger for 20mins several times daily.  OTC Ibuprofen per manufacture's instructions. If pain/stiffness persist then will refer to Orthopedics.  Dizziness Try to eat small meals/snacks every 2-3 hours. Continue excellent hydration. Will check A1c at CPE.     FOLLOW-UP:  Return in about 4 weeks (around 12/21/2016) for CPE, Fasting Lab Draw.

## 2016-11-23 NOTE — Assessment & Plan Note (Addendum)
Present for >12 months. GI referral placed.

## 2016-11-23 NOTE — Assessment & Plan Note (Signed)
GI referral placed. Please schedule fasting labs and full physical in the next 4 weeks

## 2016-11-23 NOTE — Assessment & Plan Note (Signed)
Try not to drink water after 7pm nightly. If nocturia does not improve from reducing evening water intake, will refer to Urology.

## 2016-11-23 NOTE — Assessment & Plan Note (Signed)
Try to eat small meals/snacks every 2-3 hours. Continue excellent hydration. Will check A1c at CPE.

## 2016-11-23 NOTE — Assessment & Plan Note (Addendum)
Apply ice to L finger for several times daily.  OTC Ibuprofen per manufacture's instructions. If pain/stiffness persist then will refer to Orthopedics.

## 2016-12-01 ENCOUNTER — Encounter: Payer: Self-pay | Admitting: Adult Health

## 2016-12-29 ENCOUNTER — Ambulatory Visit (INDEPENDENT_AMBULATORY_CARE_PROVIDER_SITE_OTHER): Payer: BC Managed Care – PPO | Admitting: Adult Health

## 2016-12-29 ENCOUNTER — Encounter: Payer: Self-pay | Admitting: Adult Health

## 2016-12-29 ENCOUNTER — Ambulatory Visit: Payer: BC Managed Care – PPO

## 2016-12-29 VITALS — BP 121/80 | HR 65 | Ht 72.5 in | Wt 145.0 lb

## 2016-12-29 DIAGNOSIS — M25511 Pain in right shoulder: Secondary | ICD-10-CM

## 2016-12-29 MED ORDER — IBUPROFEN 200 MG PO TABS
800.0000 mg | ORAL_TABLET | Freq: Once | ORAL | Status: AC
  Administered 2016-12-29: 800 mg via ORAL

## 2016-12-29 MED ORDER — PREDNISONE 20 MG PO TABS: ORAL_TABLET | ORAL | 0 refills | Status: DC

## 2016-12-29 MED ORDER — CYCLOBENZAPRINE HCL 10 MG PO TABS: 10.0000 mg | ORAL_TABLET | Freq: Three times a day (TID) | ORAL | 0 refills | Status: DC | PRN

## 2016-12-29 NOTE — Assessment & Plan Note (Addendum)
  Ibuprofen/ice pack provided in office. R shoulder complete Xray-negative Apply ice for 20 mins several times daily. Rest R shoulder. Work excuse provided until 01/03/2017. Prednisone/Cyclobenzaprine provided-take as directed. OTC NSAIDs PRN pain. F/u 01/03/2017

## 2016-12-29 NOTE — Patient Instructions (Signed)
Shoulder Pain Many things can cause shoulder pain, including:  An injury to the area.  Overuse of the shoulder.  Arthritis.  The source of the pain can be:  Inflammation.  An injury to the shoulder joint.  An injury to a tendon, ligament, or bone.  Follow these instructions at home: Take these actions to help with your pain:  Squeeze a soft ball or a foam pad as much as possible. This helps to keep the shoulder from swelling. It also helps to strengthen the arm.  Take over-the-counter and prescription medicines only as told by your health care provider.  If directed, apply ice to the area: ? Put ice in a plastic bag. ? Place a towel between your skin and the bag. ? Leave the ice on for 20 minutes, 2-3 times per day. Stop applying ice if it does not help with the pain.  If you were given a shoulder sling or immobilizer: ? Wear it as told. ? Remove it to shower or bathe. ? Move your arm as little as possible, but keep your hand moving to prevent swelling.  Contact a health care provider if:  Your pain gets worse.  Your pain is not relieved with medicines.  New pain develops in your arm, hand, or fingers. Get help right away if:  Your arm, hand, or fingers: ? Tingle. ? Become numb. ? Become swollen. ? Become painful. ? Turn white or blue. This information is not intended to replace advice given to you by your health care provider. Make sure you discuss any questions you have with your health care provider. Document Released: 12/16/2004 Document Revised: 11/02/2015 Document Reviewed: 07/01/2014 Elsevier Interactive Patient Education  2017 Elsevier Inc.  Apply ice for several times daily. Rest R shoulder/arm as often as possible. Work excuse provided until 01/03/2017. Cyclobenzaprine  every 8 hrs as needed for muscle spasm-do not drink alcohol/drive while taking. Prednisone  twice daily for 5 days. OTC Ibuprofen per manufacturer's instructions as  needed for pain. Follow-up on Monday. FEEL BETTER!
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
## 2017-01-03 ENCOUNTER — Encounter: Payer: Self-pay | Admitting: Adult Health

## 2017-01-03 ENCOUNTER — Ambulatory Visit (INDEPENDENT_AMBULATORY_CARE_PROVIDER_SITE_OTHER): Payer: BC Managed Care – PPO | Admitting: Adult Health

## 2017-01-03 ENCOUNTER — Encounter: Payer: Self-pay | Admitting: Gastroenterology

## 2017-01-03 DIAGNOSIS — H6121 Impacted cerumen, right ear: Secondary | ICD-10-CM

## 2017-01-03 DIAGNOSIS — M25511 Pain in right shoulder: Secondary | ICD-10-CM | POA: Diagnosis not present

## 2017-01-03 DIAGNOSIS — Z0000 Encounter for general adult medical examination without abnormal findings: Secondary | ICD-10-CM | POA: Diagnosis not present

## 2017-01-03 DIAGNOSIS — K921 Melena: Secondary | ICD-10-CM

## 2017-01-03 DIAGNOSIS — Z13220 Encounter for screening for lipoid disorders: Secondary | ICD-10-CM

## 2017-01-03 DIAGNOSIS — Z131 Encounter for screening for diabetes mellitus: Secondary | ICD-10-CM

## 2017-01-03 DIAGNOSIS — Z1329 Encounter for screening for other suspected endocrine disorder: Secondary | ICD-10-CM | POA: Diagnosis not present

## 2017-01-03 MED ORDER — NICOTINE 14 MG/24HR TD PT24: 14.0000 mg | MEDICATED_PATCH | Freq: Every day | TRANSDERMAL | 0 refills | Status: DC

## 2017-01-03 NOTE — Assessment & Plan Note (Addendum)
Fasting labs obtained. Continue to drink at least 70 ounces water/day and eat a healthy diet. Unable to perform DRE. Another referral to GI placed ( 2 attempts to schedule an appt went unanswered and GI closed referral). He declined influenza, Tdap, and HIV screening. Annual follow-up, sooner if needed.

## 2017-01-03 NOTE — Patient Instructions (Addendum)
Heart-Healthy Eating Plan Many factors influence your heart health, including eating and exercise habits. Heart (coronary) risk increases with abnormal blood fat (lipid) levels. Heart-healthy meal planning includes limiting unhealthy fats, increasing healthy fats, and making other small dietary changes. This includes maintaining a healthy body weight to help keep lipid levels within a normal range. What is my plan? Your health care provider recommends that you:  Get no more than _25__% of the total calories in your daily diet from fat.  Limit your intake of saturated fat to less than __5__% of your total calories each day.  Limit the amount of cholesterol in your diet to less than __300__ mg per day.  What types of fat should I choose?  Choose healthy fats more often. Choose monounsaturated and polyunsaturated fats, such as olive oil and canola oil, flaxseeds, walnuts, almonds, and seeds.  Eat more omega-3 fats. Good choices include salmon, mackerel, sardines, tuna, flaxseed oil, and ground flaxseeds. Aim to eat fish at least two times each week.  Limit saturated fats. Saturated fats are primarily found in animal products, such as meats, butter, and cream. Plant sources of saturated fats include palm oil, palm kernel oil, and coconut oil.  Avoid foods with partially hydrogenated oils in them. These contain trans fats. Examples of foods that contain trans fats are stick margarine, some tub margarines, cookies, crackers, and other baked goods. What general guidelines do I need to follow?  Check food labels carefully to identify foods with trans fats or high amounts of saturated fat.  Fill one half of your plate with vegetables and green salads. Eat 4-5 servings of vegetables per day. A serving of vegetables equals 1 cup of raw leafy vegetables,  cup of raw or cooked cut-up vegetables, or  cup of vegetable juice.  Fill one fourth of your plate with whole grains. Look for the word "whole" as  the first word in the ingredient list.  Fill one fourth of your plate with lean protein foods.  Eat 4-5 servings of fruit per day. A serving of fruit equals one medium whole fruit,  cup of dried fruit,  cup of fresh, frozen, or canned fruit, or  cup of 100% fruit juice.  Eat more foods that contain soluble fiber. Examples of foods that contain this type of fiber are apples, broccoli, carrots, beans, peas, and barley. Aim to get 20-30 g of fiber per day.  Eat more home-cooked food and less restaurant, buffet, and fast food.  Limit or avoid alcohol.  Limit foods that are high in starch and sugar.  Avoid fried foods.  Cook foods by using methods other than frying. Baking, boiling, grilling, and broiling are all great options. Other fat-reducing suggestions include: ? Removing the skin from poultry. ? Removing all visible fats from meats. ? Skimming the fat off of stews, soups, and gravies before serving them. ? Steaming vegetables in water or broth.  Lose weight if you are overweight. Losing just 5-10% of your initial body weight can help your overall health and prevent diseases such as diabetes and heart disease.  Increase your consumption of nuts, legumes, and seeds to 4-5 servings per week. One serving of dried beans or legumes equals  cup after being cooked, one serving of nuts equals 1 ounces, and one serving of seeds equals  ounce or 1 tablespoon.  You may need to monitor your salt (sodium) intake, especially if you have high blood pressure. Talk with your health care provider or dietitian to get  more information about reducing sodium. What foods can I eat? Grains  Breads, including [place], white, pita, wheat, raisin, rye, oatmeal, and [place]. Tortillas that are neither fried nor made with lard or trans fat. Low-fat rolls, including hotdog and hamburger buns and English muffins. Biscuits. Muffins. Waffles. Pancakes. Light popcorn. Whole-grain cereals. Flatbread. Melba toast.  Pretzels. Breadsticks. Rusks. Low-fat snacks and crackers, including oyster, saltine, matzo, graham, animal, and rye. Rice and pasta, including brown rice and those that are made with whole wheat. Vegetables All vegetables. Fruits All fruits, but limit coconut. Meats and Other Protein Sources Lean, well-trimmed beef, veal, pork, and lamb. Chicken and [place] without skin. All fish and shellfish. Wild duck, rabbit, pheasant, and venison. Egg whites or low-cholesterol egg substitutes. Dried beans, peas, lentils, and tofu.Seeds and most nuts. Dairy Low-fat or nonfat cheeses, including ricotta, string, and mozzarella. Skim or 1% milk that is liquid, powdered, or evaporated. Buttermilk that is made with low-fat milk. Nonfat or low-fat yogurt. Beverages Mineral water. Diet carbonated beverages. Sweets and Desserts Sherbets and fruit ices. Honey, jam, marmalade, jelly, and syrups. Meringues and gelatins. Pure sugar candy, such as hard candy, jelly beans, gumdrops, mints, marshmallows, and small amounts of dark chocolate. [employer] Eat all sweets and desserts in moderation. Fats and Oils Nonhydrogenated (trans-free) margarines. Vegetable oils, including soybean, sesame, sunflower, olive, peanut, safflower, corn, canola, and cottonseed. Salad dressings or mayonnaise that are made with a vegetable oil. Limit added fats and oils that you use for cooking, baking, salads, and as spreads. Other Cocoa powder. Coffee and tea. All seasonings and condiments. The items listed above may not be a complete list of recommended foods or beverages. Contact your dietitian for more options. What foods are not recommended? Grains Breads that are made with saturated or trans fats, oils, or whole milk. Croissants. Butter rolls. Cheese breads. Sweet rolls. Donuts. Buttered popcorn. Chow mein noodles. High-fat crackers, such as cheese or butter crackers. Meats and Other Protein Sources Fatty meats, such as hotdogs,  short ribs, sausage, spareribs, bacon, ribeye roast or steak, and mutton. High-fat deli meats, such as salami and bologna. Caviar. Domestic duck and goose. Organ meats, such as kidney, liver, sweetbreads, brains, gizzard, chitterlings, and heart. Dairy Cream, sour cream, cream cheese, and creamed cottage cheese. Whole milk cheeses, including blue (bleu), Monterey Jack, [place], [place], American, [place], Swiss, [place], [place], and [place]. Whole or 2% milk that is liquid, evaporated, or condensed. Whole buttermilk. Cream sauce or high-fat cheese sauce. Yogurt that is made from whole milk. Beverages Regular sodas and drinks with added sugar. Sweets and Desserts Frosting. Pudding. Cookies. Cakes other than angel food cake. Candy that has milk chocolate or white chocolate, hydrogenated fat, butter, coconut, or unknown ingredients. Buttered syrups. Full-fat ice cream or ice cream drinks. Fats and Oils Gravy that has suet, meat fat, or shortening. Cocoa butter, hydrogenated oils, palm oil, coconut oil, palm kernel oil. These can often be found in baked products, candy, fried foods, nondairy creamers, and whipped toppings. Solid fats and shortenings, including bacon fat, salt pork, lard, and butter. Nondairy cream substitutes, such as coffee creamers and sour cream substitutes. Salad dressings that are made of unknown oils, cheese, or sour cream. The items listed above may not be a complete list of foods and beverages to avoid. Contact your dietitian for more information. This information is not intended to replace advice given to you by your health care provider. Make sure you discuss any questions you have with your health care  provider. Document Released: 12/16/2007 Document Revised: 09/26/2015 Document Reviewed: 08/30/2013 Elsevier Interactive Patient Education  2017 [employer]  Nicotine skin patches What is this medicine? NICOTINE (NIK oh teen) helps people stop smoking. The patches replace the  nicotine found in cigarettes and help to decrease withdrawal effects. They are most effective when used in combination with a stop-smoking program. This medicine may be used for other purposes; ask your health care provider or pharmacist if you have questions. COMMON BRAND NAME(S): Habitrol, Nicoderm CQ, Nicotrol What should I tell my health care provider before I take this medicine? They need to know if you have any of these conditions: -diabetes -heart disease, angina, irregular heartbeat or previous heart attack -high blood pressure -lung disease, including asthma -overactive thyroid -pheochromocytoma -seizures or a history of seizures -skin problems, like eczema -stomach problems or ulcers -an unusual or allergic reaction to nicotine, adhesives, other medicines, foods, dyes, or preservatives -pregnant or trying to get pregnant -breast-feeding How should I use this medicine? This medicine is for use on the skin. Follow the directions that come with the patches. Find an area of skin on your upper arm, chest, or back that is clean, dry, greaseless, undamaged and hairless. Wash hands with plain soap and water. Do not use anything that contains aloe, lanolin or glycerin as these may prevent the patch from sticking. Dry thoroughly. Remove the patch from the sealed pouch. Do not try to cut or trim the patch. Using your palm, press the patch firmly in place for 10 seconds to make sure that there is good contact with your skin. After applying the patch, wash your hands. Change the patch every day, keeping to a regular schedule. When you apply a new patch, use a new area of skin. Wait at least 1 week before using the same area again. Talk to your pediatrician regarding the use of this medicine in children. Special care may be needed. Overdosage: If you think you have taken too much of this medicine contact a poison control center or emergency room at once. NOTE: This medicine is only for you. Do not  share this medicine with others. What if I miss a dose? If you forget to replace a patch, use it as soon as you can. Only use one patch at a time and do not leave on the skin for longer than directed. If a patch falls off, you can replace it, but keep to your schedule and remove the patch at the right time. What may interact with this medicine? -medicines for asthma -medicines for blood pressure -medicines for mental depression This list may not describe all possible interactions. Give your health care provider a list of all the medicines, herbs, non-prescription drugs, or dietary supplements you use. Also tell them if you smoke, drink alcohol, or use illegal drugs. Some items may interact with your medicine. What should I watch for while using this medicine? You should begin using the nicotine patch the day you stop smoking. It is okay if you do not succeed at your attempt to quit and have a cigarette. You can still continue your quit attempt and keep using the product as directed. Just throw away your cigarettes and get back to your quit plan. You can keep the patch in place during swimming, bathing, and showering. If your patch falls off during these activities, replace it. When you first apply the patch, your skin may itch or burn. This should go away soon. When you remove a patch, the  skin may look red, but this should only last for a few days. Call your doctor or health care professional if skin redness does not go away after 4 days, if your skin swells, or if you get a rash. If you are a diabetic and you quit smoking, the effects of insulin may be increased and you may need to reduce your insulin dose. Check with your doctor or health care professional about how you should adjust your insulin dose. If you are going to have a magnetic resonance imaging (MRI) procedure, tell your MRI technician if you have this patch on your body. It must be removed before a MRI. What side effects may I notice  from receiving this medicine? Side effects that you should report to your doctor or health care professional as soon as possible: -allergic reactions like skin rash, itching or hives, swelling of the face, lips, or tongue -breathing problems -changes in hearing -changes in vision -chest pain -cold sweats -confusion -fast, irregular heartbeat -feeling faint or lightheaded, falls -headache -increased saliva -skin redness that lasts more than 4 days -stomach pain -signs and symptoms of nicotine overdose like nausea; vomiting; dizziness; weakness; and rapid heartbeat Side effects that usually do not require medical attention (report to your doctor or health care professional if they continue or are bothersome): -diarrhea -dry mouth -hiccups -irritability -nervousness or restlessness -trouble sleeping or vivid dreams This list may not describe all possible side effects. Call your doctor for medical advice about side effects. You may report side effects to FDA at 1-800-FDA-1088. Where should I keep my medicine? Keep out of the reach of children. Store at room temperature between 20 and 25 degrees C (68 and 77 degrees F). Protect from heat and light. Store in Tax inspector until ready to use. Throw away unused medicine after the expiration date. When you remove a patch, fold with sticky sides together; put in an empty opened pouch and throw away. NOTE: This sheet is a summary. It may not cover all possible information. If you have questions about this medicine, talk to your doctor, pharmacist, or health care provider.  2018 Elsevier/Gold Standard (2014-02-04 15:46:21)   Overall you are doing a GREAT JOB! Keep drinking plenty of water and eating healthy diet. New referral to GI placed, please make appt at your earliest convenience. We will call when lab results are available. Annual follow-up, sooner if needed. NICE TO SEE YOU!

## 2017-01-03 NOTE — Assessment & Plan Note (Signed)
GI attempted twice to schedule new pt appt- unsuccessful in contacting him. New GI referral placed. Unable to perform DRE, guaiac testing- pt clenched anus and was impossible to pass finger by sphincter. Chaperone present during examination.

## 2017-01-03 NOTE — Assessment & Plan Note (Signed)
Ear flushed with rhino system- completely clear and "clogged sensation" stopped.

## 2017-01-03 NOTE — Assessment & Plan Note (Signed)
Denies pain at rest and minimal pain with certain movements (3/10). He has completed prednisone course and only using cyclobenzaprine sparingly. Denies numbness/tingling in R hand. Does not need work note to be released back to full duty.
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
## 2017-01-04 ENCOUNTER — Other Ambulatory Visit: Payer: Self-pay | Admitting: Adult Health

## 2017-01-04 DIAGNOSIS — E559 Vitamin D deficiency, unspecified: Secondary | ICD-10-CM

## 2017-01-04 LAB — COMPREHENSIVE METABOLIC PANEL
A/G RATIO: 1.6 (ref 1.2–2.2)
ALBUMIN: 4.2 g/dL (ref 3.5–5.5)
ALK PHOS: 62 IU/L (ref 39–117)
ALT: 15 IU/L (ref 0–44)
AST: 21 IU/L (ref 0–40)
BILIRUBIN TOTAL: 0.4 mg/dL (ref 0.0–1.2)
BUN/Creatinine Ratio: 8 — ABNORMAL LOW (ref 9–20)
BUN: 8 mg/dL (ref 6–20)
CO2: 27 mmol/L (ref 20–29)
Calcium: 9.7 mg/dL (ref 8.7–10.2)
Chloride: 105 mmol/L (ref 96–106)
Creatinine, Ser: 0.98 mg/dL (ref 0.76–1.27)
GFR calc Af Amer: 113 mL/min/{1.73_m2} (ref >59)
GFR calc non Af Amer: 97 mL/min/{1.73_m2} (ref >59)
GLOBULIN, TOTAL: 2.6 g/dL (ref 1.5–4.5)
GLUCOSE: 107 mg/dL — AB (ref 65–99)
Potassium: 4.6 mmol/L (ref 3.5–5.2)
Sodium: 143 mmol/L (ref 134–144)
TOTAL PROTEIN: 6.8 g/dL (ref 6.0–8.5)

## 2017-01-04 LAB — CBC WITH DIFFERENTIAL
BASOS ABS: 0 10*3/uL (ref 0.0–0.2)
Basos: 1 %
EOS (ABSOLUTE): 0.5 10*3/uL — ABNORMAL HIGH (ref 0.0–0.4)
Eos: 10 %
HEMOGLOBIN: 13.5 g/dL (ref 13.0–17.7)
Hematocrit: 40 % (ref 37.5–51.0)
IMMATURE GRANULOCYTES: 0 %
Immature Grans (Abs): 0 10*3/uL (ref 0.0–0.1)
LYMPHS ABS: 1.8 10*3/uL (ref 0.7–3.1)
Lymphs: 39 %
MCH: 29 pg (ref 26.6–33.0)
MCHC: 33.8 g/dL (ref 31.5–35.7)
MCV: 86 fL (ref 79–97)
MONOCYTES: 6 %
MONOS ABS: 0.3 10*3/uL (ref 0.1–0.9)
Neutrophils Absolute: 2.1 10*3/uL (ref 1.4–7.0)
Neutrophils: 44 %
RBC: 4.65 x10E6/uL (ref 4.14–5.80)
RDW: 12.8 % (ref 12.3–15.4)
WBC: 4.7 10*3/uL (ref 3.4–10.8)

## 2017-01-04 LAB — HEMOGLOBIN A1C
ESTIMATED AVERAGE GLUCOSE: 97 mg/dL
HEMOGLOBIN A1C: 5 % (ref 4.8–5.6)

## 2017-01-04 LAB — LIPID PANEL
Chol/HDL Ratio: 1.8 ratio (ref 0.0–5.0)
Cholesterol, Total: 119 mg/dL (ref 100–199)
HDL: 65 mg/dL (ref >39)
LDL Calculated: 45 mg/dL (ref 0–99)
Triglycerides: 43 mg/dL (ref 0–149)
VLDL Cholesterol Cal: 9 mg/dL (ref 5–40)

## 2017-01-04 LAB — VITAMIN D 25 HYDROXY (VIT D DEFICIENCY, FRACTURES): VIT D 25 HYDROXY: 19.9 ng/mL — AB (ref 30.0–100.0)

## 2017-01-04 LAB — TSH: TSH: 0.783 u[IU]/mL (ref 0.450–4.500)

## 2017-01-04 MED ORDER — VITAMIN D (ERGOCALCIFEROL) 1.25 MG (50000 UNIT) PO CAPS: 50000.0000 [IU] | ORAL_CAPSULE | ORAL | 0 refills | Status: DC

## 2017-01-05 ENCOUNTER — Ambulatory Visit: Payer: BC Managed Care – PPO | Admitting: Adult Health

## 2017-02-21 ENCOUNTER — Encounter: Payer: Self-pay | Admitting: Adult Health

## 2017-02-21 ENCOUNTER — Ambulatory Visit (INDEPENDENT_AMBULATORY_CARE_PROVIDER_SITE_OTHER): Payer: BC Managed Care – PPO | Admitting: Adult Health

## 2017-02-21 VITALS — BP 128/76 | HR 73 | Ht 72.5 in | Wt 150.2 lb

## 2017-02-21 DIAGNOSIS — F419 Anxiety disorder, unspecified: Secondary | ICD-10-CM | POA: Diagnosis not present

## 2017-02-21 MED ORDER — SERTRALINE HCL 50 MG PO TABS: ORAL_TABLET | ORAL | 3 refills | Status: DC

## 2017-02-21 MED ORDER — LORAZEPAM 1 MG PO TABS: 0.5000 mg | ORAL_TABLET | Freq: Every day | ORAL | 0 refills | Status: DC

## 2017-02-21 NOTE — Patient Instructions (Signed)
Generalized Anxiety Disorder, Adult Generalized anxiety disorder (GAD) is a mental health disorder. People with this condition constantly worry about everyday events. Unlike normal anxiety, worry related to GAD is not triggered by a specific event. These worries also do not fade or get better with time. GAD interferes with life functions, including relationships, work, and school. GAD can vary from mild to severe. People with severe GAD can have intense waves of anxiety with physical symptoms (panic attacks). What are the causes? The exact cause of GAD is not known. What increases the risk? This condition is more likely to develop in:  Women.  People who have a family history of anxiety disorders.  People who are very shy.  People who experience very stressful life events, such as the death of a loved one.  People who have a very stressful family environment.  What are the signs or symptoms? People with GAD often worry excessively about many things in their lives, such as their health and family. They may also be overly concerned about:  Doing well at work.  Being on time.  Natural disasters.  Friendships.  Physical symptoms of GAD include:  Fatigue.  Muscle tension or having muscle twitches.  Trembling or feeling shaky.  Being easily startled.  Feeling like your heart is pounding or racing.  Feeling out of breath or like you cannot take a deep breath.  Having trouble falling asleep or staying asleep.  Sweating.  Nausea, diarrhea, or irritable bowel syndrome (IBS).  Headaches.  Trouble concentrating or remembering facts.  Restlessness.  Irritability.  How is this diagnosed? Your health care provider can diagnose GAD based on your symptoms and medical history. You will also have a physical exam. The health care provider will ask specific questions about your symptoms, including how severe they are, when they started, and if they come and go. Your health care  provider may ask you about your use of alcohol or drugs, including prescription medicines. Your health care provider may refer you to a mental health specialist for further evaluation. Your health care provider will do a thorough examination and may perform additional tests to rule out other possible causes of your symptoms. To be diagnosed with GAD, a person must have anxiety that:  Is out of his or her control.  Affects several different aspects of his or her life, such as work and relationships.  Causes distress that makes him or her unable to take part in normal activities.  Includes at least three physical symptoms of GAD, such as restlessness, fatigue, trouble concentrating, irritability, muscle tension, or sleep problems.  Before your health care provider can confirm a diagnosis of GAD, these symptoms must be present more days than they are not, and they must last for six months or longer. How is this treated? The following therapies are usually used to treat GAD:  Medicine. Antidepressant medicine is usually prescribed for long-term daily control. Antianxiety medicines may be added in severe cases, especially when panic attacks occur.  Talk therapy (psychotherapy). Certain types of talk therapy can be helpful in treating GAD by providing support, education, and guidance. Options include: ? Cognitive behavioral therapy (CBT). People learn coping skills and techniques to ease their anxiety. They learn to identify unrealistic or negative thoughts and behaviors and to replace them with positive ones. ? Acceptance and commitment therapy (ACT). This treatment teaches people how to be mindful as a way to cope with unwanted thoughts and feelings. ? Biofeedback. This process trains you to   manage your body's response (physiological response) through breathing techniques and relaxation methods. You will work with a therapist while machines are used to monitor your physical symptoms.  Stress  management techniques. These include yoga, meditation, and exercise.  A mental health specialist can help determine which treatment is best for you. Some people see improvement with one type of therapy. However, other people require a combination of therapies. Follow these instructions at home:  Take over-the-counter and prescription medicines only as told by your health care provider.  Try to maintain a normal routine.  Try to anticipate stressful situations and allow extra time to manage them.  Practice any stress management or self-calming techniques as taught by your health care provider.  Do not punish yourself for setbacks or for not making progress.  Try to recognize your accomplishments, even if they are small.  Keep all follow-up visits as told by your health care provider. This is important. Contact a health care provider if:  Your symptoms do not get better.  Your symptoms get worse.  You have signs of depression, such as: ? A persistently sad, cranky, or irritable mood. ? Loss of enjoyment in activities that used to bring you joy. ? Change in weight or eating. ? Changes in sleeping habits. ? Avoiding friends or family members. ? Loss of energy for normal tasks. ? Feelings of guilt or worthlessness. Get help right away if:  You have serious thoughts about hurting yourself or others. If you ever feel like you may hurt yourself or others, or have thoughts about taking your own life, get help right away. You can go to your nearest emergency department or call:  Your local emergency services (911 in the [place]  A suicide crisis helpline, such as the [facility] at (915)409-5998. This is open 24 hours a day.  Summary  Generalized anxiety disorder (GAD) is a mental health disorder that involves worry that is not triggered by a specific event.  People with GAD often worry excessively about many things in their lives, such as their health and  family.  GAD may cause physical symptoms such as restlessness, trouble concentrating, sleep problems, frequent sweating, nausea, diarrhea, headaches, and trembling or muscle twitching.  A mental health specialist can help determine which treatment is best for you. Some people see improvement with one type of therapy. However, other people require a combination of therapies. This information is not intended to replace advice given to you by your health care provider. Make sure you discuss any questions you have with your health care provider. Document Released: 07/03/2012 Document Revised: 01/27/2016 Document Reviewed: 01/27/2016 Elsevier Interactive Patient Education  2018 [employer].  Insomnia Insomnia is a sleep disorder that makes it difficult to fall asleep or to stay asleep. Insomnia can cause tiredness (fatigue), low energy, difficulty concentrating, mood swings, and poor performance at work or school. There are three different ways to classify insomnia:  Difficulty falling asleep.  Difficulty staying asleep.  Waking up too early in the morning.  Any type of insomnia can be long-term (chronic) or short-term (acute). Both are common. Short-term insomnia usually lasts for three months or less. Chronic insomnia occurs at least three times a week for longer than three months. What are the causes? Insomnia may be caused by another condition, situation, or substance, such as:  Anxiety.  Certain medicines.  Gastroesophageal reflux disease (GERD) or other gastrointestinal conditions.  Asthma or other breathing conditions.  Restless legs syndrome, sleep apnea, or other  sleep disorders.  Chronic pain.  Menopause. This may include hot flashes.  Stroke.  Abuse of alcohol, tobacco, or illegal drugs.  Depression.  Caffeine.  Neurological disorders, such as Alzheimer disease.  An overactive thyroid (hyperthyroidism).  The cause of insomnia may not be known. What increases  the risk? Risk factors for insomnia include:  Gender. Women are more commonly affected than men.  Age. Insomnia is more common as you get older.  Stress. This may involve your professional or personal life.  Income. Insomnia is more common in people with lower income.  Lack of exercise.  Irregular work schedule or night shifts.  Traveling between different time zones.  What are the signs or symptoms? If you have insomnia, trouble falling asleep or trouble staying asleep is the main symptom. This may lead to other symptoms, such as:  Feeling fatigued.  Feeling nervous about going to sleep.  Not feeling rested in the morning.  Having trouble concentrating.  Feeling irritable, anxious, or depressed.  How is this treated? Treatment for insomnia depends on the cause. If your insomnia is caused by an underlying condition, treatment will focus on addressing the condition. Treatment may also include:  Medicines to help you sleep.  Counseling or therapy.  Lifestyle adjustments.  Follow these instructions at home:  Take medicines only as directed by your health care provider.  Keep regular sleeping and waking hours. Avoid naps.  Keep a sleep diary to help you and your health care provider figure out what could be causing your insomnia. Include: ? When you sleep. ? When you wake up during the night. ? How well you sleep. ? How rested you feel the next day. ? Any side effects of medicines you are taking. ? What you eat and drink.  Make your bedroom a comfortable place where it is easy to fall asleep: ? Put up shades or special blackout curtains to block light from outside. ? Use a white noise machine to block noise. ? Keep the temperature cool.  Exercise regularly as directed by your health care provider. Avoid exercising right before bedtime.  Use relaxation techniques to manage stress. Ask your health care provider to suggest some techniques that may work well for  you. These may include: ? Breathing exercises. ? Routines to release muscle tension. ? Visualizing peaceful scenes.  Cut back on alcohol, caffeinated beverages, and cigarettes, especially close to bedtime. These can disrupt your sleep.  Do not overeat or eat spicy foods right before bedtime. This can lead to digestive discomfort that can make it hard for you to sleep.  Limit screen use before bedtime. This includes: ? Watching TV. ? Using your smartphone, tablet, and computer.  Stick to a routine. This can help you fall asleep faster. Try to do a quiet activity, brush your teeth, and go to bed at the same time each night.  Get out of bed if you are still awake after 15 minutes of trying to sleep. Keep the lights down, but try reading or doing a quiet activity. When you feel sleepy, go back to bed.  Make sure that you drive carefully. Avoid driving if you feel very sleepy.  Keep all follow-up appointments as directed by your health care provider. This is important. Contact a health care provider if:  You are tired throughout the day or have trouble in your daily routine due to sleepiness.  You continue to have sleep problems or your sleep problems get worse. Get help right away if:  You have serious thoughts about hurting yourself or someone else. This information is not intended to replace advice given to you by your health care provider. Make sure you discuss any questions you have with your health care provider. Document Released: 03/05/2000 Document Revised: 08/08/2015 Document Reviewed: 12/07/2013 Elsevier Interactive Patient Education  2018 [employer]   Please start Sertraline 50mg - 1/2 tablet daily for first week, then increase to full tablet once daily. Use Lorazepam at bedtime, as needed for insomnia. Referral to mental health placed. Practice Sleep Hygiene Increase water intake and eat small, well balanced meals. Please use Nicoderm patches as directed. Follow-up  in 4 weeks, sooner if needed. NICE TO SEE YOU!

## 2017-02-21 NOTE — Progress Notes (Signed)
Subjective:    Patient ID: Isaiah Heath, male    DOB: 10/06/1980, 36 y.o.   MRN: [id number]  HPI :  [name] is here for anxiety, excessive worry, and insomnia.  He has been in therapy for anxiety in 2005/2006, and denies any hx of anti-anxiety/anti-depression meds.  She smokes 1/2 pack a day (he has not started Nicoderm patches) and drinks coffee/water all day- denies ETOH use. He reports worry over finances, new marriage with a blended family ( three children- ages 2721, 2217, 610). He reports early waking 0100-0200 nightly then walking around house, smoking, and "over-thinking". He and his wife saw a couples therapist >4 months ago. He reports poor appetite, however wt is stable today. [facility]. Patient Care Team    Relationship Specialty Notifications Start End  Julaine Fusianford, Corryn Madewell D, NP PCP - General Family Medicine  12/29/16     Patient Active Problem List   Diagnosis Date Noted  . Anxiety 02/21/2017  . Excessive cerumen in ear canal, right 01/03/2017  . Acute pain of right shoulder 12/29/2016  . Hematochezia 11/23/2016  . Healthcare maintenance 11/23/2016  . Nocturia 11/23/2016  . Pain of left middle finger 11/23/2016  . Dizziness 11/23/2016     Past Medical History:  Diagnosis Date  . Anxiety   . Constipation   . Diverticulitis   . Ear infection   . Inguinal hernia, left      Past Surgical History:  Procedure Laterality Date  . HERNIA REPAIR Left [place]2010   [place] [place] [facility], Dr. Nelda Marseilleiblet  . INGUINAL HERNIA REPAIR Left 03/03/2016   Procedure: LEFT INGUNIAL HERNIA EXPLORATION;  Surgeon: Jimmye NormanJames Wyatt, MD;  [facility];  Service: General;  Laterality: Left;  . PROSTATE BIOPSY       Family History  Problem Relation Age of Onset  . Diabetes Mother   . Hypertension Mother   . Stroke Father   . Hypertension Father   . Diabetes Maternal Grandmother   . Hypertension Maternal Grandfather   . Hypertension Paternal Grandfather       Social History   Substance and Sexual Activity  Drug Use No     Social History   Substance and Sexual Activity  Alcohol Use Yes   Comment: rare     Social History   Tobacco Use  Smoking Status Current Some Day Smoker  . Packs/day: 0.25  . Years: 10.00  . Pack years: 2.50  Smokeless Tobacco Never Used     Outpatient Encounter Medications as of 02/21/2017  Medication Sig  . Vitamin D, Ergocalciferol, (DRISDOL) 50000 units CAPS capsule Take 1 capsule (50,000 Units total) by mouth every 7 (seven) days.  Marland Kitchen. LORazepam (ATIVAN) 1 MG tablet Take 0.5 tablets (0.5 mg total) by mouth at bedtime.  . nicotine (NICODERM CQ) 14 mg/24hr patch Place 1 patch (14 mg total) onto the skin daily. (Patient not taking: Reported on 02/21/2017)  . sertraline (ZOLOFT) 50 MG tablet 1/2 tablet daily for week one, then increase to full tablet daily   No facility-administered encounter medications on file as of 02/21/2017.     Allergies: No known allergies  Body mass index is 20.09 kg/m.  Blood pressure 128/76, pulse 73, height 6' 0.5" (1.842 m), weight 150 lb 3.2 oz (68.1 kg).     Review of Systems  Constitutional: Positive for activity change and appetite change. Negative for chills, diaphoresis, fever and unexpected weight change.  Eyes: Negative for visual disturbance.  Respiratory: Negative  for cough, chest tightness, shortness of breath, wheezing and stridor.   Cardiovascular: Negative for chest pain, palpitations and leg swelling.  Gastrointestinal: Negative for abdominal distention, abdominal pain, blood in stool, constipation, diarrhea, nausea and vomiting.  Neurological: Negative for dizziness and headaches.  Psychiatric/Behavioral: Positive for decreased concentration, dysphoric mood and sleep disturbance. Negative for self-injury and suicidal ideas. The patient is nervous/anxious.        Objective:   Physical Exam  Constitutional: He appears well-developed and  well-nourished. No distress.  Skin: He is not diaphoretic.          Assessment & Plan:   1. Anxiety     Anxiety Please start Sertraline 50mg - 1/2 tablet daily for first week, then increase to full tablet once daily. Use Lorazepam at bedtime, as needed for insomnia. [place] Controlled Substance Database reviewed-no contraindications noted. Referral to mental health placed. Practice Sleep Hygiene Increase water intake and eat small, well balanced meals. Please use Nicoderm patches as directed. Follow-up in 4 weeks, sooner if needed.    FOLLOW-UP:  Return in about 4 weeks (around 03/21/2017) for Regular Follow Up, Anxiety, Evaluate Medication Effectiveness.

## 2017-02-21 NOTE — Assessment & Plan Note (Addendum)
Please start Sertraline 50mg - 1/2 tablet daily for first week, then increase to full tablet once daily. Use Lorazepam at bedtime, as needed for insomnia. [place] Controlled Substance Database reviewed-no contraindications noted. Referral to mental health placed. Practice Sleep Hygiene Increase water intake and eat small, well balanced meals. Please use Nicoderm patches as directed. Follow-up in 4 weeks, sooner if needed.

## 2017-02-23 ENCOUNTER — Ambulatory Visit: Payer: BC Managed Care – PPO | Admitting: Gastroenterology

## 2017-03-02 ENCOUNTER — Encounter: Payer: Self-pay | Admitting: Adult Health

## 2017-03-21 ENCOUNTER — Ambulatory Visit: Payer: BC Managed Care – PPO | Admitting: Adult Health

## 2017-03-21 NOTE — Progress Notes (Deleted)
Subjective:    Patient ID: Isaiah Heath, male    DOB: 09/11/1980, 36 y.o.   MRN: [id number]  HPI : 02/21/17 OV: Isaiah Heath is here for anxiety, excessive worry, and insomnia.  He has been in therapy for anxiety in 2005/2006, and denies any hx of anti-anxiety/anti-depression meds.  She smokes 1/2 pack a day (he has not started Nicoderm patches) and drinks coffee/water all day- denies ETOH use. He reports worry over finances, new marriage with a blended family ( three children- ages 4221, 3117, 5010). He reports early waking 0100-0200 nightly then walking around house, smoking, and "over-thinking". He and his wife saw a couples therapist >4 months ago. He reports poor appetite, however wt is stable today. [facility].  03/21/17 OV: Isaiah Heath is here for  Patient Care Team    Relationship Specialty Notifications Start End  Julaine Fusianford, Katy D, NP PCP - General Family Medicine  12/29/16     Patient Active Problem List   Diagnosis Date Noted  . Anxiety 02/21/2017  . Excessive cerumen in ear canal, right 01/03/2017  . Acute pain of right shoulder 12/29/2016  . Hematochezia 11/23/2016  . Healthcare maintenance 11/23/2016  . Nocturia 11/23/2016  . Pain of left middle finger 11/23/2016  . Dizziness 11/23/2016     Past Medical History:  Diagnosis Date  . Anxiety   . Constipation   . Diverticulitis   . Ear infection   . Inguinal hernia, left      Past Surgical History:  Procedure Laterality Date  . HERNIA REPAIR Left [place]2010   [place] [facility] [place], Dr. Nelda Marseilleiblet  . INGUINAL HERNIA REPAIR Left 03/03/2016   Procedure: LEFT INGUNIAL HERNIA EXPLORATION;  Surgeon: Jimmye NormanJames Wyatt, MD;  [facility];  Service: General;  Laterality: Left;  . PROSTATE BIOPSY       Family History  Problem Relation Age of Onset  . Diabetes Mother   . Hypertension Mother   . Stroke Father   . Hypertension Father   . Diabetes Maternal Grandmother   . Hypertension Maternal Grandfather    . Hypertension Paternal Grandfather      Social History   Substance and Sexual Activity  Drug Use No     Social History   Substance and Sexual Activity  Alcohol Use Yes   Comment: rare     Social History   Tobacco Use  Smoking Status Current Some Day Smoker  . Packs/day: 0.25  . Years: 10.00  . Pack years: 2.50  Smokeless Tobacco Never Used     Outpatient Encounter Medications as of 03/21/2017  Medication Sig  . LORazepam (ATIVAN) 1 MG tablet Take 0.5 tablets (0.5 mg total) by mouth at bedtime.  . nicotine (NICODERM CQ) 14 mg/24hr patch Place 1 patch (14 mg total) onto the skin daily. (Patient not taking: Reported on 02/21/2017)  . sertraline (ZOLOFT) 50 MG tablet 1/2 tablet daily for week one, then increase to full tablet daily  . Vitamin D, Ergocalciferol, (DRISDOL) 50000 units CAPS capsule Take 1 capsule (50,000 Units total) by mouth every 7 (seven) days.   No facility-administered encounter medications on file as of 03/21/2017.     Allergies: No known allergies  There is no height or weight on file to calculate BMI.  There were no vitals taken for this visit.     Review of Systems  Constitutional: Positive for activity change and appetite change. Negative for chills, diaphoresis, fever and unexpected weight change.  Eyes: Negative  for visual disturbance.  Respiratory: Negative for cough, chest tightness, shortness of breath, wheezing and stridor.   Cardiovascular: Negative for chest pain, palpitations and leg swelling.  Gastrointestinal: Negative for abdominal distention, abdominal pain, blood in stool, constipation, diarrhea, nausea and vomiting.  Neurological: Negative for dizziness and headaches.  Psychiatric/Behavioral: Positive for decreased concentration, dysphoric mood and sleep disturbance. Negative for self-injury and suicidal ideas. The patient is nervous/anxious.        Objective:   Physical Exam  Constitutional: He appears well-developed  and well-nourished. No distress.  Skin: He is not diaphoretic.          Assessment & Plan:   No diagnosis found.  No problem-specific Assessment & Plan notes found for this encounter.    FOLLOW-UP:  No Follow-up on file.

## 2017-04-07 ENCOUNTER — Encounter: Payer: Self-pay | Admitting: Adult Health

## 2017-04-07 ENCOUNTER — Ambulatory Visit (HOSPITAL_COMMUNITY): Payer: BC Managed Care – PPO

## 2017-04-07 ENCOUNTER — Ambulatory Visit (INDEPENDENT_AMBULATORY_CARE_PROVIDER_SITE_OTHER): Payer: BC Managed Care – PPO | Admitting: Adult Health

## 2017-04-07 VITALS — BP 111/68 | HR 60 | Temp 98.6°F | Ht 72.5 in | Wt 150.2 lb

## 2017-04-07 DIAGNOSIS — R1032 Left lower quadrant pain: Secondary | ICD-10-CM

## 2017-04-07 DIAGNOSIS — K5900 Constipation, unspecified: Secondary | ICD-10-CM

## 2017-04-07 MED ORDER — POLYETHYLENE GLYCOL 3350 17 G PO PACK: 17.0000 g | PACK | Freq: Every day | ORAL | 0 refills | Status: DC

## 2017-04-07 MED ORDER — ONDANSETRON 8 MG PO TBDP: 8.0000 mg | ORAL_TABLET | Freq: Three times a day (TID) | ORAL | 0 refills | Status: DC | PRN

## 2017-04-07 NOTE — Assessment & Plan Note (Addendum)
Please take Miralax, Zofran as directed. Increase fluids OTC Acetaminophen per manufacturer for pain control. STAT Ultrasound ordered- scheduled for tomorrow Urgent referral to GI placed- has appt tomorrow CBC drawn today We will call you when lab/US results are available. Please call clinic with any questions/concerns.

## 2017-04-07 NOTE — Progress Notes (Signed)
Subjective:    Patient ID: Isaiah Heath, male    DOB: 06/10/1980, 37 y.o.   MRN: [id number]  HPI: MR. Loomis presents with LLQ pain that is constant, described as "bulging/throbbing, and taking up space", rated 9/10. Pain will radiate from LLQ to L lower back. He has been constipated for four days, had one BM yesterday that was "large, formed" and did not provide relief.  He had one small BM this morning, several hard form strips of stool about 2 inches in length. He had one episode of hematochezia last Thursday and he estimates "about a teaspoon" of blood. He has had mild nausea and "spit up some mucus this morning". He reports only smoking 5-6 cigarettes/day now. He denies hx of blood in urine. He has long standing hx of constipation, diverticlitis, hematochezia, and abdominal pain. He also has hx of intestinal obstruction. He was referred to GI in Sept 2018, however he never went to appt. He has been taking Miralex intermittently since the weekend. He denies fever/night sweats.  Patient Care Team    Relationship Specialty Notifications Start End  Julaine Fusianford, Charlita Brian D, NP PCP - General Family Medicine  12/29/16     Patient Active Problem List   Diagnosis Date Noted  . Constipation 04/07/2017  . LLQ pain 04/07/2017  . Anxiety 02/21/2017  . Excessive cerumen in ear canal, right 01/03/2017  . Acute pain of right shoulder 12/29/2016  . Hematochezia 11/23/2016  . Healthcare maintenance 11/23/2016  . Nocturia 11/23/2016  . Pain of left middle finger 11/23/2016  . Dizziness 11/23/2016     Past Medical History:  Diagnosis Date  . Anxiety   . Constipation   . Diverticulitis   . Ear infection   . Inguinal hernia, left   . Intestinal obstruction [facility](HCC)      Past Surgical History:  Procedure Laterality Date  . HERNIA REPAIR Left [place]2010   [place], Dr. Nelda Marseilleiblet  . INGUINAL HERNIA REPAIR Left 03/03/2016   Procedure: LEFT INGUNIAL HERNIA EXPLORATION;   Surgeon: Jimmye NormanJames Wyatt, MD;  [facility];  Service: General;  Laterality: Left;  . PROSTATE BIOPSY       Family History  Problem Relation Age of Onset  . Diabetes Mother   . Hypertension Mother   . Stroke Father   . Hypertension Father   . Diabetes Maternal Grandmother   . Hypertension Maternal Grandfather   . Hypertension Paternal Grandfather      Social History   Substance and Sexual Activity  Drug Use No     Social History   Substance and Sexual Activity  Alcohol Use Yes   Comment: rare     Social History   Tobacco Use  Smoking Status Current Some Day Smoker  . Packs/day: 0.25  . Years: 10.00  . Pack years: 2.50  Smokeless Tobacco Never Used     Outpatient Encounter Medications as of 04/07/2017  Medication Sig  . sertraline (ZOLOFT) 50 MG tablet 1/2 tablet daily for week one, then increase to full tablet daily  . Vitamin D, Ergocalciferol, (DRISDOL) 50000 units CAPS capsule Take 1 capsule (50,000 Units total) by mouth every 7 (seven) days.  . ondansetron (ZOFRAN-ODT) 8 MG disintegrating tablet Take 1 tablet (8 mg total) by mouth every 8 (eight) hours as needed for nausea or vomiting.  . polyethylene glycol (MIRALAX / GLYCOLAX) packet Take 17 g by mouth daily.  . [DISCONTINUED] LORazepam (ATIVAN) 1 MG tablet Take 0.5 tablets (0.5 mg total) by  mouth at bedtime.  . [DISCONTINUED] nicotine (NICODERM CQ) 14 mg/24hr patch Place 1 patch (14 mg total) onto the skin daily. (Patient not taking: Reported on 02/21/2017)   No facility-administered encounter medications on file as of 04/07/2017.     Allergies: No known allergies  Body mass index is 20.09 kg/m.  Blood pressure 111/68, pulse 60, temperature 98.6 F (37 C), temperature source Oral, height 6' 0.5" (1.842 m), weight 150 lb 3.2 oz (68.1 kg), SpO2 97 %.      Review of Systems  Constitutional: Positive for activity change, appetite change and fatigue. Negative for chills, diaphoresis, fever and  unexpected weight change.  Eyes: Negative for visual disturbance.  Respiratory: Negative for cough, chest tightness, shortness of breath, wheezing and stridor.   Cardiovascular: Negative for chest pain, palpitations and leg swelling.  Gastrointestinal: Positive for abdominal pain, constipation and nausea. Negative for abdominal distention, blood in stool, diarrhea, rectal pain and vomiting.  Endocrine: Negative for cold intolerance, heat intolerance, polydipsia, polyphagia and polyuria.  Genitourinary: Negative for difficulty urinating and flank pain.  Psychiatric/Behavioral: Positive for sleep disturbance.       Objective:   Physical Exam  Constitutional: He is oriented to person, place, and time. He appears well-developed and well-nourished. He appears distressed.  Cardiovascular: Normal rate, regular rhythm, normal heart sounds and intact distal pulses.  No murmur heard. Pulmonary/Chest: Effort normal and breath sounds normal. No respiratory distress. He has no wheezes. He has no rales. He exhibits no tenderness.  Abdominal: Soft. Normal appearance. He exhibits no distension, no fluid wave, no abdominal bruit, no ascites and no mass. Bowel sounds are decreased. There is no hepatosplenomegaly. There is tenderness in the left upper quadrant and left lower quadrant. There is no rigidity, no rebound, no guarding, no CVA tenderness, no tenderness at McBurney's point and negative Murphy's sign.  Normal BS on RUQ/RLQ Hypoactive BS on LUQ/LLQ  Neurological: He is alert and oriented to person, place, and time.  Skin: He is not diaphoretic.  Psychiatric: His affect is blunt.  He has very blunt, forlorn expression-however he has appeared this way at every previous OV          Assessment & Plan:   1. LLQ pain   2. Constipation, unspecified constipation type     LLQ pain Please take Miralax, Zofran as directed. Increase fluids OTC Acetaminophen per manufacturer for pain control. STAT  Ultrasound ordered- scheduled for tomorrow Urgent referral to GI placed- has appt tomorrow CBC drawn today We will call you when lab/US results are available. Please call clinic with any questions/concerns.    FOLLOW-UP:  Return if symptoms worsen or fail to improve.

## 2017-04-07 NOTE — Patient Instructions (Signed)
Constipation, Adult Constipation is when a person has fewer bowel movements in a week than normal, has difficulty having a bowel movement, or has stools that are dry, hard, or larger than normal. Constipation may be caused by an underlying condition. It may become worse with age if a person takes certain medicines and does not take in enough fluids. Follow these instructions at home: Eating and drinking   Eat foods that have a lot of fiber, such as fresh fruits and vegetables, whole grains, and beans.  Limit foods that are high in fat, low in fiber, or overly processed, such as french fries, hamburgers, cookies, candies, and soda.  Drink enough fluid to keep your urine clear or pale yellow. General instructions  Exercise regularly or as told by your health care provider.  Go to the restroom when you have the urge to go. Do not hold it in.  Take over-the-counter and prescription medicines only as told by your health care provider. These include any fiber supplements.  Practice pelvic floor retraining exercises, such as deep breathing while relaxing the lower abdomen and pelvic floor relaxation during bowel movements.  Watch your condition for any changes.  Keep all follow-up visits as told by your health care provider. This is important. Contact a health care provider if:  You have pain that gets worse.  You have a fever.  You do not have a bowel movement after 4 days.  You vomit.  You are not hungry.  You lose weight.  You are bleeding from the anus.  You have thin, pencil-like stools. Get help right away if:  You have a fever and your symptoms suddenly get worse.  You leak stool or have blood in your stool.  Your abdomen is bloated.  You have severe pain in your abdomen.  You feel dizzy or you faint. This information is not intended to replace advice given to you by your health care provider. Make sure you discuss any questions you have with your health care  provider. Document Released: 12/05/2003 Document Revised: 09/26/2015 Document Reviewed: 08/27/2015 Elsevier Interactive Patient Education  2018 [employer]  Please take Miralax, Zofran as directed. Increase fluids OTC Acetaminophen per manufacturer for pain control. STAT Ultrasound order and urgent referral to GI. We will call you when lab/US results are available. Please call clinic with any questions/concerns. FEEL BETTER!

## 2017-04-08 ENCOUNTER — Ambulatory Visit (HOSPITAL_COMMUNITY)
Admission: RE | Admit: 2017-04-08 | Discharge: 2017-04-08 | Disposition: A | Discharge status: Home / Self Care | Payer: BC Managed Care – PPO | Source: Ambulatory Visit | Attending: Adult Health | Admitting: Adult Health

## 2017-04-08 ENCOUNTER — Other Ambulatory Visit: Payer: BC Managed Care – PPO

## 2017-04-08 ENCOUNTER — Encounter: Payer: Self-pay | Admitting: Gastroenterology

## 2017-04-08 ENCOUNTER — Other Ambulatory Visit (INDEPENDENT_AMBULATORY_CARE_PROVIDER_SITE_OTHER): Payer: BC Managed Care – PPO

## 2017-04-08 ENCOUNTER — Ambulatory Visit (INDEPENDENT_AMBULATORY_CARE_PROVIDER_SITE_OTHER): Payer: BC Managed Care – PPO | Admitting: Gastroenterology

## 2017-04-08 VITALS — BP 104/70 | HR 62 | Ht 74.0 in | Wt 148.1 lb

## 2017-04-08 DIAGNOSIS — K921 Melena: Secondary | ICD-10-CM

## 2017-04-08 DIAGNOSIS — R1032 Left lower quadrant pain: Secondary | ICD-10-CM | POA: Insufficient documentation

## 2017-04-08 DIAGNOSIS — K5900 Constipation, unspecified: Secondary | ICD-10-CM | POA: Insufficient documentation

## 2017-04-08 LAB — COMPREHENSIVE METABOLIC PANEL
ALT: 13 U/L (ref 0–53)
AST: 18 U/L (ref 0–37)
Albumin: 4.3 g/dL (ref 3.5–5.2)
Alkaline Phosphatase: 58 U/L (ref 39–117)
BUN: 7 mg/dL (ref 6–23)
CHLORIDE: 107 meq/L (ref 96–112)
CO2: 31 meq/L (ref 19–32)
Calcium: 9.5 mg/dL (ref 8.4–10.5)
Creatinine, Ser: 0.89 mg/dL (ref 0.40–1.50)
GFR: 122.58 mL/min (ref >60.00)
Glucose, Bld: 111 mg/dL — ABNORMAL HIGH (ref 70–99)
POTASSIUM: 4.5 meq/L (ref 3.5–5.1)
Sodium: 141 mEq/L (ref 135–145)
Total Bilirubin: 0.5 mg/dL (ref 0.2–1.2)
Total Protein: 6.9 g/dL (ref 6.0–8.3)

## 2017-04-08 LAB — SPECIMEN STATUS REPORT

## 2017-04-08 NOTE — Progress Notes (Signed)
HPI: This is a very pleasant 37 year old man   who was referred to me by William Hamburgeranford, Katy D, NP  to evaluate abdominal pain, blood in stool.    Chief complaint is abdominal pain, blood in stool\  He tells me that 4 years ago or so he had left-sided diverticulitis and was treated with antibiotics.  About a year and a half ago he underwent left sided inguinal hernia repair for some left sided abdominal pains as well as a inguinal hernia on that side.  He was told he had a lot of scar tissue by the surgeon.  For the past week or so he has had left-sided abdominal pains, change in his bowels and some blood in his stool.  The blood in his stool does occur about once a month so that is not particularly new.  Left-sided abdominal pain is a twisting sensation, fullness or stretching sensation in the left side.  It is been fairly constant for the past several days.  He has had no fevers but he has had some slight chills he believes.  No nausea or vomiting.  He does think he has lost about 5 or 6 pounds over the past 6 months.  He went to his PCP, CBC and ultrasound were ordered.  See those results below.  They were both done yesterday.    Old Data Reviewed: CBC yesterday was normal  Abd US yesterday was "within normal limits"  CT scan abdomen and pelvis with IV and oral contrast May 2018 done for "seven (sic) abdominal pain.  Constipation, abdominal surgery December 2017" was essentially normal except for some slight pelvic fluid and "mild colonic diverticulosis"  CT 07/2014 "large stool ball in rectum," otherwise normal, no diveriticulitis.    Review of systems: Pertinent positive and negative review of systems were noted in the above HPI section. All other review negative.   Past Medical History:  Diagnosis Date  . Anxiety   . Constipation   . Diverticulitis   . Ear infection   . Inguinal hernia, left   . Intestinal obstruction [facility](HCC)     Past Surgical History:  Procedure Laterality Date   . HERNIA REPAIR Left [place]2010   [place] [facility], Dr. Nelda Marseilleiblet  . INGUINAL HERNIA REPAIR Left 03/03/2016   Procedure: LEFT INGUNIAL HERNIA EXPLORATION;  Surgeon: Jimmye NormanJames Wyatt, MD;  [facility];  Service: General;  Laterality: Left;  . PROSTATE BIOPSY      Current Outpatient Medications  Medication Sig Dispense Refill  . ondansetron (ZOFRAN-ODT) 8 MG disintegrating tablet Take 1 tablet (8 mg total) by mouth every 8 (eight) hours as needed for nausea or vomiting. 30 tablet 0  . polyethylene glycol (MIRALAX / GLYCOLAX) packet Take 17 g by mouth daily. 14 each 0  . sertraline (ZOLOFT) 50 MG tablet 1/2 tablet daily for week one, then increase to full tablet daily 30 tablet 3  . Vitamin D, Ergocalciferol, (DRISDOL) 50000 units CAPS capsule Take 1 capsule (50,000 Units total) by mouth every 7 (seven) days. 16 capsule 0   No current facility-administered medications for this visit.     Allergies as of 04/08/2017 - Review Complete 04/08/2017  Allergen Reaction Noted  . No known allergies  03/02/2016    Family History  Problem Relation Age of Onset  . Diabetes Mother   . Hypertension Mother   . Stroke Father   . Hypertension Father   . Diabetes Maternal Grandmother   . Hypertension Maternal Grandfather   . Hypertension  Paternal Grandfather     Social History   Socioeconomic History  . Marital status: Married    Spouse name: Not on file  . Number of children: Not on file  . Years of education: Not on file  . Highest education level: Not on file  Social Needs  . Financial resource strain: Not on file  . Food insecurity - worry: Not on file  . Food insecurity - inability: Not on file  . Transportation needs - medical: Not on file  . Transportation needs - non-medical: Not on file  Occupational History  . Not on file  Tobacco Use  . Smoking status: Current Some Day Smoker    Packs/day: 0.25    Years: 10.00    Pack years: 2.50  . Smokeless tobacco: Never Used   Substance and Sexual Activity  . Alcohol use: Yes    Comment: rare  . Drug use: No  . Sexual activity: Yes    Birth control/protection: IUD  Other Topics Concern  . Not on file  Social History Narrative  . Not on file     Physical Exam: BP 104/70   Pulse 62   Ht 6\' 2"  (1.88 m)   Wt 148 lb 2 oz (67.2 kg)   BMI 19.02 kg/m  Constitutional: generally well-appearing Psychiatric: alert and oriented x3 Eyes: extraocular movements intact Mouth: oral pharynx moist, no lesions Neck: supple no lymphadenopathy Cardiovascular: heart regular rate and rhythm Lungs: clear to auscultation bilaterally Abdomen: soft, very mild left lower quadrant pain, nondistended, no obvious ascites, no peritoneal signs, normal bowel sounds Extremities: no lower extremity edema bilaterally Skin: no lesions on visible extremities   Assessment and plan: 37 y.o. male with left lower quadrant, left pelvic plane, intermittent blood in stool, chronic constipation  CBC was normal history and his ultrasound was normal.  He is actually not been found or proven to have acute diverticulitis based on previous CT scans.  He does have diverticulosis however.  I am not sure if he is having diverticulitis now but I am ordering a CT scan abdomen and pelvis to check for that.  He will also get further blood testing occluding his complete metabolic profile today.  If the above tests are helpful for determining what is going on that he will need a colonoscopy.    Please see the "Patient Instructions" section for addition details about the plan.   Rob Bunting, MD [facility] 04/08/2017, 8:34 AM  Cc: Julaine Fusi, NP

## 2017-04-08 NOTE — Patient Instructions (Addendum)
You will be set up for a CT scan of abdomen and pelvis with IV and oral contrast for LLQ pains.  You have been scheduled for a CT scan of the abdomen and pelvis at [facility] ([address]---this is in the same building as Press photographer).   You are scheduled on 04/19/17 at 930 am. You should arrive 15 minutes prior to your appointment time for registration. Please follow the written instructions below on the day of your exam:  WARNING: IF YOU ARE ALLERGIC TO IODINE/X-RAY DYE, PLEASE NOTIFY RADIOLOGY IMMEDIATELY AT [phone]! YOU WILL BE GIVEN A 13 HOUR PREMEDICATION PREP.  1) Do not eat or drink anything after 530 am (4 hours prior to your test) 2) You have been given 2 bottles of oral contrast to drink. The solution may taste better if refrigerated, but do NOT add ice or any other liquid to this solution. Shake well before drinking.    Drink 1 bottle of contrast @ 730 am (2 hours prior to your exam)  Drink 1 bottle of contrast @ 830 am (1 hour prior to your exam)  You may take any medications as prescribed with a small amount of water except for the following: Metformin, Glucophage, Glucovance, Avandamet, Riomet, Fortamet, Actoplus Met, Janumet, Glumetza or Metaglip. The above medications must be held the day of the exam AND 48 hours after the exam.  The purpose of you drinking the oral contrast is to aid in the visualization of your intestinal tract. The contrast solution may cause some diarrhea. Before your exam is started, you will be given a small amount of fluid to drink. Depending on your individual set of symptoms, you may also receive an intravenous injection of x-ray contrast/dye. Plan on being at [facility] for 30 minutes or longer, depending on the type of exam you are having performed.  This test typically takes 30-45 minutes to complete.  If you have any questions regarding your exam or if you need to reschedule, you may call the CT department at  [phone] between the hours of 8:00 am and 5:00 pm, Monday-Friday.  ________________________________________________________________   Dennis Bast will have labs checked today in the basement lab.  Please head down after you check out with the front desk  (cmet)  Pending these results you may need colonoscopy for the pains, bleeding.  Normal BMI (Body Mass Index- based on height and weight) is between 19 and 25. Your BMI today is Body mass index is 19.02 kg/m. Marland Kitchen Please consider follow up  regarding your BMI with your Primary Care Provider.

## 2017-04-11 LAB — CBC WITH DIFFERENTIAL/PLATELET
BASOS ABS: 0 10*3/uL (ref 0.0–0.2)
Basos: 0 %
EOS (ABSOLUTE): 0.3 10*3/uL (ref 0.0–0.4)
Eos: 6 %
Hematocrit: 39.4 % (ref 37.5–51.0)
Hemoglobin: 13.3 g/dL (ref 13.0–17.7)
IMMATURE GRANS (ABS): 0 10*3/uL (ref 0.0–0.1)
IMMATURE GRANULOCYTES: 0 %
LYMPHS: 33 %
Lymphocytes Absolute: 1.6 10*3/uL (ref 0.7–3.1)
MCH: 29.9 pg (ref 26.6–33.0)
MCHC: 33.8 g/dL (ref 31.5–35.7)
MCV: 89 fL (ref 79–97)
MONOS ABS: 0.2 10*3/uL (ref 0.1–0.9)
Monocytes: 4 %
NEUTROS ABS: 2.9 10*3/uL (ref 1.4–7.0)
NEUTROS PCT: 57 %
PLATELETS: 205 10*3/uL (ref 150–379)
RBC: 4.45 x10E6/uL (ref 4.14–5.80)
RDW: 12.4 % (ref 12.3–15.4)
WBC: 5 10*3/uL (ref 3.4–10.8)

## 2017-04-19 ENCOUNTER — Ambulatory Visit (INDEPENDENT_AMBULATORY_CARE_PROVIDER_SITE_OTHER)
Admission: RE | Admit: 2017-04-19 | Discharge: 2017-04-19 | Disposition: A | Discharge status: Home / Self Care | Payer: BC Managed Care – PPO | Source: Ambulatory Visit | Attending: Gastroenterology | Admitting: Gastroenterology

## 2017-04-19 DIAGNOSIS — R1032 Left lower quadrant pain: Secondary | ICD-10-CM | POA: Diagnosis not present

## 2017-04-19 DIAGNOSIS — K921 Melena: Secondary | ICD-10-CM | POA: Diagnosis not present

## 2017-04-19 MED ORDER — IOPAMIDOL (ISOVUE-300) INJECTION 61%
100.0000 mL | Freq: Once | INTRAVENOUS | Status: AC | PRN
  Administered 2017-04-19: 100 mL via INTRAVENOUS

## 2017-05-06 ENCOUNTER — Encounter: Payer: Self-pay | Admitting: Gastroenterology

## 2017-05-06 ENCOUNTER — Ambulatory Visit (AMBULATORY_SURGERY_CENTER): Payer: Self-pay

## 2017-05-06 VITALS — Ht 73.0 in | Wt 144.6 lb

## 2017-05-06 DIAGNOSIS — R1032 Left lower quadrant pain: Secondary | ICD-10-CM

## 2017-05-06 DIAGNOSIS — K921 Melena: Secondary | ICD-10-CM

## 2017-05-06 MED ORDER — PEG 3350-KCL-NA BICARB-NACL 420 G PO SOLR: 4000.0000 mL | Freq: Once | ORAL | 0 refills | Status: AC

## 2017-05-06 NOTE — Progress Notes (Signed)
Per pt, no allergies to soy or egg products.Pt not taking any weight loss meds or using  O2 at home.  Pt refused emmi video. 

## 2017-05-13 ENCOUNTER — Ambulatory Visit (AMBULATORY_SURGERY_CENTER): Payer: BC Managed Care – PPO | Admitting: Gastroenterology

## 2017-05-13 ENCOUNTER — Encounter: Payer: Self-pay | Admitting: Gastroenterology

## 2017-05-13 ENCOUNTER — Other Ambulatory Visit: Payer: Self-pay

## 2017-05-13 VITALS — BP 127/76 | HR 57 | Temp 97.8°F | Resp 10 | Ht 73.0 in | Wt 144.0 lb

## 2017-05-13 DIAGNOSIS — K5730 Diverticulosis of large intestine without perforation or abscess without bleeding: Secondary | ICD-10-CM | POA: Diagnosis not present

## 2017-05-13 DIAGNOSIS — K921 Melena: Secondary | ICD-10-CM

## 2017-05-13 DIAGNOSIS — R1032 Left lower quadrant pain: Secondary | ICD-10-CM | POA: Diagnosis not present

## 2017-05-13 MED ORDER — SODIUM CHLORIDE 0.9 % IV SOLN: 500.0000 mL | Freq: Once | INTRAVENOUS | Status: DC

## 2017-05-13 NOTE — Op Note (Signed)
[facility] Patient Name: Isaiah Heath Procedure Date: 05/13/2017 3:16 PM MRN: [id number] Endoscopist: Rachael Fee , MD Age: 37 Referring MD:  Date of Birth: 1980/05/30 Gender: Male Account #: [id number] Procedure:                Colonoscopy Indications:              Abdominal pain in the left lower quadrant, Rectal                            bleeding Medicines:                Monitored Anesthesia Care Procedure:                Pre-Anesthesia Assessment:                           - Prior to the procedure, a History and Physical                            was performed, and patient medications and                            allergies were reviewed. The patient's tolerance of                            previous anesthesia was also reviewed. The risks                            and benefits of the procedure and the sedation                            options and risks were discussed with the patient.                            All questions were answered, and informed consent                            was obtained. Prior Anticoagulants: The patient has                            taken no previous anticoagulant or antiplatelet                            agents. ASA Grade Assessment: II - A patient with                            mild systemic disease. After reviewing the risks                            and benefits, the patient was deemed in                            satisfactory condition to undergo the procedure.  After obtaining informed consent, the colonoscope                            was passed under direct vision. Throughout the                            procedure, the patient's blood pressure, pulse, and                            oxygen saturations were monitored continuously. The                            Colonoscope was introduced through the anus and                            advanced to the the terminal ileum. The colonoscopy                             was performed without difficulty. The patient                            tolerated the procedure well. The quality of the                            bowel preparation was good. The terminal ileum,                            ileocecal valve, appendiceal orifice, and rectum                            were photographed. Scope In: 3:43:26 PM Scope Out: 3:57:16 PM Scope Withdrawal Time: 0 hours 8 minutes 12 seconds  Total Procedure Duration: 0 hours 13 minutes 50 seconds  Findings:                 The terminal ileum appeared normal.                           A few small and large-mouthed diverticula were                            found in the left colon.                           The exam was otherwise without abnormality on                            direct and retroflexion views. Complications:            No immediate complications. Estimated blood loss:                            None. Estimated Blood Loss:     Estimated blood loss: none. Impression:               - The examined portion of the ileum was  normal.                           - Diverticulosis in the left colon.                           - The examination was otherwise normal on direct                            and retroflexion views.                           - No polyps or cancers Recommendation:           - Patient has a contact number available for                            emergencies. The signs and symptoms of potential                            delayed complications were discussed with the                            patient. Return to normal activities tomorrow.                            Written discharge instructions were provided to the                            patient.                           - Resume previous diet.                           - Continue present medications.                           - Repeat colonoscopy at age 43 for screening                            purposes.                            - EGD to workup your unintentional weight loss. Rachael Fee, MD 05/13/2017 4:01:42 PM This report has been signed electronically.

## 2017-05-13 NOTE — Progress Notes (Signed)
Report to PACU, RN, vss, BBS= Clear.  

## 2017-05-13 NOTE — Patient Instructions (Signed)
YOU HAD AN ENDOSCOPIC PROCEDURE TODAY AT THE [facility]:   Refer to the procedure report that was given to you for any specific questions about what was found during the examination.  If the procedure report does not answer your questions, please call your gastroenterologist to clarify.  If you requested that your care partner not be given the details of your procedure findings, then the procedure report has been included in a sealed envelope for you to review at your convenience later.  YOU SHOULD EXPECT: Some feelings of bloating in the abdomen. Passage of more gas than usual.  Walking can help get rid of the air that was put into your GI tract during the procedure and reduce the bloating. If you had a lower endoscopy (such as a colonoscopy or flexible sigmoidoscopy) you may notice spotting of blood in your stool or on the toilet paper. If you underwent a bowel prep for your procedure, you may not have a normal bowel movement for a few days.  Please Note:  You might notice some irritation and congestion in your nose or some drainage.  This is from the oxygen used during your procedure.  There is no need for concern and it should clear up in a day or so.  SYMPTOMS TO REPORT IMMEDIATELY:   Following lower endoscopy (colonoscopy or flexible sigmoidoscopy):  Excessive amounts of blood in the stool  Significant tenderness or worsening of abdominal pains  Swelling of the abdomen that is new, acute  Fever of 100F or higher   For urgent or emergent issues, a gastroenterologist can be reached at any hour by calling [phone].   DIET:  We do recommend a small meal at first, but then you may proceed to your regular diet.  Drink plenty of fluids but you should avoid alcoholic beverages for 24 hours.  ACTIVITY:  You should plan to take it easy for the rest of today and you should NOT DRIVE or use heavy machinery until tomorrow (because of the sedation medicines used during the test).     FOLLOW UP: Our staff will call the number listed on your records the next business day following your procedure to check on you and address any questions or concerns that you may have regarding the information given to you following your procedure. If we do not reach you, we will leave a message.  However, if you are feeling well and you are not experiencing any problems, there is no need to return our call.  We will assume that you have returned to your regular daily activities without incident.  If any biopsies were taken you will be contacted by phone or by letter within the next 1-3 weeks.  Please call us at [phone] if you have not heard about the biopsies in 3 weeks.    SIGNATURES/CONFIDENTIALITY: You and/or your care partner have signed paperwork which will be entered into your electronic medical record.  These signatures attest to the fact that that the information above on your After Visit Summary has been reviewed and is understood.  Full responsibility of the confidentiality of this discharge information lies with you and/or your care-partner.   Thank you for allowing us to provide your healthcare today.  

## 2017-05-16 ENCOUNTER — Telehealth: Payer: Self-pay

## 2017-05-16 NOTE — Telephone Encounter (Signed)
  Follow up Call-  Call back number 05/13/2017  Post procedure Call Back phone  # [phone]  Permission to leave phone message Yes  Some recent data might be hidden     Left message

## 2017-05-16 NOTE — Telephone Encounter (Signed)
  Follow up Call-  Call back number 05/13/2017  Post procedure Call Back phone  # [phone]  Permission to leave phone message Yes  Some recent data might be hidden     Patient questions:  Do you have a fever, pain , or abdominal swelling? No. Pain Score  0 *  Have you tolerated food without any problems? Yes.    Have you been able to return to your normal activities? Yes.    Do you have any questions about your discharge instructions: Diet   No. Medications  No. Follow up visit  No.  Do you have questions or concerns about your Care? No.  Actions: * If pain score is 4 or above: No action needed, pain <4.  No problems noted per pt. maw

## 2017-05-17 ENCOUNTER — Encounter: Payer: BC Managed Care – PPO | Admitting: Gastroenterology

## 2017-07-01 ENCOUNTER — Encounter: Payer: BC Managed Care – PPO | Admitting: Gastroenterology

## 2017-07-01 ENCOUNTER — Telehealth: Payer: Self-pay | Admitting: Gastroenterology

## 2017-07-04 NOTE — Telephone Encounter (Signed)
He should be charged per the 'no show' policy.  thanks

## 2017-07-13 ENCOUNTER — Ambulatory Visit: Payer: BC Managed Care – PPO

## 2017-07-13 ENCOUNTER — Ambulatory Visit (INDEPENDENT_AMBULATORY_CARE_PROVIDER_SITE_OTHER): Payer: BC Managed Care – PPO | Admitting: Adult Health

## 2017-07-13 ENCOUNTER — Encounter: Payer: Self-pay | Admitting: Adult Health

## 2017-07-13 VITALS — BP 116/78 | HR 65 | Temp 98.7°F | Ht 74.0 in | Wt 141.0 lb

## 2017-07-13 DIAGNOSIS — R600 Localized edema: Secondary | ICD-10-CM

## 2017-07-13 DIAGNOSIS — G5762 Lesion of plantar nerve, left lower limb: Secondary | ICD-10-CM | POA: Insufficient documentation

## 2017-07-13 DIAGNOSIS — M79672 Pain in left foot: Secondary | ICD-10-CM

## 2017-07-13 DIAGNOSIS — M7742 Metatarsalgia, left foot: Secondary | ICD-10-CM

## 2017-07-13 MED ORDER — HYDROCODONE-IBUPROFEN 5-200 MG PO TABS: 1.0000 | ORAL_TABLET | Freq: Three times a day (TID) | ORAL | 0 refills | Status: AC | PRN

## 2017-07-13 NOTE — Assessment & Plan Note (Addendum)
[place] Controlled Substance Database reviewed- no aberrancies noted. Please use Vicoprofen for sever pain and OTC Ibuprofen 800mg  for moderate pain- Ibuprofen every 8 hrs with food as needed. Please follow care instructions as listed above. Referral to Podiatrist placed. Wear supportive foot wear and rest as often as possible. Please call clinic with any questions/concerns.

## 2017-07-13 NOTE — Patient Instructions (Addendum)
Morton Neuralgia Morton neuralgia is a type of foot pain in the area closest to your toes. This area is sometimes called the ball of your foot. Morton neuralgia occurs when a branch of a nerve in your foot (digital nerve) becomes compressed. When this happens over a long period of time, the nerve can thicken (neuroma) and cause pain. This usually occurs between the third and fourth toe. Morton neuralgia can come and go but may get worse over time. What are the causes? Your digital nerve can become compressed and stretched at a point where it passes under a thick band of tissue that connects your toes (intermetatarsal ligament). Morton neuralgia can be caused by mild repetitive damage in this area. This type of damage can result from:  Activities such as running or jumping.  Wearing shoes that are too tight.  What increases the risk? You may be at risk for Morton neuralgia if you:  Are male.  Wear high heels.  Wear shoes that are narrow or tight.  Participate in activities that stretch your toes. These include: ? Running. ? Ballet. ? Long-distance walking.  What are the signs or symptoms? The first symptom of Morton neuralgia is pain that spreads from the ball of your foot to your toes. It may feel like you are walking on a marble. Pain usually gets worse with walking and goes away at night. Other symptoms may include numbness and cramping of your toes. How is this diagnosed? Your health care provider will do a physical exam. When doing the exam, your health care provider may:  Squeeze your foot just behind your toe.  Ask you to move your toes to check for pain.  You may also have tests on your foot to confirm the diagnosis. These may include:  An X-ray.  An MRI.  How is this treated? Treatment for Morton neuralgia may be as simple as changing the kind of shoes you wear. Other treatments may include:  Wearing a supportive pad (orthosis) under the front of your foot. This  lifts your toe bones and takes pressure off the nerve.  Getting injections of numbing medicine and anti-inflammatory medicine (steroid) in the nerve.  Having surgery to remove part of the thickened nerve.  Follow these instructions at home:  Take medicine only as directed by your health care provider.  Wear soft-soled shoes with a wide toe area.  Stop activities that may be causing pain.  Elevate your foot when resting.  Massage your foot.  Apply ice to the injured area: ? Put ice in a plastic bag. ? Place a towel between your skin and the bag. ? Leave the ice on for 20 minutes, 2-3 times a day.  Keep all follow-up visits as directed by your health care provider. This is important. Contact a health care provider if:  Home care instructions are not helping you get better.  Your symptoms change or get worse. This information is not intended to replace advice given to you by your health care provider. Make sure you discuss any questions you have with your health care provider. Document Released: 06/14/2000 Document Revised: 08/14/2015 Document Reviewed: 05/09/2013 Elsevier Interactive Patient Education  2018 [employer]  Please use Vicoprofen for sever pain and OTC Ibuprofen 800mg  for moderate pain- Ibuprofen every 8 hrs with food as needed. Please follow care instructions as listed above. Referral to Podiatrist placed. Wear supportive foot wear and rest as often as possible. Please call clinic with any questions/concerns. FEEL BETTER!

## 2017-07-13 NOTE — Progress Notes (Signed)
Subjective:    Patient ID: Isaiah Heath, male    DOB: 09/29/1980, 37 y.o.   MRN: [id number]  HPI:  Isaiah Heath presents with L foot pain that spontaneously began >1 month ago.  He denies acute trauma/injury prior to onset of pain.  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  He reports wearing "running shoes" when working. He reports sig increase in pain and nighttime swelling the last 6/7 days.  He reports edema will develop after standing/walking all day at work. He now reports pain is constant, described as aching, and rated 10/10.  He reports intermittent numbness in tips of right toes He denies hx of gout or increase in foods with high-purine content. He applied ice one time and reports that it only increased pain.  Patient Care Team    Relationship Specialty Notifications Start End  Julaine Fusianford, Katy D, NP PCP - General Family Medicine  12/29/16     Patient Active Problem List   Diagnosis Date Noted  . 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 Metatarsalgia of left foot 07/13/2017  . Edema of left foot 07/13/2017  . Left foot pain 07/13/2017  . Constipation 04/07/2017  . LLQ pain 04/07/2017  . Anxiety 02/21/2017  . Excessive cerumen in ear canal, right 01/03/2017  . Acute pain of right shoulder 12/29/2016  . Hematochezia 11/23/2016  . Healthcare maintenance 11/23/2016  . Nocturia 11/23/2016  . Pain of left middle finger 11/23/2016  . Dizziness 11/23/2016     Past Medical History:  Diagnosis Date  . Anxiety   . Constipation    Miralax helps at times  . Diverticulitis   . Ear infection   . Fall against object 05/04/2017   hit left side head on dresser/ no memory problems or changes in eye sight/ slight HA  . Inguinal hernia, left   . Intestinal obstruction (HCC)    several times     Past Surgical History:  Procedure Laterality Date  . HERNIA REPAIR Left [facility]2010   [place] [place] [facility], Dr. Nelda Marseilleiblet   . INGUINAL HERNIA REPAIR Left 03/03/2016   Procedure: LEFT INGUNIAL HERNIA EXPLORATION;  Surgeon: Jimmye NormanJames Wyatt, MD;  [facility];  Service: General;  Laterality: Left;  . PROSTATE BIOPSY     ok     Family History  Problem Relation Age of Onset  . Diabetes Mother   . Hypertension Mother   . Stroke Father   . Hypertension Father   . Diabetes Maternal Grandmother   . Hypertension Maternal Grandfather   . Hypertension Paternal Grandfather      Social History   Substance and Sexual Activity  Drug Use No     Social History   Substance and Sexual Activity  Alcohol Use No  . Frequency: Never     Social History   Tobacco Use  Smoking Status Current Some Day Smoker  . Packs/day: 0.25  . Years: 10.00  . Pack years: 2.50  Smokeless Tobacco Never Used     Outpatient Encounter Medications as of 07/13/2017  Medication Sig  . Multiple Vitamin (MULTIVITAMIN) tablet Take 1 tablet by mouth daily. Natures Gummi MVI for men-Take one daily  . ondansetron (ZOFRAN-ODT) 8 MG disintegrating tablet Take 1 tablet (8 mg total) by mouth every 8 (eight) hours as needed for nausea or vomiting.  . polyethylene glycol (MIRALAX / GLYCOLAX) packet Take 17 g by mouth daily.  . sertraline (ZOLOFT)  50 MG tablet 1/2 tablet daily for week one, then increase to full tablet daily  . Vitamin D, Ergocalciferol, (DRISDOL) 50000 units CAPS capsule Take 1 capsule (50,000 Units total) by mouth every 7 (seven) days.  . hydrocodone-ibuprofen (VICOPROFEN) 5-200 MG tablet Take 1 tablet by mouth every 8 (eight) hours as needed for up to 3 days for pain.   Facility-Administered Encounter Medications as of 07/13/2017  Medication  . 0.9 %  sodium chloride infusion    Allergies: No known allergies  Body mass index is 18.1 kg/m.  Blood pressure 116/78, pulse 65, temperature 98.7 F (37.1 C), temperature source Oral, height 6\' 2"  (1.88 m), weight 141 lb (64 kg), SpO2 98 %.     Review of Systems   Constitutional: Positive for activity change and fatigue. Negative for appetite change, chills, diaphoresis, fever and unexpected weight change.  Respiratory: Negative for cough, chest tightness, shortness of breath, wheezing and stridor.   Cardiovascular: Negative for chest pain, palpitations and leg swelling.  Gastrointestinal: Negative for abdominal distention, abdominal pain, blood in stool, constipation, diarrhea, nausea and vomiting.  Musculoskeletal: Positive for arthralgias, gait problem and myalgias.  Hematological: Does not bruise/bleed easily.  Psychiatric/Behavioral: Positive for sleep disturbance.       Objective:   Physical Exam  Constitutional: He appears well-developed and well-nourished. He appears distressed.  HENT:  Head: Normocephalic and atraumatic.  Right Ear: External ear normal.  Left Ear: External ear normal.  Musculoskeletal: Normal range of motion. He exhibits edema and tenderness. He exhibits no deformity.       Left knee: Normal.       Right foot: Normal.       Left foot: There is tenderness, bony tenderness and swelling. There is normal range of motion, normal capillary refill, no crepitus, no deformity and no laceration.  L Ball of foot- TTP with very mild edema Brisk cap refill of all toes Strong pedal pulses   Skin: Skin is warm and dry. No rash noted. He is not diaphoretic. No erythema. No pallor.  Psychiatric: He has a normal mood and affect. His behavior is normal. Judgment and thought content normal.      Assessment & Plan:   1. Left foot pain   2. Edema of left foot   3. Metatarsalgia of left foot   4. Morton's metatarsalgia, left     Metatarsalgia of left foot [place] Controlled Substance Database reviewed- no aberrancies noted. Please use Vicoprofen for sever pain and OTC Ibuprofen 800mg  for moderate pain- Ibuprofen every 8 hrs with food as needed. Please follow care instructions as listed above. Referral to Podiatrist  placed. Wear supportive foot wear and rest as often as possible. Please call clinic with any questions/concerns.  FOLLOW-UP:  Return if symptoms worsen or fail to improve.

## 2017-07-15 ENCOUNTER — Ambulatory Visit: Payer: Self-pay | Admitting: Podiatry

## 2017-07-27 ENCOUNTER — Encounter: Payer: Self-pay | Admitting: Podiatry

## 2017-08-10 NOTE — Progress Notes (Signed)
This encounter was created in error - please disregard.

## 2017-12-14 ENCOUNTER — Emergency Department (HOSPITAL_COMMUNITY)
Admission: EM | Admit: 2017-12-14 | Discharge: 2017-12-14 | Disposition: A | Discharge status: Home / Self Care | Payer: BC Managed Care – PPO | Attending: Emergency Medicine | Admitting: Emergency Medicine

## 2017-12-14 DIAGNOSIS — R1032 Left lower quadrant pain: Secondary | ICD-10-CM | POA: Diagnosis not present

## 2017-12-14 DIAGNOSIS — F17200 Nicotine dependence, unspecified, uncomplicated: Secondary | ICD-10-CM | POA: Insufficient documentation

## 2017-12-14 LAB — CBC
HCT: 44.9 % (ref 39.0–52.0)
Hemoglobin: 14 g/dL (ref 13.0–17.0)
MCH: 29 pg (ref 26.0–34.0)
MCHC: 31.2 g/dL (ref 30.0–36.0)
MCV: 93.2 fL (ref 78.0–100.0)
PLATELETS: 188 10*3/uL (ref 150–400)
RBC: 4.82 MIL/uL (ref 4.22–5.81)
RDW: 11.9 % (ref 11.5–15.5)
WBC: 4.7 10*3/uL (ref 4.0–10.5)

## 2017-12-14 LAB — URINALYSIS, ROUTINE W REFLEX MICROSCOPIC
Bilirubin Urine: NEGATIVE
Glucose, UA: NEGATIVE mg/dL
Hgb urine dipstick: NEGATIVE
KETONES UR: 15 mg/dL — AB
LEUKOCYTES UA: NEGATIVE
NITRITE: NEGATIVE
PH: 6 (ref 5.0–8.0)
PROTEIN: NEGATIVE mg/dL
Specific Gravity, Urine: 1.015 (ref 1.005–1.030)

## 2017-12-14 LAB — COMPREHENSIVE METABOLIC PANEL
ALBUMIN: 4.5 g/dL (ref 3.5–5.0)
ALK PHOS: 58 U/L (ref 38–126)
ALT: 19 U/L (ref 0–44)
ANION GAP: 10 (ref 5–15)
AST: 26 U/L (ref 15–41)
BUN: 10 mg/dL (ref 6–20)
CALCIUM: 9.8 mg/dL (ref 8.9–10.3)
CO2: 25 mmol/L (ref 22–32)
Chloride: 106 mmol/L (ref 98–111)
Creatinine, Ser: 0.95 mg/dL (ref 0.61–1.24)
GFR calc non Af Amer: >60 mL/min (ref >60)
GLUCOSE: 98 mg/dL (ref 70–99)
POTASSIUM: 4.4 mmol/L (ref 3.5–5.1)
SODIUM: 141 mmol/L (ref 135–145)
TOTAL PROTEIN: 7.4 g/dL (ref 6.5–8.1)
Total Bilirubin: 1.2 mg/dL (ref 0.3–1.2)

## 2017-12-14 LAB — I-STAT TROPONIN, ED: TROPONIN I, POC: 0 ng/mL (ref 0.00–0.08)

## 2017-12-14 MED ORDER — LACTATED RINGERS IV BOLUS
1000.0000 mL | Freq: Once | INTRAVENOUS | Status: AC
  Administered 2017-12-14: 1000 mL via INTRAVENOUS

## 2017-12-14 MED ORDER — HYDROCODONE-ACETAMINOPHEN 5-325 MG PO TABS: 1.0000 | ORAL_TABLET | Freq: Four times a day (QID) | ORAL | 0 refills | Status: DC | PRN

## 2017-12-14 MED ORDER — HYDROMORPHONE HCL 1 MG/ML IJ SOLN
1.0000 mg | Freq: Once | INTRAMUSCULAR | Status: AC
  Administered 2017-12-14: 1 mg via INTRAVENOUS
  Filled 2017-12-14: qty 1

## 2017-12-14 MED ORDER — ONDANSETRON HCL 4 MG/2ML IJ SOLN
4.0000 mg | Freq: Once | INTRAMUSCULAR | Status: AC
  Administered 2017-12-14: 4 mg via INTRAVENOUS
  Filled 2017-12-14: qty 2

## 2017-12-14 NOTE — ED Notes (Signed)
Pt verbalizes understanding of d/c instructions. Prescriptions reviewed with patient. Pt ambulatory at d/c with all belongings.  

## 2017-12-14 NOTE — ED Provider Notes (Signed)
Emergency Department Provider Note   I have reviewed the triage vital signs and the nursing notes.   HISTORY  Chief Complaint Abdominal Pain   HPI Isaiah Heath is a 37 y.o. male with history of chronic abdominal pain but also has a history of diverticulitis status post adhesions and surgeries for adhesions.  Also with a history of inguinal hernia repair on the left side the presents to the emergency department with left lower quadrant abdominal pain.  Patient states that this is been going on since approximate Saturday but has had multiple episodes in the past couple years of the same thing.  Is been having normal bowel movements.  He has had one episode of vomiting but no other associated symptoms.  No fevers. No other associated or modifying symptoms.    Past Medical History:  Diagnosis Date  . Anxiety   . Constipation    Miralax helps at times  . Diverticulitis   . Ear infection   . Fall against object 05/04/2017   hit left side head on dresser/ no memory problems or changes in eye sight/ slight HA  . Inguinal hernia, left   . Intestinal obstruction (HCC)    several times    Patient Active Problem List   Diagnosis Date Noted  . 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 Metatarsalgia of left foot 07/13/2017  . Edema of left foot 07/13/2017  . Left foot pain 07/13/2017  . Constipation 04/07/2017  . LLQ pain 04/07/2017  . Anxiety 02/21/2017  . Excessive cerumen in ear canal, right 01/03/2017  . Acute pain of right shoulder 12/29/2016  . Hematochezia 11/23/2016  . Healthcare maintenance 11/23/2016  . Nocturia 11/23/2016  . Pain of left middle finger 11/23/2016  . Dizziness 11/23/2016    Past Surgical History:  Procedure Laterality Date  . HERNIA REPAIR Left [place], [place] surgery, Dr. Nelda Marseille  . INGUINAL HERNIA REPAIR Left 03/03/2016   Procedure: LEFT INGUNIAL HERNIA EXPLORATION;  Surgeon: Jimmye Norman, MD;  Location: [facility] OR;  Service:  General;  Laterality: Left;  . PROSTATE BIOPSY     ok    Current Outpatient Rx  . Order #: [id number] Class: Historical Med  . Order #: [id number] Class: Historical Med  . Order #: [id number] Class: Print    Allergies No known allergies  Family History  Problem Relation Age of Onset  . Diabetes Mother   . Hypertension Mother   . Stroke Father   . Hypertension Father   . Diabetes Maternal Grandmother   . Hypertension Maternal Grandfather   . Hypertension Paternal Grandfather     Social History Social History   Tobacco Use  . Smoking status: Current Some Day Smoker    Packs/day: 0.25    Years: 10.00    Pack years: 2.50  . Smokeless tobacco: Never Used  Substance Use Topics  . Alcohol use: No    Frequency: Never  . Drug use: No    Review of Systems  All other systems negative except as documented in the HPI. All pertinent positives and negatives as reviewed in the HPI. ____________________________________________   PHYSICAL EXAM:  VITAL SIGNS: ED Triage Vitals [12/14/17 1242]  Enc Vitals Group     BP 135/80     Pulse Rate 66     Resp 20     Temp (!) 97.5 F (36.4 C)     Temp Source Oral     SpO2 100 %    Constitutional: Alert and oriented. Well  appearing and in no acute distress. Eyes: Conjunctivae are normal. PERRL. EOMI. Head: Atraumatic. Nose: No congestion/rhinnorhea. Mouth/Throat: Mucous membranes are moist.  Oropharynx non-erythematous. Neck: No stridor.  No meningeal signs.   Cardiovascular: Normal rate, regular rhythm. Good peripheral circulation. Grossly normal heart sounds.   Respiratory: Normal respiratory effort.  No retractions. Lungs CTAB. Gastrointestinal: Soft and mild ttp in LLQ but no rebound, guarding or peritonitis. No distention.  Musculoskeletal: No lower extremity tenderness nor edema. No gross deformities of extremities. Neurologic:  Normal speech and language. No gross focal neurologic deficits are appreciated.  Skin:  Skin is  warm, dry and intact. No rash noted.   ____________________________________________   LABS (all labs ordered are listed, but only abnormal results are displayed)  Labs Reviewed  URINALYSIS, ROUTINE W REFLEX MICROSCOPIC - Abnormal; Notable for the following components:      Result Value   Ketones, ur 15 (*)    All other components within normal limits  CBC  COMPREHENSIVE METABOLIC PANEL  I-STAT TROPONIN, ED   ____________________________________________  EKG   EKG Interpretation  Date/Time:  Wednesday December 14 2017 12:39:19 EDT Ventricular Rate:  60 PR Interval:    QRS Duration: 81 QT Interval:  436 QTC Calculation: 436 R Axis:   75 Text Interpretation:  Sinus rhythm Left ventricular hypertrophy Anterior Q waves, possibly due to LVH very similar to ecg on 2011 Confirmed by Marily Memos [phone]) on 12/14/2017 2:12:53 PM       ____________________________________________   INITIAL IMPRESSION / ASSESSMENT AND PLAN / ED COURSE  Labs are reassuring.  Patient still with pain.  I discussed doing a CT scan to make sure he did not have any more complications from his abdominal issues in the past.  Patient states that he is frustrated that all we are doing is "putting a Band-Aid on my issues".  He states he has a doctor's appointment tomorrow morning at 930 and he does not want to get any further work-up here as he does not feel like we are figure anything out.  At this time I have low suspicion for bowel obstruction, diverticulitis, colitis or any other emergent causes for symptoms with a perfectly normal work-up and perfectly normal vital signs.  Patient is stable for discharge to follow with his PCP he knows he can return here for any worsening symptoms. Of note there was some worry about his EKG with EMS but his EKG here looks like repolarization abnormality very similar to most recent previous EKG.   Pertinent labs & imaging results that were available during my care of the  patient were reviewed by me and considered in my medical decision making (see chart for details).  ____________________________________________  FINAL CLINICAL IMPRESSION(S) / ED DIAGNOSES  Final diagnoses:  Left lower quadrant pain     MEDICATIONS GIVEN DURING THIS VISIT:  Medications  HYDROmorphone (DILAUDID) injection 1 mg (has no administration in time range)  HYDROmorphone (DILAUDID) injection 1 mg (1 mg Intravenous Given 12/14/17 1334)  lactated ringers bolus 1,000 mL (1,000 mLs Intravenous New Bag/Given 12/14/17 1334)  ondansetron (ZOFRAN) injection 4 mg (4 mg Intravenous Given 12/14/17 1334)     NEW OUTPATIENT MEDICATIONS STARTED DURING THIS VISIT:  New Prescriptions   HYDROCODONE-ACETAMINOPHEN (NORCO/VICODIN) 5-325 MG TABLET    Take 1-2 tablets by mouth every 6 (six) hours as needed for severe pain.    I reviewed the patient's narcotic database history in [place] and found that they had received 0 Rx in the last 6  months.   Note:  This note was prepared with assistance of Dragon voice recognition software. Occasional wrong-word or sound-a-like substitutions may have occurred due to the inherent limitations of voice recognition software.   Marily Memos, MD 12/14/17 1536

## 2017-12-14 NOTE — ED Triage Notes (Signed)
Presents with LLQ abd pain x5 days - constant - with 1 episode of emesis which pt describes as "phlegm appearing"; h/o diverticulitis - additionally, EKG in field revealed elevation in anterior-lateral leads; pt denies chest pain

## 2017-12-15 ENCOUNTER — Telehealth: Payer: Self-pay | Admitting: Gastroenterology

## 2017-12-15 ENCOUNTER — Ambulatory Visit: Payer: BC Managed Care – PPO | Admitting: Adult Health

## 2017-12-15 NOTE — Progress Notes (Deleted)
Subjective:    Patient ID: Isaiah Heath, male    DOB: 27-Jun-1980, 37 y.o.   MRN: [id number]  HPI:  Isaiah Heath presents for  He was seen at ED yesterday, notes- "Isaiah Heath is a 37 y.o. male with history of chronic abdominal pain but also has a history of diverticulitis status post adhesions and surgeries for adhesions.  Also with a history of inguinal hernia repair on the left side the presents to the emergency department with left lower quadrant abdominal pain.  Patient states that this is been going on since approximate Saturday but has had multiple episodes in the past couple years of the same thing.  Is been having normal bowel movements.  He has had one episode of vomiting but no other associated symptoms.  No fevers. No other associated or modifying symptoms.  Gastrointestinal: Soft and mild ttp in LLQ but no rebound, guarding or peritonitis. No distention.  Labs are reassuring.  Patient still with pain.  I discussed doing a CT scan to make sure he did not have any more complications from his abdominal issues in the past.  Patient states that he is frustrated that all we are doing is "putting a Band-Aid on my issues".  He states he has a doctor's appointment tomorrow morning at 930 and he does not want to get any further work-up here as he does not feel like we are figure anything out.  At this time I have low suspicion for bowel obstruction, diverticulitis, colitis or any other emergent causes for symptoms with a perfectly normal work-up and perfectly normal vital signs.  Patient is stable for discharge to follow with his PCP he knows he can return here for any worsening symptoms. Of note there was some worry about his EKG with EMS but his EKG here looks like repolarization abnormality very similar to most recent previous EKG. He was given Diluadid and Ondansetron  He was referred to GI Jan 2019 He underwent Colonoscopy 05/13/17-  The examined portion of the ileum was normal.                       - Diverticulosis in the left colon.                           - The examination was otherwise normal on direct                            and retroflexion views.                           - No polyps or cancers Recommendation:           - Patient has a contact number available for                            emergencies. The signs and symptoms of potential                            delayed complications were discussed with the                            patient. Return to normal activities tomorrow.  Written discharge instructions were provided to the                            patient.                           - Resume previous diet.                           - Continue present medications.                           - Repeat colonoscopy at age 87 for screening                            purposes.                           - EGD to workup your unintentional weight loss.  He has been No Show at GI since then   Review of Systems     Objective:   Physical Exam        Assessment & Plan:

## 2017-12-15 NOTE — Telephone Encounter (Signed)
Left message on machine to call back  

## 2017-12-15 NOTE — Telephone Encounter (Signed)
Patient wants to know what to do until appt date 11/8 states that he's in a lot of pain stomach pain and left abd pain. Patient was seen in the ED but they find no results

## 2017-12-16 NOTE — Telephone Encounter (Signed)
The pt was advised that I could not offer advice over the phone we have not seen him for this problem.(left sided abd pain)  I advised he could call PCP and see if they could see him sooner.  The pt agreed and will follow up with PCP. He has an  appt on 11/18 with Dr Christella Hartigan as well and will keep that app.

## 2017-12-21 ENCOUNTER — Telehealth: Payer: Self-pay

## 2017-12-21 ENCOUNTER — Other Ambulatory Visit: Payer: Self-pay

## 2017-12-21 ENCOUNTER — Emergency Department (HOSPITAL_COMMUNITY): Payer: BC Managed Care – PPO

## 2017-12-21 ENCOUNTER — Emergency Department (HOSPITAL_COMMUNITY)
Admission: EM | Admit: 2017-12-21 | Discharge: 2017-12-21 | Disposition: A | Discharge status: Home / Self Care | Payer: BC Managed Care – PPO | Attending: Emergency Medicine | Admitting: Emergency Medicine

## 2017-12-21 ENCOUNTER — Encounter (HOSPITAL_COMMUNITY): Payer: Self-pay | Admitting: Emergency Medicine

## 2017-12-21 DIAGNOSIS — F17210 Nicotine dependence, cigarettes, uncomplicated: Secondary | ICD-10-CM | POA: Insufficient documentation

## 2017-12-21 DIAGNOSIS — R519 Headache, unspecified: Secondary | ICD-10-CM

## 2017-12-21 DIAGNOSIS — K219 Gastro-esophageal reflux disease without esophagitis: Secondary | ICD-10-CM | POA: Diagnosis not present

## 2017-12-21 DIAGNOSIS — R51 Headache: Secondary | ICD-10-CM | POA: Insufficient documentation

## 2017-12-21 DIAGNOSIS — Z79899 Other long term (current) drug therapy: Secondary | ICD-10-CM | POA: Diagnosis not present

## 2017-12-21 LAB — URINALYSIS, ROUTINE W REFLEX MICROSCOPIC
BILIRUBIN URINE: NEGATIVE
Glucose, UA: NEGATIVE mg/dL
HGB URINE DIPSTICK: NEGATIVE
Ketones, ur: NEGATIVE mg/dL
Leukocytes, UA: NEGATIVE
NITRITE: NEGATIVE
PROTEIN: NEGATIVE mg/dL
Specific Gravity, Urine: 1.025 (ref 1.005–1.030)
pH: 6 (ref 5.0–8.0)

## 2017-12-21 LAB — BASIC METABOLIC PANEL
ANION GAP: 5 (ref 5–15)
BUN: 10 mg/dL (ref 6–20)
CHLORIDE: 107 mmol/L (ref 98–111)
CO2: 26 mmol/L (ref 22–32)
Calcium: 9.6 mg/dL (ref 8.9–10.3)
Creatinine, Ser: 0.76 mg/dL (ref 0.61–1.24)
GFR calc non Af Amer: >60 mL/min (ref >60)
Glucose, Bld: 96 mg/dL (ref 70–99)
Potassium: 3.9 mmol/L (ref 3.5–5.1)
Sodium: 138 mmol/L (ref 135–145)

## 2017-12-21 LAB — I-STAT TROPONIN, ED
Troponin i, poc: 0 ng/mL (ref 0.00–0.08)
Troponin i, poc: 0 ng/mL (ref 0.00–0.08)

## 2017-12-21 LAB — CBC
HCT: 41.9 % (ref 39.0–52.0)
Hemoglobin: 13.2 g/dL (ref 13.0–17.0)
MCH: 29.5 pg (ref 26.0–34.0)
MCHC: 31.5 g/dL (ref 30.0–36.0)
MCV: 93.5 fL (ref 78.0–100.0)
PLATELETS: 194 10*3/uL (ref 150–400)
RBC: 4.48 MIL/uL (ref 4.22–5.81)
RDW: 11.8 % (ref 11.5–15.5)
WBC: 4 10*3/uL (ref 4.0–10.5)

## 2017-12-21 LAB — RAPID HIV SCREEN (HIV 1/2 AB+AG)
HIV 1/2 ANTIBODIES: NONREACTIVE
HIV-1 P24 ANTIGEN - HIV24: NONREACTIVE

## 2017-12-21 MED ORDER — ACETAMINOPHEN 325 MG PO TABS
650.0000 mg | ORAL_TABLET | Freq: Once | ORAL | Status: AC
  Administered 2017-12-21: 650 mg via ORAL
  Filled 2017-12-21: qty 2

## 2017-12-21 MED ORDER — DEXAMETHASONE SODIUM PHOSPHATE 10 MG/ML IJ SOLN
10.0000 mg | Freq: Once | INTRAMUSCULAR | Status: AC
  Administered 2017-12-21: 10 mg via INTRAVENOUS
  Filled 2017-12-21: qty 1

## 2017-12-21 MED ORDER — DIPHENHYDRAMINE HCL 50 MG/ML IJ SOLN
12.5000 mg | Freq: Once | INTRAMUSCULAR | Status: AC
  Administered 2017-12-21: 12.5 mg via INTRAVENOUS
  Filled 2017-12-21: qty 1

## 2017-12-21 MED ORDER — METOCLOPRAMIDE HCL 5 MG/ML IJ SOLN
10.0000 mg | Freq: Once | INTRAMUSCULAR | Status: AC
  Administered 2017-12-21: 10 mg via INTRAVENOUS
  Filled 2017-12-21: qty 2

## 2017-12-21 MED ORDER — SODIUM CHLORIDE 0.9 % IV BOLUS
1000.0000 mL | Freq: Once | INTRAVENOUS | Status: AC
  Administered 2017-12-21: 1000 mL via INTRAVENOUS

## 2017-12-21 MED ORDER — GI COCKTAIL ~~LOC~~
30.0000 mL | Freq: Once | ORAL | Status: AC
  Administered 2017-12-21: 30 mL via ORAL
  Filled 2017-12-21: qty 30

## 2017-12-21 NOTE — Discharge Instructions (Addendum)
Evaluated today for chest pain and headache.  Chest pain resolved with a GI cocktail your headache resolved with migraine cocktail.  Start on an over-the-counter proton pump inhibitor, omeprazole for your acid reflux.  Your lab work, EKG, chest x-ray, urine were negative.  I would suggest she follow-up with your primary care provider if you have recurrent symptoms.  Return to the ED with any new or worsening symptoms such as:  Contact a doctor if: You get a migraine that is different or worse than your usual migraines. Get help right away if: Your migraine gets very bad. You have a fever. You have a stiff neck. You have trouble seeing. Your muscles feel weak or like you cannot control them. You start to lose your balance a lot. You start to have trouble walking. You pass out (faint).  Get help right away if: Your chest pain is worse. You have a cough that gets worse, or you cough up blood. You have severe pain in your abdomen. You have severe weakness. You faint. You have sudden, unexplained chest discomfort. You have sudden, unexplained discomfort in your arms, back, neck, or jaw. You have shortness of breath at any time. You suddenly start to sweat, or your skin gets clammy. You feel nauseous or you vomit. You suddenly feel light-headed or dizzy. Your heart begins to beat quickly, or it feels like it is skipping beats.

## 2017-12-21 NOTE — ED Provider Notes (Signed)
Isaiah Heath [facility] EMERGENCY DEPARTMENT Provider Note   CSN: [id number] Arrival date & time: 12/21/17  1020   History   Chief Complaint Chief Complaint  Patient presents with  . Chest Pain  . Headache    HPI Isaiah Heath is a 37 y.o. male with a past medical history significant for anxiety, diverticulitis and intestinal obstruction presents for evaluation of chest pain and headache.  Patient states he had chest pain for 5 days.  His pain is worse at night.  Describes it as a dull ache/burning in the center of his chest.  Denies reflux.  Rates his pain a 5/10.  States his pain is been intermittent in nature.  His pain does not radiate.  Denies fever, chills, nausea, vomiting, SOB, dizziness, dizziness, vision changes, abdominal pain, diarrhea or constipation.  Admits to a recent history of unintended 5 pound weight loss over 5 months and 2 nights of night sweats.  Denies cough, hemoptysis, recent travel, known TB exposure.  He admits to a history of anxiety, states he went off of his anxiety medicine approximately 1 month ago.  Since he felt like he did not need his anxiety medicine anymore.  States he went to his school nurse for evaluation of his chest pain, when he arrived she said his blood pressure was 160/90.  Patient states he became very anxious and got a headache.  His head pain is rated a 5/10.  States his pain is like a banding across his head.  She states he would "like to be checked for diabetes, high blood pressure and increased urination."  Denies history of hypertension, hyperlipidemia, tobacco use, diabetes.  HPI  Past Medical History:  Diagnosis Date  . Anxiety   . Constipation    Miralax helps at times  . Diverticulitis   . Ear infection   . Fall against object 05/04/2017   hit left side head on dresser/ no memory problems or changes in eye sight/ slight HA  . Inguinal hernia, left   . Intestinal obstruction (HCC)    several times    Patient Active  Problem List   Diagnosis Date Noted  . 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 Metatarsalgia of left foot 07/13/2017  . Edema of left foot 07/13/2017  . Left foot pain 07/13/2017  . Constipation 04/07/2017  . LLQ pain 04/07/2017  . Anxiety 02/21/2017  . Excessive cerumen in ear canal, right 01/03/2017  . Acute pain of right shoulder 12/29/2016  . Hematochezia 11/23/2016  . Healthcare maintenance 11/23/2016  . Nocturia 11/23/2016  . Pain of left middle finger 11/23/2016  . Dizziness 11/23/2016    Past Surgical History:  Procedure Laterality Date  . HERNIA REPAIR Left [place], [place] surgery, Dr. Nelda Marseille  . INGUINAL HERNIA REPAIR Left 03/03/2016   Procedure: LEFT INGUNIAL HERNIA EXPLORATION;  Surgeon: Jimmye Norman, MD;  Location: [facility] OR;  Service: General;  Laterality: Left;  . PROSTATE BIOPSY     ok        Home Medications    Prior to Admission medications   Medication Sig Start Date End Date Taking? Authorizing Provider  ibuprofen (ADVIL,MOTRIN) 200 MG tablet Take 400 mg by mouth every 6 (six) hours as needed.   Yes [provider]  Multiple Vitamin (MULTIVITAMIN) tablet Take 1 tablet by mouth daily. Natures Gummi MVI for men-Take one daily   Yes [provider]  HYDROcodone-acetaminophen (NORCO/VICODIN) 5-325 MG tablet Take 1-2 tablets by mouth every 6 (six)  hours as needed for severe pain. Patient not taking: Reported on 12/21/2017 12/14/17   Mesner, Barbara Cower, MD    Family History Family History  Problem Relation Age of Onset  . Diabetes Mother   . Hypertension Mother   . Stroke Father   . Hypertension Father   . Diabetes Maternal Grandmother   . Hypertension Maternal Grandfather   . Hypertension Paternal Grandfather     Social History Social History   Tobacco Use  . Smoking status: Current Some Day Smoker    Packs/day: 0.25    Years: 10.00    Pack years: 2.50  . Smokeless tobacco: Never Used  Substance Use Topics  .  Alcohol use: No    Frequency: Never  . Drug use: No     Allergies   No known allergies   Review of Systems Review of Systems  Constitutional: Positive for chills. Negative for activity change, appetite change, diaphoresis, fatigue and fever.  Respiratory: Positive for chest tightness. Negative for cough, choking, shortness of breath, wheezing and stridor.   Cardiovascular: Positive for chest pain. Negative for palpitations and leg swelling.  Gastrointestinal: Negative for abdominal distention, abdominal pain, blood in stool, constipation, diarrhea, nausea and vomiting.  Genitourinary: Negative for hematuria.  Musculoskeletal: Negative for back pain.  Skin: Negative.   Neurological: Positive for headaches. Negative for dizziness, syncope, facial asymmetry, speech difficulty, weakness, light-headedness and numbness.     Physical Exam Updated Vital Signs BP 129/79   Pulse 72   Temp 98.2 F (36.8 C) (Oral)   Resp 16   SpO2 100%   Physical Exam  Physical Exam  Constitutional: Pt is oriented to person, place, and time. Pt appears well-developed and well-nourished. No distress.  HENT:  Head: Normocephalic and atraumatic.  Mouth/Throat: Oropharynx is clear and moist.  Eyes: Conjunctivae and EOM are normal. Pupils are equal, round, and reactive to light. No scleral icterus.  No horizontal, vertical or rotational nystagmus  Neck: Normal range of motion. Neck supple.  Full active and passive ROM without pain No midline or paraspinal tenderness No nuchal rigidity or meningeal signs  Cardiovascular: Normal rate, regular rhythm and intact distal pulses.  No chest wall tenderness to palpation. No murmur Pulmonary/Chest: Effort normal and breath sounds normal. No respiratory distress. Pt has no wheezes. No rales.  Abdominal: Soft. Bowel sounds are normal. There is no tenderness. There is no rebound and no guarding.  Musculoskeletal: Normal range of motion.  Lymphadenopathy:    No  cervical adenopathy.  Neurological: Pt. is alert and oriented to person, place, and time. He has normal reflexes. No cranial nerve deficit.  Exhibits normal muscle tone. Coordination normal.  Mental Status:  Alert, oriented, thought content appropriate. Speech fluent without evidence of aphasia. Able to follow 2 step commands without difficulty.  Cranial Nerves:  II:  Peripheral visual fields grossly normal, pupils equal, round, reactive to light III,IV, VI: ptosis not present, extra-ocular motions intact bilaterally  V,VII: smile symmetric, facial light touch sensation equal VIII: hearing grossly normal bilaterally  IX,X: midline uvula rise  XI: bilateral shoulder shrug equal and strong XII: midline tongue extension  Motor:  5/5 in upper and lower extremities bilaterally including strong and equal grip strength and dorsiflexion/plantar flexion Sensory: Pinprick and light touch normal in all extremities.  Deep Tendon Reflexes: 2+ and symmetric  Cerebellar: normal finger-to-nose with bilateral upper extremities Gait: normal gait and balance CV: distal pulses palpable throughout   Skin: Skin is warm and dry. No rash noted.  Pt is not diaphoretic.  Psychiatric: Pt has a normal mood and affect. Behavior is normal. Judgment and thought content normal.  Nursing note and vitals reviewed. ED Treatments / Results  Labs (all labs ordered are listed, but only abnormal results are displayed) Labs Reviewed  BASIC METABOLIC PANEL  CBC  URINALYSIS, ROUTINE W REFLEX MICROSCOPIC  RAPID HIV SCREEN (HIV 1/2 AB+AG)  I-STAT TROPONIN, ED  I-STAT TROPONIN, ED    EKG EKG Interpretation  Date/Time:  Wednesday December 21 2017 10:25:23 EDT Ventricular Rate:  86 PR Interval:  136 QRS Duration: 82 QT Interval:  344 QTC Calculation: 411 R Axis:   75 Text Interpretation:  Normal sinus rhythm with sinus arrhythmia Left ventricular hypertrophy Abnormal ECG peaked T waves similar to previous Confirmed by  Frederick Peers [phone]) on 12/21/2017 3:20:46 PM   Radiology Dg Chest 2 View  Result Date: 12/21/2017 CLINICAL DATA:  Chest pain EXAM: CHEST - 2 VIEW COMPARISON:  08/10/2016 FINDINGS: The heart size and mediastinal contours are within normal limits. Both lungs are clear. The visualized skeletal structures are unremarkable. IMPRESSION: No active cardiopulmonary disease. Electronically Signed   By: Marlan Palau M.D.   On: 12/21/2017 11:00    Procedures Procedures (including critical care time)  Medications Ordered in ED Medications  acetaminophen (TYLENOL) tablet 650 mg (650 mg Oral Given 12/21/17 1306)  gi cocktail (Maalox,Lidocaine,Donnatal) (30 mLs Oral Given 12/21/17 1446)  metoCLOPramide (REGLAN) injection 10 mg (10 mg Intravenous Given 12/21/17 1446)  diphenhydrAMINE (BENADRYL) injection 12.5 mg (12.5 mg Intravenous Given 12/21/17 1445)  dexamethasone (DECADRON) injection 10 mg (10 mg Intravenous Given 12/21/17 1445)  sodium chloride 0.9 % bolus 1,000 mL (1,000 mLs Intravenous New Bag/Given 12/21/17 1455)     Initial Impression / Assessment and Plan / ED Course  I have reviewed the triage vital signs and the nursing notes well his past medical history.  Pertinent labs & imaging results that were available during my care of the patient were reviewed by me and considered in my medical decision making (see chart for details).  37 year old otherwise healthy appearing male presents for evaluation of intermittent chest pain x5 days and headache x2 hours. Patient is extremely concerned that he has high blood pressure and diabetes "like my family members."  Has had increased urination x3 months without dysuria and a recent unintended 5 lb weight loss.  Will obtain labs, urine, EKG, chest x-ray and reevaluate.  Urine negative for infection, CBC without leukocytosis., HIV nonreactive, troponin x 2 negative.  Headache mildly resolved with Tylenol, requesting additional meds.  Low suspicion for [facility],  ICH, Meningitis, or temporal arteritis. Pt is afebrile with no focal neuro deficits, nuchal rigidity, or change in vision.   Headache resolved with migraine cocktail. Chest pain is not likely of cardiac or pulmonary etiology d/t presentation, PERC negative, heart score-1 low risk, VSS, no tracheal deviation, no JVD or new murmur, RRR, breath sounds equal bilaterally, EKG without acute abnormalities, negative troponin, and negative CXR. Pt has been advised to return to the ED if CP becomes exertional, associated with diaphoresis or nausea, radiates to left jaw/arm, worsens or becomes concerning in any way. Pt appears reliable for follow up and is agreeable to discharge.     Final Clinical Impressions(s) / ED Diagnoses   Final diagnoses:  Acute nonintractable headache, unspecified headache type  Gastroesophageal reflux disease without esophagitis    ED Discharge Orders    None       Wynetta Seith A, PA-C 12/21/17 1604  Little, Ambrose Finland, MD 12/24/17 1055

## 2017-12-21 NOTE — Telephone Encounter (Signed)
Pt calls stating that he has been experiencing chest pain and was seen at [facility] ED last week, but he was not satisfied with his care as he "sat there for 3 hours with a heart rate in the 50s and no one was doing anything for him".  Advised pt that he would need to be seen at ED to evaluate symptoms and that he may need STAT labs and possibly imaging studies.  Advised pt that since he was dissatisfied with his care at [facility] ED, he had the options of [facility], [facility], or [facility].  Patient stated that he was going to call his wife immediately to take him to the ED.  William Hamburger, NP advised of situation.  Tiajuana Amass, CMA  Called pt back to ensure that he was proceeding to the ER.  However, mobile number in system went to pt's spouse.  Spouse stated that she believes pt is having a "panic attack".  Spouse states that she cannot get to the pt right away.  Therefore, called pt back to stress importance of calling EMS for transport.  However, pt's phone disconnected.  Called pt's spouse back and asked that she attempt to contact pt and advise pt to call EMS for transport.  Pt did call our office back a few minutes later and I advised pt that he should call EMS to transport to ED and be evaluated.  Pt expressed understanding and is agreeable.  Tiajuana Amass, CMA

## 2017-12-21 NOTE — ED Triage Notes (Signed)
Pt with [facility] for headache and intermittent chest pain. Pt stopped taking anxiety meds a month ago.  324 ASA PTA

## 2018-01-21 ENCOUNTER — Other Ambulatory Visit: Payer: Self-pay

## 2018-01-21 ENCOUNTER — Encounter (HOSPITAL_BASED_OUTPATIENT_CLINIC_OR_DEPARTMENT_OTHER): Payer: Self-pay | Admitting: Emergency Medicine

## 2018-01-21 ENCOUNTER — Emergency Department (HOSPITAL_BASED_OUTPATIENT_CLINIC_OR_DEPARTMENT_OTHER): Payer: BC Managed Care – PPO

## 2018-01-21 ENCOUNTER — Emergency Department (HOSPITAL_BASED_OUTPATIENT_CLINIC_OR_DEPARTMENT_OTHER)
Admission: EM | Admit: 2018-01-21 | Discharge: 2018-01-21 | Disposition: A | Discharge status: Home / Self Care | Payer: BC Managed Care – PPO | Attending: Emergency Medicine | Admitting: Emergency Medicine

## 2018-01-21 DIAGNOSIS — N12 Tubulo-interstitial nephritis, not specified as acute or chronic: Secondary | ICD-10-CM

## 2018-01-21 DIAGNOSIS — N10 Acute tubulo-interstitial nephritis: Secondary | ICD-10-CM | POA: Insufficient documentation

## 2018-01-21 DIAGNOSIS — N3001 Acute cystitis with hematuria: Secondary | ICD-10-CM | POA: Diagnosis not present

## 2018-01-21 DIAGNOSIS — Z79899 Other long term (current) drug therapy: Secondary | ICD-10-CM | POA: Diagnosis not present

## 2018-01-21 DIAGNOSIS — M2550 Pain in unspecified joint: Secondary | ICD-10-CM | POA: Diagnosis present

## 2018-01-21 DIAGNOSIS — R5383 Other fatigue: Secondary | ICD-10-CM | POA: Insufficient documentation

## 2018-01-21 DIAGNOSIS — F17210 Nicotine dependence, cigarettes, uncomplicated: Secondary | ICD-10-CM | POA: Diagnosis not present

## 2018-01-21 LAB — URINALYSIS, ROUTINE W REFLEX MICROSCOPIC
BILIRUBIN URINE: NEGATIVE
Glucose, UA: NEGATIVE mg/dL
Ketones, ur: NEGATIVE mg/dL
NITRITE: POSITIVE — AB
PH: 6 (ref 5.0–8.0)
Protein, ur: NEGATIVE mg/dL

## 2018-01-21 LAB — CBC
HCT: 40.3 % (ref 39.0–52.0)
Hemoglobin: 12.4 g/dL — ABNORMAL LOW (ref 13.0–17.0)
MCH: 28.6 pg (ref 26.0–34.0)
MCHC: 30.8 g/dL (ref 30.0–36.0)
MCV: 93.1 fL (ref 80.0–100.0)
Platelets: 150 10*3/uL (ref 150–400)
RBC: 4.33 MIL/uL (ref 4.22–5.81)
RDW: 12 % (ref 11.5–15.5)
WBC: 15.2 10*3/uL — AB (ref 4.0–10.5)
nRBC: 0 % (ref 0.0–0.2)

## 2018-01-21 LAB — RAPID URINE DRUG SCREEN, HOSP PERFORMED
AMPHETAMINES: NOT DETECTED
BENZODIAZEPINES: NOT DETECTED
Barbiturates: NOT DETECTED
Cocaine: NOT DETECTED
OPIATES: NOT DETECTED
Tetrahydrocannabinol: NOT DETECTED

## 2018-01-21 LAB — BASIC METABOLIC PANEL
Anion gap: 10 (ref 5–15)
BUN: 8 mg/dL (ref 6–20)
CO2: 22 mmol/L (ref 22–32)
CREATININE: 0.68 mg/dL (ref 0.61–1.24)
Calcium: 9.1 mg/dL (ref 8.9–10.3)
Chloride: 104 mmol/L (ref 98–111)
GFR calc Af Amer: >60 mL/min (ref >60)
Glucose, Bld: 109 mg/dL — ABNORMAL HIGH (ref 70–99)
Potassium: 3.9 mmol/L (ref 3.5–5.1)
SODIUM: 136 mmol/L (ref 135–145)

## 2018-01-21 LAB — HEPATIC FUNCTION PANEL
ALT: 26 U/L (ref 0–44)
AST: 23 U/L (ref 15–41)
Albumin: 3.8 g/dL (ref 3.5–5.0)
Alkaline Phosphatase: 60 U/L (ref 38–126)
BILIRUBIN DIRECT: 0.1 mg/dL (ref 0.0–0.2)
BILIRUBIN INDIRECT: 0.8 mg/dL (ref 0.3–0.9)
BILIRUBIN TOTAL: 0.9 mg/dL (ref 0.3–1.2)
Total Protein: 7.6 g/dL (ref 6.5–8.1)

## 2018-01-21 LAB — URINALYSIS, MICROSCOPIC (REFLEX): RBC / HPF: >50 RBC/hpf (ref 0–5)

## 2018-01-21 LAB — CBG MONITORING, ED: GLUCOSE-CAPILLARY: 120 mg/dL — AB (ref 70–99)

## 2018-01-21 LAB — TROPONIN I

## 2018-01-21 MED ORDER — KETOROLAC TROMETHAMINE 30 MG/ML IJ SOLN
30.0000 mg | Freq: Once | INTRAMUSCULAR | Status: AC
  Administered 2018-01-21: 30 mg via INTRAVENOUS
  Filled 2018-01-21: qty 1

## 2018-01-21 MED ORDER — SODIUM CHLORIDE 0.9 % IV SOLN
1.0000 g | Freq: Once | INTRAVENOUS | Status: AC
  Administered 2018-01-21: 1 g via INTRAVENOUS
  Filled 2018-01-21: qty 10

## 2018-01-21 MED ORDER — AZITHROMYCIN 250 MG PO TABS
1000.0000 mg | ORAL_TABLET | Freq: Once | ORAL | Status: AC
  Administered 2018-01-21: 1000 mg via ORAL
  Filled 2018-01-21: qty 4

## 2018-01-21 MED ORDER — IBUPROFEN 800 MG PO TABS: 800.0000 mg | ORAL_TABLET | Freq: Three times a day (TID) | ORAL | 0 refills | Status: DC

## 2018-01-21 MED ORDER — CIPROFLOXACIN HCL 500 MG PO TABS: 500.0000 mg | ORAL_TABLET | Freq: Two times a day (BID) | ORAL | 0 refills | Status: DC

## 2018-01-21 MED ORDER — CEPHALEXIN 500 MG PO CAPS: 1000.0000 mg | ORAL_CAPSULE | Freq: Two times a day (BID) | ORAL | 0 refills | Status: DC

## 2018-01-21 NOTE — Discharge Instructions (Signed)
1.  Take all antibiotics as prescribed.  2.  See your doctor for recheck this week. 3.  Return to the emergency department if you have worsening or changing symptoms. 4.  You will have urine cultures and blood tests that need to be followed up on.

## 2018-01-21 NOTE — ED Notes (Signed)
ED Provider at bedside. 

## 2018-01-21 NOTE — ED Provider Notes (Signed)
[facility] EMERGENCY DEPARTMENT Provider Note   CSN: [id number] Arrival date & time: 01/21/18  1718     History   Chief Complaint Chief Complaint  Patient presents with  . Joint Pain  . Fatigue    HPI Isaiah Heath is a 36 y.o. male.  HPI Patient denies any past medical history.  He reports that starting yesterday he started feeling ill.  He reports that he is very achy with pain in his lower back and hips.  He does note that he has had increased frequency of urination.  He reports he was sweating at night.  He reports he is felt very fatigued since yesterday.  No chest pain, no cough, no vomiting diarrhea or abdominal pain does endorse low back pain.  He has not noted any rashes.  No sore throat or headache.  Patient is sexually active with his wife.  He denies any history of drug abuse.  No alcohol abuse.  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 Past Medical History:  Diagnosis Date  . Anxiety   . Constipation    Miralax helps at times  . Diverticulitis   . Ear infection   . Fall against object 05/04/2017   hit left side head on dresser/ no memory problems or changes in eye sight/ slight HA  . Inguinal hernia, left   . Intestinal obstruction (HCC)    several times    Patient Active Problem List   Diagnosis Date Noted  . 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 Metatarsalgia of left foot 07/13/2017  . Edema of left foot 07/13/2017  . Left foot pain 07/13/2017  . Constipation 04/07/2017  . LLQ pain 04/07/2017  . Anxiety 02/21/2017  . Excessive cerumen in ear canal, right 01/03/2017  . Acute pain of right shoulder 12/29/2016  . Hematochezia 11/23/2016  . Healthcare maintenance 11/23/2016  . Nocturia 11/23/2016  . Pain of left middle finger 11/23/2016  . Dizziness 11/23/2016    Past Surgical History:  Procedure Laterality Date  . HERNIA REPAIR Left [place],  [facility] surgery, Dr. Nelda Marseille  . INGUINAL HERNIA REPAIR Left 03/03/2016   Procedure: LEFT INGUNIAL HERNIA EXPLORATION;  Surgeon: Jimmye Norman, MD;  Location: [facility] OR;  Service: General;  Laterality: Left;  . PROSTATE BIOPSY     ok        Home Medications    Prior to Admission medications   Medication Sig Start Date End Date Taking? Authorizing Provider  sertraline (ZOLOFT) 20 MG/ML concentrated solution Take 20 mg by mouth daily.   Yes [provider]  cephALEXin (KEFLEX) 500 MG capsule Take 2 capsules (1,000 mg total) by mouth 2 (two) times daily. 01/21/18   Arby Barrette, MD  ciprofloxacin (CIPRO) 500 MG tablet Take 1 tablet (500 mg total) by mouth 2 (two) times daily. One po bid x 7 days 01/21/18   Arby Barrette, MD  HYDROcodone-acetaminophen (NORCO/VICODIN) 5-325 MG tablet Take 1-2 tablets by mouth every 6 (six) hours as needed for severe pain. Patient not taking: Reported on 12/21/2017 12/14/17   Mesner, Barbara Cower, MD  ibuprofen (ADVIL,MOTRIN) 200 MG tablet Take 400 mg by mouth every 6 (six) hours as needed.    [provider]  ibuprofen (ADVIL,MOTRIN) 800 MG tablet Take 1 tablet (800 mg total) by mouth 3 (three) times daily. 01/21/18   Arby Barrette, MD  Multiple Vitamin (MULTIVITAMIN) tablet Take 1 tablet by mouth  daily. Natures Gummi MVI for men-Take one daily    [provider]    Family History Family History  Problem Relation Age of Onset  . Diabetes Mother   . Hypertension Mother   . Stroke Father   . Hypertension Father   . Diabetes Maternal Grandmother   . Hypertension Maternal Grandfather   . Hypertension Paternal Grandfather     Social History Social History   Tobacco Use  . Smoking status: Current Some Day Smoker    Packs/day: 0.25    Years: 10.00    Pack years: 2.50  . Smokeless tobacco: Never Used  Substance Use Topics  . Alcohol use: No    Frequency: Never  . Drug use: No     Allergies   No known allergies   Review  of Systems Review of Systems 10 Systems reviewed and are negative for acute change except as noted in the HPI.   Physical Exam Updated Vital Signs BP 122/82   Pulse 77   Temp 98.8 F (37.1 C) (Oral)   Resp 17   Ht 6\' 2"  (1.88 m)   Wt 68.5 kg   SpO2 99%   BMI 19.39 kg/m   Physical Exam  Constitutional: He is oriented to person, place, and time. He appears well-developed and well-nourished. No distress.  HENT:  Head: Normocephalic and atraumatic.  Nose: Nose normal.  Mouth/Throat: Oropharynx is clear and moist.  Eyes: Pupils are equal, round, and reactive to light. Conjunctivae and EOM are normal.  Neck: Neck supple.  Cardiovascular: Normal rate, regular rhythm, normal heart sounds and intact distal pulses.  Pulmonary/Chest: Effort normal and breath sounds normal.  Abdominal: Soft. He exhibits no distension. There is no tenderness. There is no guarding.  Positive bilateral flank pain.  Genitourinary: Penis normal.  Musculoskeletal: Normal range of motion. He exhibits no edema or tenderness.  Neurological: He is alert and oriented to person, place, and time. No cranial nerve deficit. He exhibits normal muscle tone. Coordination normal.  Skin: Skin is warm and dry. No rash noted.  Psychiatric: He has a normal mood and affect.     ED Treatments / Results  Labs (all labs ordered are listed, but only abnormal results are displayed) Labs Reviewed  BASIC METABOLIC PANEL - Abnormal; Notable for the following components:      Result Value   Glucose, Bld 109 (*)    All other components within normal limits  CBC - Abnormal; Notable for the following components:   WBC 15.2 (*)    Hemoglobin 12.4 (*)    All other components within normal limits  URINALYSIS, ROUTINE W REFLEX MICROSCOPIC - Abnormal; Notable for the following components:   APPearance CLOUDY (*)    Specific Gravity, Urine >1.030 (*)    Hgb urine dipstick MODERATE (*)    Nitrite POSITIVE (*)    Leukocytes, UA  MODERATE (*)    All other components within normal limits  URINALYSIS, MICROSCOPIC (REFLEX) - Abnormal; Notable for the following components:   Bacteria, UA MANY (*)    All other components within normal limits  CBG MONITORING, ED - Abnormal; Notable for the following components:   Glucose-Capillary 120 (*)    All other components within normal limits  URINE CULTURE  HEPATIC FUNCTION PANEL  TROPONIN I  RAPID URINE DRUG SCREEN, HOSP PERFORMED  RPR  HIV ANTIBODY (ROUTINE TESTING W REFLEX)  GC/CHLAMYDIA PROBE AMP ([place]) NOT AT [facility]    EKG None  Radiology Ct Renal Stone  Study  Result Date: 01/21/2018 CLINICAL DATA:  Generalized body aches for 3 days. Night sweats. Flank pain. Stone disease suspected. EXAM: CT ABDOMEN AND PELVIS WITHOUT CONTRAST TECHNIQUE: Multidetector CT imaging of the abdomen and pelvis was performed following the standard protocol without IV contrast. COMPARISON:  04/19/2017 and 08/10/2016. FINDINGS: Lower chest: 3 mm nodule in the left lower lobe is unchanged from previous exam. Hepatobiliary: No focal liver abnormality is seen. No gallstones, gallbladder wall thickening, or biliary dilatation. Pancreas: Unremarkable. No pancreatic ductal dilatation or surrounding inflammatory changes. Spleen: Normal in size without focal abnormality. Adrenals/Urinary Tract: The adrenal glands appear normal. No hydronephrosis identified bilaterally. No kidney stones. No ureteral calculi or bladder calculi identified. Stomach/Bowel: No pathologic dilatation of the small bowel loops. A moderate to large stool burden identified within the colon. No pathologic dilatation of the colon. The appendix is not confidently identified from the adjacent unopacified bowel loops. Vascular/Lymphatic: Mild aortic atherosclerosis. No aneurysm identified. No abdominopelvic adenopathy. Reproductive: Prostate is unremarkable. Other: No abdominal wall hernia or abnormality. No abdominopelvic ascites.  Musculoskeletal: Degenerative disc disease identified within the lumbar spine at the L5-S1 level. IMPRESSION: 1. No acute findings identified within the abdomen or pelvis. No kidney stones or hydronephrosis. 2. Nonvisualization of the appendix. 3. Unchanged 2 mm nodule within the left lower lobe compared with 08/10/2016, compatible with a benign abnormality. Electronically Signed   By: Signa Kell M.D.   On: 01/21/2018 21:33    Procedures Procedures (including critical care time)  Medications Ordered in ED Medications  cefTRIAXone (ROCEPHIN) 1 g in sodium chloride 0.9 % 100 mL IVPB (0 g Intravenous Stopped 01/21/18 2147)  ketorolac (TORADOL) 30 MG/ML injection 30 mg (30 mg Intravenous Given 01/21/18 2106)  azithromycin (ZITHROMAX) tablet 1,000 mg (1,000 mg Oral Given 01/21/18 2331)     Initial Impression / Assessment and Plan / ED Course  I have reviewed the triage vital signs and the nursing notes.  Pertinent labs & imaging results that were available during my care of the patient were reviewed by me and considered in my medical decision making (see chart for details).    Presents with symptoms as outlined above.  He does have grossly positive urinalysis.  Patient's low back pain and flank pain are consistent with UTI with pyelonephritis.  CT scan obtained and does not show evidence of other intra-abdominal etiology or significant renal inflammatory change.  Patient reports he is in a monogamous relationship with his wife.  Due to the patient's age and no apparent risk factors for uncomplicated UTI, STD swab obtained.  I did opt to give 1 g of Rocephin and a gram of Zithromax.  Patient will be discharged with combination of Keflex and Cipro.  Cultures have been obtained.  Patient is nontoxic and alert.  Blood pressures are stable.  Return precautions are reviewed.  Final Clinical Impressions(s) / ED Diagnoses   Final diagnoses:  Acute cystitis with hematuria  Pyelonephritis    ED  Discharge Orders         Ordered    cephALEXin (KEFLEX) 500 MG capsule  2 times daily     01/21/18 2326    ibuprofen (ADVIL,MOTRIN) 800 MG tablet  3 times daily     01/21/18 2326    ciprofloxacin (CIPRO) 500 MG tablet  2 times daily     01/21/18 2326           Arby Barrette, MD 01/22/18 0111

## 2018-01-21 NOTE — ED Notes (Signed)
Pt reports general body aches x 3 days, having night sweats and an episode yesterday at work feeling lightheaded as he was going to pass out. 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

## 2018-01-21 NOTE — ED Triage Notes (Addendum)
Joint pain, night sweats, fatigue since yesterday. Denies cough, N/V/D, rashes, tick exposure. Also endorses frequent urination

## 2018-01-22 ENCOUNTER — Emergency Department (HOSPITAL_BASED_OUTPATIENT_CLINIC_OR_DEPARTMENT_OTHER)
Admission: EM | Admit: 2018-01-22 | Discharge: 2018-01-22 | Disposition: A | Discharge status: Home / Self Care | Payer: BC Managed Care – PPO | Attending: Emergency Medicine | Admitting: Emergency Medicine

## 2018-01-22 ENCOUNTER — Encounter (HOSPITAL_BASED_OUTPATIENT_CLINIC_OR_DEPARTMENT_OTHER): Payer: Self-pay | Admitting: Emergency Medicine

## 2018-01-22 ENCOUNTER — Other Ambulatory Visit: Payer: Self-pay

## 2018-01-22 DIAGNOSIS — F17200 Nicotine dependence, unspecified, uncomplicated: Secondary | ICD-10-CM | POA: Insufficient documentation

## 2018-01-22 DIAGNOSIS — T8069XA Other serum reaction due to other serum, initial encounter: Secondary | ICD-10-CM | POA: Diagnosis present

## 2018-01-22 DIAGNOSIS — N3001 Acute cystitis with hematuria: Secondary | ICD-10-CM | POA: Diagnosis not present

## 2018-01-22 DIAGNOSIS — Z79899 Other long term (current) drug therapy: Secondary | ICD-10-CM | POA: Diagnosis not present

## 2018-01-22 DIAGNOSIS — N410 Acute prostatitis: Secondary | ICD-10-CM | POA: Diagnosis not present

## 2018-01-22 DIAGNOSIS — Y69 Unspecified misadventure during surgical and medical care: Secondary | ICD-10-CM | POA: Diagnosis not present

## 2018-01-22 DIAGNOSIS — T7840XA Allergy, unspecified, initial encounter: Secondary | ICD-10-CM

## 2018-01-22 LAB — BASIC METABOLIC PANEL
ANION GAP: 11 (ref 5–15)
BUN: 11 mg/dL (ref 6–20)
CO2: 23 mmol/L (ref 22–32)
Calcium: 9.1 mg/dL (ref 8.9–10.3)
Chloride: 101 mmol/L (ref 98–111)
Creatinine, Ser: 0.84 mg/dL (ref 0.61–1.24)
GFR calc Af Amer: >60 mL/min (ref >60)
GFR calc non Af Amer: >60 mL/min (ref >60)
GLUCOSE: 121 mg/dL — AB (ref 70–99)
POTASSIUM: 3.6 mmol/L (ref 3.5–5.1)
Sodium: 135 mmol/L (ref 135–145)

## 2018-01-22 LAB — CBC WITH DIFFERENTIAL/PLATELET
ABS IMMATURE GRANULOCYTES: 0.03 10*3/uL (ref 0.00–0.07)
Basophils Absolute: 0 10*3/uL (ref 0.0–0.1)
Basophils Relative: 0 %
Eosinophils Absolute: 0.4 10*3/uL (ref 0.0–0.5)
Eosinophils Relative: 5 %
HEMATOCRIT: 42.5 % (ref 39.0–52.0)
HEMOGLOBIN: 13.4 g/dL (ref 13.0–17.0)
Immature Granulocytes: 0 %
LYMPHS ABS: 0.5 10*3/uL — AB (ref 0.7–4.0)
LYMPHS PCT: 6 %
MCH: 28.8 pg (ref 26.0–34.0)
MCHC: 31.5 g/dL (ref 30.0–36.0)
MCV: 91.4 fL (ref 80.0–100.0)
MONO ABS: 0.5 10*3/uL (ref 0.1–1.0)
MONOS PCT: 6 %
NEUTROS ABS: 6.7 10*3/uL (ref 1.7–7.7)
NEUTROS PCT: 83 %
PLATELETS: 151 10*3/uL (ref 150–400)
RBC: 4.65 MIL/uL (ref 4.22–5.81)
RDW: 12.1 % (ref 11.5–15.5)
WBC: 8.1 10*3/uL (ref 4.0–10.5)
nRBC: 0 % (ref 0.0–0.2)

## 2018-01-22 LAB — URINALYSIS, ROUTINE W REFLEX MICROSCOPIC
Bilirubin Urine: NEGATIVE
Glucose, UA: NEGATIVE mg/dL
Ketones, ur: NEGATIVE mg/dL
Nitrite: NEGATIVE
PROTEIN: NEGATIVE mg/dL
pH: 6 (ref 5.0–8.0)

## 2018-01-22 LAB — URINALYSIS, MICROSCOPIC (REFLEX)

## 2018-01-22 LAB — OCCULT BLOOD X 1 CARD TO LAB, STOOL: Fecal Occult Bld: POSITIVE — AB

## 2018-01-22 LAB — I-STAT CG4 LACTIC ACID, ED: Lactic Acid, Venous: 1.29 mmol/L (ref 0.5–1.9)

## 2018-01-22 MED ORDER — ACETAMINOPHEN 500 MG PO TABS
1000.0000 mg | ORAL_TABLET | Freq: Once | ORAL | Status: AC
  Administered 2018-01-22: 1000 mg via ORAL
  Filled 2018-01-22: qty 2

## 2018-01-22 MED ORDER — KETOROLAC TROMETHAMINE 30 MG/ML IJ SOLN
30.0000 mg | Freq: Once | INTRAMUSCULAR | Status: AC
  Administered 2018-01-22: 30 mg via INTRAVENOUS
  Filled 2018-01-22: qty 1

## 2018-01-22 MED ORDER — DIPHENHYDRAMINE HCL 25 MG PO TABS: ORAL_TABLET | ORAL | 0 refills | Status: DC

## 2018-01-22 MED ORDER — SODIUM CHLORIDE 0.9 % IV BOLUS (SEPSIS)
1000.0000 mL | Freq: Once | INTRAVENOUS | Status: AC
  Administered 2018-01-22: 1000 mL via INTRAVENOUS

## 2018-01-22 MED ORDER — SODIUM CHLORIDE 0.9 % IV SOLN
1000.0000 mL | INTRAVENOUS | Status: DC
  Administered 2018-01-22: 1000 mL via INTRAVENOUS

## 2018-01-22 MED ORDER — SULFAMETHOXAZOLE-TRIMETHOPRIM 800-160 MG PO TABS: 1.0000 | ORAL_TABLET | Freq: Two times a day (BID) | ORAL | 0 refills | Status: AC

## 2018-01-22 MED ORDER — SODIUM CHLORIDE 0.9 % IV SOLN
1.0000 g | Freq: Once | INTRAVENOUS | Status: AC
  Administered 2018-01-22: 1 g via INTRAVENOUS
  Filled 2018-01-22: qty 10

## 2018-01-22 MED ORDER — SULFAMETHOXAZOLE-TRIMETHOPRIM 800-160 MG PO TABS
1.0000 | ORAL_TABLET | Freq: Once | ORAL | Status: AC
  Administered 2018-01-22: 1 via ORAL
  Filled 2018-01-22: qty 1

## 2018-01-22 MED ORDER — ONDANSETRON 4 MG PO TBDP: 4.0000 mg | ORAL_TABLET | ORAL | 0 refills | Status: DC | PRN

## 2018-01-22 MED ORDER — DIPHENHYDRAMINE HCL 50 MG/ML IJ SOLN
25.0000 mg | Freq: Once | INTRAMUSCULAR | Status: AC
  Administered 2018-01-22: 25 mg via INTRAVENOUS
  Filled 2018-01-22: qty 1

## 2018-01-22 NOTE — Discharge Instructions (Signed)
1.  Stop ciprofloxacin.  You appear to have had an allergic reaction to it.  Take Benadryl 25 to 50 mg every 6-8 hours for the next 1 to 2 days as needed hives. 2.  Start Bactrim as prescribed.  Continue your prescribed Keflex. 3.  Take ibuprofen for aches and pains and fever, take Zofran if needed for nausea or vomiting. 4.  Schedule a recheck with Dr. Mena Goes urologist.  Return to the emergency department if you are getting worse.

## 2018-01-22 NOTE — ED Triage Notes (Signed)
Reports to ER for rash.  States he believes this is related to the medication he was given for UTI last night.  Reports a burning sensation all over.

## 2018-01-22 NOTE — ED Provider Notes (Signed)
[facility] EMERGENCY DEPARTMENT Provider Note   CSN: [id number] Arrival date & time: 01/22/18  1452     History   Chief Complaint Chief Complaint  Patient presents with  . Allergic Reaction    HPI Isaiah Heath is a 37 y.o. male.  HPI Patient was seen by myself yesterday as per yesterday's note.  Findings were consistent with urinary source for infection.  Patient was given Rocephin and Zithromax in the emergency department.  Was prescribed Keflex and Cipro to take orally.  Patient reports that he did go to work this morning at about 615.  [place] and forth to different locations.  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  He reports by about noon he was getting really weak and having chills.  He called his wife to come and pick him up.  Once he got back home, she gave him his Keflex and ciprofloxacin at about 1 PM.  He reports within an hour he started noticing that he was itching around his legs and back.  He saw that he was developing hives.  He reports also it was hurting when he took a deep breath in and out.  There is a general achiness throughout the chest.  He denies shortness of breath or having developed cough since yesterday. Past Medical History:  Diagnosis Date  . Anxiety   . Constipation    Miralax helps at times  . Diverticulitis   . Ear infection   . Fall against object 05/04/2017   hit left side head on dresser/ no memory problems or changes in eye sight/ slight HA  . Inguinal hernia, left   . Intestinal obstruction (HCC)    several times    Patient Active Problem List   Diagnosis Date Noted  . 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 Metatarsalgia of left foot 07/13/2017  . Edema of left foot 07/13/2017  . Left foot pain 07/13/2017  . Constipation 04/07/2017  . LLQ pain 04/07/2017  . Anxiety 02/21/2017  . Excessive cerumen in ear canal, right 01/03/2017  . Acute  pain of right shoulder 12/29/2016  . Hematochezia 11/23/2016  . Healthcare maintenance 11/23/2016  . Nocturia 11/23/2016  . Pain of left middle finger 11/23/2016  . Dizziness 11/23/2016    Past Surgical History:  Procedure Laterality Date  . HERNIA REPAIR Left [place], [place] [facility] surgery, Dr. Nelda Marseille  . INGUINAL HERNIA REPAIR Left 03/03/2016   Procedure: LEFT INGUNIAL HERNIA EXPLORATION;  Surgeon: Jimmye Norman, MD;  [facility];  Service: General;  Laterality: Left;  . PROSTATE BIOPSY     ok        Home Medications    Prior to Admission medications   Medication Sig Start Date End Date Taking? Authorizing Provider  cephALEXin (KEFLEX) 500 MG capsule Take 2 capsules (1,000 mg total) by mouth 2 (two) times daily. 01/21/18   Arby Barrette, MD  ciprofloxacin (CIPRO) 500 MG tablet Take 1 tablet (500 mg total) by mouth 2 (two) times daily. One po bid x 7 days 01/21/18   Arby Barrette, MD  diphenhydrAMINE (BENADRYL) 25 MG tablet 1 to 2 tablets every 6 hours for hives as needed. 01/22/18   Arby Barrette, MD  HYDROcodone-acetaminophen (NORCO/VICODIN) 5-325 MG tablet Take 1-2 tablets by mouth every 6 (six) hours as needed for severe pain. Patient not taking: Reported on 12/21/2017 12/14/17   Mesner, Barbara Cower, MD  ibuprofen (ADVIL,MOTRIN) 200 MG tablet Take 400 mg by mouth every 6 (six) hours as needed.    [provider]  ibuprofen (ADVIL,MOTRIN) 800 MG tablet Take 1 tablet (800 mg total) by mouth 3 (three) times daily. 01/21/18   Arby Barrette, MD  Multiple Vitamin (MULTIVITAMIN) tablet Take 1 tablet by mouth daily. Natures Gummi MVI for men-Take one daily    [provider]  ondansetron (ZOFRAN ODT) 4 MG disintegrating tablet Take 1 tablet (4 mg total) by mouth every 4 (four) hours as needed for nausea or vomiting. 01/22/18   Arby Barrette, MD  sertraline (ZOLOFT) 20 MG/ML concentrated solution Take 20 mg by mouth daily.    [provider]    sulfamethoxazole-trimethoprim (BACTRIM DS,SEPTRA DS) 800-160 MG tablet Take 1 tablet by mouth 2 (two) times daily for 14 days. 01/22/18 02/05/18  Arby Barrette, MD    Family History Family History  Problem Relation Age of Onset  . Diabetes Mother   . Hypertension Mother   . Stroke Father   . Hypertension Father   . Diabetes Maternal Grandmother   . Hypertension Maternal Grandfather   . Hypertension Paternal Grandfather     Social History Social History   Tobacco Use  . Smoking status: Current Some Day Smoker    Packs/day: 0.25    Years: 10.00    Pack years: 2.50  . Smokeless tobacco: Never Used  Substance Use Topics  . Alcohol use: No    Frequency: Never  . Drug use: No     Allergies   Ciprofloxacin and No known allergies   Review of Systems Review of Systems 10 Systems reviewed and are negative for acute change except as noted in the HPI.  Physical Exam Updated Vital Signs BP 133/83   Pulse 87   Temp 98.6 F (37 C) (Oral)   Resp 20   Ht 6\' 2"  (1.88 m)   Wt 68.9 kg   SpO2 99%   BMI 19.52 kg/m   Physical Exam  Constitutional: He is oriented to person, place, and time.  Patient appearance is similar to yesterday.  He is mildly/moderately ill and uncomfortable in appearance but nontoxic with alert mental status and no respiratory distress.  HENT:  Head: Normocephalic and atraumatic.  Mouth/Throat: Oropharynx is clear and moist.  No Oropharyngeal swelling  Eyes: Pupils are equal, round, and reactive to light. EOM are normal.  Neck:  Is supple without lymphadenopathy.  Patient does have a patch of urticaria on the upper neck behind the right ear.  Cardiovascular:  borderline tachycardia.  No rub murmur gallop.  Pulmonary/Chest: Effort normal and breath sounds normal.  Abdominal: Soft.  His lower abdomen mild to moderately tender.  He reports increased sense of urinary urgency with pressure.  Genitourinary:  Genitourinary Comments: Penis Normal.  No  scrotal or testicular swelling.  Testicles are nontender.  No visible rashes.  Bilateral small\shotty inguinal lymphadenopathy.  Exam: Prostate slightly enlarged, tender to the upper poles.  Musculoskeletal: Normal range of motion. He exhibits no edema or tenderness.  Peripheral edema, calves are soft and nontender, no joint effusions  Neurological: He is alert and oriented to person, place, and time. No cranial nerve deficit. He exhibits normal muscle tone. Coordination normal.  Skin: Skin is warm and dry. Rash noted.  Patient has sized urticaria over the hips and upper legs. Patch of urticaria of a couple centimeters on the lateral neck. No full body erythematous rash  Psychiatric: He has a normal mood and  affect.     ED Treatments / Results  Labs (all labs ordered are listed, but only abnormal results are displayed) Labs Reviewed  URINALYSIS, ROUTINE W REFLEX MICROSCOPIC - Abnormal; Notable for the following components:      Result Value   Specific Gravity, Urine <1.005 (*)    Hgb urine dipstick TRACE (*)    Leukocytes, UA TRACE (*)    All other components within normal limits  BASIC METABOLIC PANEL - Abnormal; Notable for the following components:   Glucose, Bld 121 (*)    All other components within normal limits  CBC WITH DIFFERENTIAL/PLATELET - Abnormal; Notable for the following components:   Lymphs Abs 0.5 (*)    All other components within normal limits  OCCULT BLOOD X 1 CARD TO LAB, STOOL - Abnormal; Notable for the following components:   Fecal Occult Bld POSITIVE (*)    All other components within normal limits  URINALYSIS, MICROSCOPIC (REFLEX) - Abnormal; Notable for the following components:   Bacteria, UA RARE (*)    All other components within normal limits  CULTURE, BLOOD (ROUTINE X 2)  CULTURE, BLOOD (ROUTINE X 2)  I-STAT CG4 LACTIC ACID, ED  I-STAT CG4 LACTIC ACID, ED  GC/CHLAMYDIA PROBE AMP ([place]) NOT AT [facility]    EKG None  Radiology Ct Renal Stone  Study  Result Date: 01/21/2018 CLINICAL DATA:  Generalized body aches for 3 days. Night sweats. Flank pain. Stone disease suspected. EXAM: CT ABDOMEN AND PELVIS WITHOUT CONTRAST TECHNIQUE: Multidetector CT imaging of the abdomen and pelvis was performed following the standard protocol without IV contrast. COMPARISON:  04/19/2017 and 08/10/2016. FINDINGS: Lower chest: 3 mm nodule in the left lower lobe is unchanged from previous exam. Hepatobiliary: No focal liver abnormality is seen. No gallstones, gallbladder wall thickening, or biliary dilatation. Pancreas: Unremarkable. No pancreatic ductal dilatation or surrounding inflammatory changes. Spleen: Normal in size without focal abnormality. Adrenals/Urinary Tract: The adrenal glands appear normal. No hydronephrosis identified bilaterally. No kidney stones. No ureteral calculi or bladder calculi identified. Stomach/Bowel: No pathologic dilatation of the small bowel loops. A moderate to large stool burden identified within the colon. No pathologic dilatation of the colon. The appendix is not confidently identified from the adjacent unopacified bowel loops. Vascular/Lymphatic: Mild aortic atherosclerosis. No aneurysm identified. No abdominopelvic adenopathy. Reproductive: Prostate is unremarkable. Other: No abdominal wall hernia or abnormality. No abdominopelvic ascites. Musculoskeletal: Degenerative disc disease identified within the lumbar spine at the L5-S1 level. IMPRESSION: 1. No acute findings identified within the abdomen or pelvis. No kidney stones or hydronephrosis. 2. Nonvisualization of the appendix. 3. Unchanged 2 mm nodule within the left lower lobe compared with 08/10/2016, compatible with a benign abnormality. Electronically Signed   By: Signa Kell M.D.   On: 01/21/2018 21:33    Procedures Procedures (including critical care time)  Medications Ordered in ED Medications  sodium chloride 0.9 % bolus 1,000 mL (0 mLs Intravenous Stopped 01/22/18  1628)    Followed by  0.9 %  sodium chloride infusion ( Intravenous Rate/Dose Verify 01/22/18 1745)  ketorolac (TORADOL) 30 MG/ML injection 30 mg (has no administration in time range)  acetaminophen (TYLENOL) tablet 1,000 mg (1,000 mg Oral Given 01/22/18 1601)  diphenhydrAMINE (BENADRYL) injection 25 mg (25 mg Intravenous Given 01/22/18 1559)  cefTRIAXone (ROCEPHIN) 1 g in sodium chloride 0.9 % 100 mL IVPB ( Intravenous Stopped 01/22/18 1636)  sulfamethoxazole-trimethoprim (BACTRIM DS,SEPTRA DS) 800-160 MG per tablet 1 tablet (1 tablet Oral Given 01/22/18 1749)  Initial Impression / Assessment and Plan / ED Course  I have reviewed the triage vital signs and the nursing notes.  Pertinent labs & imaging results that were available during my care of the patient were reviewed by me and considered in my medical decision making (see chart for details).  Clinical Course as of Jan 23 1839  Wynelle Link Jan 22, 2018  1610 Discussed with Dr. Mena Goes.  At this point, as patient has had adverse reaction to Cipro, changed to Bactrim.  As the patient's vital signs and diagnostic studies are improving, continue outpatient management and follow-up with urology.   [MP]    Clinical Course User Index [MP] Arby Barrette, MD   Appears to have been getting worsening symptoms going into work today.  Was discharged from the hospital last night around midnight.  Patient is probably very fatigued in addition to being acutely ill.  He was having chills.  Once home, he took his first dose of ciprofloxacin and Keflex.  Then about an hour, he developed hives.  At this time, I suspect the Cipro as the source of the hives.  Patient tolerated Rocephin without reaction.  He has been given a second IV dose of Rocephin in the emergency department today.  Hives resolved with administration of Benadryl.  Despite feeling worse this morning, patient's labs are all showing improvement and vital signs are stable.  I have reviewed the case  with Dr. Mena Goes and he does suggest using Bactrim to cover prostatitis at this time.  Continue the patient on the Keflex plus Bactrim with follow-up with urology.  She is advised he should take 2 days off work to rest and start recovery.  Final Clinical Impressions(s) / ED Diagnoses   Final diagnoses:  Allergic reaction to drug, initial encounter  Acute cystitis with hematuria  Acute prostatitis    ED Discharge Orders         Ordered    sulfamethoxazole-trimethoprim (BACTRIM DS,SEPTRA DS) 800-160 MG tablet  2 times daily     01/22/18 1836    ondansetron (ZOFRAN ODT) 4 MG disintegrating tablet  Every 4 hours PRN     01/22/18 1836    diphenhydrAMINE (BENADRYL) 25 MG tablet     01/22/18 Donnetta Simpers, MD 01/22/18 1842

## 2018-01-23 LAB — GC/CHLAMYDIA PROBE AMP (~~LOC~~) NOT AT ARMC
CHLAMYDIA, DNA PROBE: NEGATIVE
Chlamydia: NEGATIVE
Neisseria Gonorrhea: NEGATIVE
Neisseria Gonorrhea: NEGATIVE

## 2018-01-23 LAB — HIV ANTIBODY (ROUTINE TESTING W REFLEX): HIV SCREEN 4TH GENERATION: NONREACTIVE

## 2018-01-23 LAB — URINE CULTURE: CULTURE: NO GROWTH

## 2018-01-23 LAB — RPR: RPR Ser Ql: NONREACTIVE

## 2018-01-27 LAB — CULTURE, BLOOD (ROUTINE X 2)
CULTURE: NO GROWTH
Culture: NO GROWTH
SPECIAL REQUESTS: ADEQUATE
SPECIAL REQUESTS: ADEQUATE

## 2018-01-30 ENCOUNTER — Other Ambulatory Visit: Payer: Self-pay | Admitting: Adult Health

## 2018-02-06 ENCOUNTER — Ambulatory Visit: Payer: BC Managed Care – PPO | Admitting: Gastroenterology

## 2018-02-06 ENCOUNTER — Encounter

## 2018-04-18 NOTE — Progress Notes (Deleted)
Subjective:    Patient ID: Isaiah Heath, male    DOB: 07/22/1980, 38 y.o.   MRN: [id number]  HPI:  Isaiah Heath presents    Patient Care Team    Relationship Specialty Notifications Start End  Julaine Fusianford, Lasya Vetter D, NP PCP - General Family Medicine  12/29/16     Patient Active Problem List   Diagnosis Date Noted  . 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 Metatarsalgia of left foot 07/13/2017  . Edema of left foot 07/13/2017  . Left foot pain 07/13/2017  . Constipation 04/07/2017  . LLQ pain 04/07/2017  . Anxiety 02/21/2017  . Excessive cerumen in ear canal, right 01/03/2017  . Acute pain of right shoulder 12/29/2016  . Hematochezia 11/23/2016  . Healthcare maintenance 11/23/2016  . Nocturia 11/23/2016  . Pain of left middle finger 11/23/2016  . Dizziness 11/23/2016     Past Medical History:  Diagnosis Date  . Anxiety   . Constipation    Miralax helps at times  . Diverticulitis   . Ear infection   . Fall against object 05/04/2017   hit left side head on dresser/ no memory problems or changes in eye sight/ slight HA  . Inguinal hernia, left   . Intestinal obstruction (HCC)    several times     Past Surgical History:  Procedure Laterality Date  . HERNIA REPAIR Left [place]2010   [place] [place] [facility], Dr. Nelda Marseilleiblet  . INGUINAL HERNIA REPAIR Left 03/03/2016   Procedure: LEFT INGUNIAL HERNIA EXPLORATION;  Surgeon: Jimmye NormanJames Wyatt, MD;  [facility];  Service: General;  Laterality: Left;  . PROSTATE BIOPSY     ok     Family History  Problem Relation Age of Onset  . Diabetes Mother   . Hypertension Mother   . Stroke Father   . Hypertension Father   . Diabetes Maternal Grandmother   . Hypertension Maternal Grandfather   . Hypertension Paternal Grandfather      Social History   Substance and Sexual Activity  Drug Use No     Social History   Substance and Sexual Activity  Alcohol Use No  . Frequency: Never     Social History   Tobacco  Use  Smoking Status Current Some Day Smoker  . Packs/day: 0.25  . Years: 10.00  . Pack years: 2.50  Smokeless Tobacco Never Used     Outpatient Encounter Medications as of 04/19/2018  Medication Sig  . cephALEXin (KEFLEX) 500 MG capsule Take 2 capsules (1,000 mg total) by mouth 2 (two) times daily.  . ciprofloxacin (CIPRO) 500 MG tablet Take 1 tablet (500 mg total) by mouth 2 (two) times daily. One po bid x 7 days  . diphenhydrAMINE (BENADRYL) 25 MG tablet 1 to 2 tablets every 6 hours for hives as needed.  Marland Kitchen. HYDROcodone-acetaminophen (NORCO/VICODIN) 5-325 MG tablet Take 1-2 tablets by mouth every 6 (six) hours as needed for severe pain. (Patient not taking: Reported on 12/21/2017)  . ibuprofen (ADVIL,MOTRIN) 200 MG tablet Take 400 mg by mouth every 6 (six) hours as needed.  Marland Kitchen. ibuprofen (ADVIL,MOTRIN) 800 MG tablet Take 1 tablet (800 mg total) by mouth 3 (three) times daily.  . Multiple Vitamin (MULTIVITAMIN) tablet Take 1 tablet by mouth daily. Natures Gummi MVI for men-Take one daily  . ondansetron (ZOFRAN ODT) 4 MG disintegrating tablet Take 1 tablet (4 mg total) by mouth every 4 (four) hours as needed for nausea or vomiting.  . sertraline (ZOLOFT) 20 MG/ML concentrated solution Take  20 mg by mouth daily.   Facility-Administered Encounter Medications as of 04/19/2018  Medication  . 0.9 %  sodium chloride infusion    Allergies: Ciprofloxacin and No known allergies  There is no height or weight on file to calculate BMI.  There were no vitals taken for this visit.     Review of Systems     Objective:   Physical Exam        Assessment & Plan:  No diagnosis found.  No problem-specific Assessment & Plan notes found for this encounter.    FOLLOW-UP:  No follow-ups on file.

## 2018-04-19 ENCOUNTER — Ambulatory Visit: Payer: BLUE CROSS/BLUE SHIELD | Admitting: Adult Health

## 2018-04-28 DIAGNOSIS — M79641 Pain in right hand: Secondary | ICD-10-CM | POA: Diagnosis not present

## 2018-05-01 DIAGNOSIS — M79641 Pain in right hand: Secondary | ICD-10-CM | POA: Diagnosis not present

## 2018-05-05 ENCOUNTER — Other Ambulatory Visit: Payer: Self-pay

## 2018-05-05 ENCOUNTER — Encounter (HOSPITAL_BASED_OUTPATIENT_CLINIC_OR_DEPARTMENT_OTHER): Payer: Self-pay | Admitting: *Deleted

## 2018-05-09 NOTE — H&P (Signed)
[facility]  [address]   [address] [name] [address] [phone]   A Division of [facility]   RE: Isaiah, Heath   [id number]      DOB: June 12, 1980  INITIAL EVALUATION:  04-28-2018  REASON FOR VISIT: Right dorsal hand swelling.   HPI:   He is 38 years old. He noted on 04-05-18 a significant swelling over the middle of his hand, near the fourth metacarpal extensor tendon.  No systemic symptoms. No injury.  EXAMINATION: Well appearing male in no apparent distress. He has what appears to be a palpable ganglion cyst over the extensor tendon of the fourth digit. Neurovascularly intact. No erythema. No wound.   IMAGES: X-rays reviewed by me:   X-rays show no osseous abnormality.   ASSESSMENT/PLAN: I would like to get an MRI to confirm this is a ganglion cyst. I will then set him up for a surgical excision. I discussed aspiration and he would like to go forward with the excision.   Jewel Baize.  Eulah Pont, M.D.  Electronically verified by Jewel Baize. Eulah Pont, M.D. TDM:: jgc D:  04-28-18 T:   05-02-18 cc: William Hamburger, NP   [phone]

## 2018-05-12 ENCOUNTER — Ambulatory Visit (HOSPITAL_BASED_OUTPATIENT_CLINIC_OR_DEPARTMENT_OTHER)
Admission: RE | Admit: 2018-05-12 | Payer: BLUE CROSS/BLUE SHIELD | Source: Home / Self Care | Admitting: Orthopedic Surgery

## 2018-05-12 DIAGNOSIS — M79641 Pain in right hand: Secondary | ICD-10-CM | POA: Diagnosis not present

## 2018-05-12 SURGERY — EXCISION, GANGLION CYST, WRIST
Anesthesia: Choice | Laterality: Right

## 2018-06-02 DIAGNOSIS — J029 Acute pharyngitis, unspecified: Secondary | ICD-10-CM | POA: Diagnosis not present

## 2018-06-11 ENCOUNTER — Emergency Department (HOSPITAL_BASED_OUTPATIENT_CLINIC_OR_DEPARTMENT_OTHER)
Admission: EM | Admit: 2018-06-11 | Discharge: 2018-06-11 | Disposition: A | Discharge status: Home / Self Care | Payer: BLUE CROSS/BLUE SHIELD | Attending: Emergency Medicine | Admitting: Emergency Medicine

## 2018-06-11 ENCOUNTER — Encounter (HOSPITAL_BASED_OUTPATIENT_CLINIC_OR_DEPARTMENT_OTHER): Payer: Self-pay | Admitting: *Deleted

## 2018-06-11 ENCOUNTER — Other Ambulatory Visit: Payer: Self-pay

## 2018-06-11 DIAGNOSIS — Z79899 Other long term (current) drug therapy: Secondary | ICD-10-CM | POA: Insufficient documentation

## 2018-06-11 DIAGNOSIS — F17210 Nicotine dependence, cigarettes, uncomplicated: Secondary | ICD-10-CM | POA: Insufficient documentation

## 2018-06-11 DIAGNOSIS — J111 Influenza due to unidentified influenza virus with other respiratory manifestations: Secondary | ICD-10-CM | POA: Diagnosis not present

## 2018-06-11 DIAGNOSIS — R69 Illness, unspecified: Secondary | ICD-10-CM

## 2018-06-11 DIAGNOSIS — R509 Fever, unspecified: Secondary | ICD-10-CM | POA: Diagnosis present

## 2018-06-11 MED ORDER — OSELTAMIVIR PHOSPHATE 75 MG PO CAPS: 75.0000 mg | ORAL_CAPSULE | Freq: Two times a day (BID) | ORAL | 0 refills | Status: DC

## 2018-06-11 MED ORDER — ACETAMINOPHEN 325 MG PO TABS
650.0000 mg | ORAL_TABLET | Freq: Once | ORAL | Status: AC | PRN
  Administered 2018-06-11: 650 mg via ORAL
  Filled 2018-06-11: qty 2

## 2018-06-11 NOTE — Discharge Instructions (Addendum)
Today your symptoms are consistent with a flu or a flulike illness.  As I did not test you for the flu we call this a flulike illness.  I have given you information to read on the flu as most of this still applies.  If you have not already, please consider getting a flu shot once you are well.  Please make sure to practice good hand hygiene to help prevent the spread of flu.  We discussed today that many symptoms of the flu such as fever, not feeling well, and body aches can also be the first signs of more serious illnesses or infections.  If you have any new or worsening symptoms please seek additional medical care and evaluation.    As we discussed today I am not able to test you for coronavirus.  Therefore it is important that you quarantine yourself at home for 14 days.  I would recommend taking Tylenol for your fever and symptoms.  If Tylenol does not control your symptoms then you may start adding in ibuprofen at a low dose as possible.  Please make sure that you stay well-hydrated.  If you do need to seek additional medical care please call ahead to let them know that you have been told to quarantine at home by a healthcare provider.  Please take Ibuprofen (Advil, motrin) and Tylenol (acetaminophen) to relieve your pain.  You may take up to 600 MG (3 pills) of normal strength ibuprofen every 8 hours as needed.  In between doses of ibuprofen you make take tylenol, up to 1,000 mg (two extra strength pills).  Do not take more than 3,000 mg tylenol in a 24 hour period.  Please check all medication labels as many medications such as pain and cold medications may contain tylenol.  Do not drink alcohol while taking these medications.  Do not take other NSAID'S while taking ibuprofen (such as aleve or naproxen).  Please take ibuprofen with food to decrease stomach upset.

## 2018-06-11 NOTE — ED Provider Notes (Signed)
[facility] EMERGENCY DEPARTMENT Provider Note   CSN: [id number] Arrival date & time: 06/11/18  1720    History   Chief Complaint Chief Complaint  Patient presents with   Fever   Generalized Body Aches    HPI Isaiah Heath is a 38 y.o. male with a past medical history of anxiety, diverticulitis, who presents today for evaluation of fever, body aches, and fatigue.  He reports that he woke up this morning with significant joint pain, worse in his knees however says his entire body hurts.  He denies any shortness of breath or chest pain.  He reports an occasional cough however is unchanged from normal.  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  He states he did not get a flu shot this year.  He has not tried any ibuprofen or Tylenol today.  He denies any specific abdominal pain, sore throat, or urinary symptoms.    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 Nelda Marseille   INGUINAL HERNIA REPAIR Left 03/03/2016   Procedure: LEFT INGUNIAL HERNIA EXPLORATION;  Surgeon: Jimmye Norman, MD;  Location: [facility] OR;  Service: General;  Laterality: Left;   PROSTATE BIOPSY     ok         Home Medications    Prior to Admission medications   Medication Sig Start Date End Date Taking? Authorizing Provider  ibuprofen (ADVIL,MOTRIN) 200 MG tablet Take 400 mg by mouth every 6 (six) hours as needed.    [provider]  Multiple Vitamin (MULTIVITAMIN) tablet Take 1 tablet by mouth daily. Natures Gummi MVI for men-Take one daily    [provider]  oseltamivir (TAMIFLU) 75 MG capsule Take 1 capsule (75 mg total) by mouth every 12 (twelve) hours. 06/11/18   Cristina Gong, PA-C    Family History Family History  Problem Relation Age of Onset   Diabetes Mother    Hypertension Mother    Stroke Father    Hypertension Father    Diabetes Maternal Grandmother    Hypertension Maternal Grandfather    Hypertension Paternal Grandfather     Social History Social History   Tobacco Use   Smoking status: Current Some Day Smoker    Packs/day: 0.25    Years: 10.00    Pack years: 2.50   Smokeless tobacco: Never Used  Substance Use Topics   Alcohol use: No    Frequency: Never   Drug use: No     Allergies   Ciprofloxacin   Review of Systems Review of Systems  Constitutional: Positive for chills, fatigue and fever.  HENT: Positive for ear pain, sinus pain and  sore throat. Negative for congestion.   Eyes: Negative for visual disturbance.  Respiratory: Negative for cough, chest tightness and shortness of breath.   Cardiovascular: Negative for chest pain.  Gastrointestinal: Negative for abdominal pain, diarrhea, nausea and vomiting.  Genitourinary: Negative for dysuria.  Musculoskeletal: Positive for arthralgias, back pain and myalgias. Negative for neck pain and neck stiffness.  Skin: Negative for rash.  Neurological: Negative for headaches.  All other systems reviewed and are negative.    Physical Exam Updated Vital Signs BP 123/79 (BP Location: Right Arm)    Pulse (!) 101    Temp (!) 102.1 F (38.9 C) (Oral)    Resp 20    Ht  6\' 2"  (1.88 m)    Wt 69.4 kg    SpO2 100%    BMI 19.64 kg/m   Physical Exam Vitals signs and nursing note reviewed.  Constitutional:      General: He is not in acute distress.    Appearance: He is well-developed. He is not toxic-appearing.  HENT:     Head: Normocephalic and atraumatic.     Right Ear: External ear normal. There is impacted cerumen.     Left Ear: External ear normal. There is impacted cerumen.     Nose: Nose normal.     Mouth/Throat:     Mouth: Mucous membranes are moist.     Pharynx: No oropharyngeal exudate or posterior oropharyngeal erythema.  Eyes:     Extraocular Movements: Extraocular movements intact.     Conjunctiva/sclera: Conjunctivae normal.     Pupils: Pupils are equal, round, and reactive to light.  Neck:     Musculoskeletal: Normal range of motion and neck supple. No neck rigidity or muscular tenderness.  Cardiovascular:     Rate and Rhythm: Normal rate and regular rhythm.     Pulses: Normal pulses.     Heart sounds: Normal heart sounds. No murmur.  Pulmonary:     Effort: Pulmonary effort is normal. No respiratory distress.     Breath sounds: Normal breath sounds. No stridor. No rhonchi.  Abdominal:     General: Abdomen is flat. There is no distension.     Palpations: Abdomen is soft.     Tenderness: There is no abdominal tenderness.     Hernia: No hernia is present.  Musculoskeletal: Normal range of motion.     Right lower leg: No edema.     Left lower leg: No edema.  Skin:    General: Skin is warm and dry.  Neurological:     General: No focal deficit present.     Mental Status: He is alert and oriented to person, place, and time.  Psychiatric:        Mood and Affect: Mood normal.        Behavior: Behavior normal.      ED Treatments / Results  Labs (all labs ordered are listed, but only abnormal results are displayed) Labs Reviewed - No data to display  EKG None  Radiology No results found.  Procedures Procedures (including  critical care time)  Medications Ordered in ED Medications  acetaminophen (TYLENOL) tablet 650 mg (has no administration in time range)     Initial Impression / Assessment and Plan / ED Course  I have reviewed the triage vital signs and the nursing notes.  Pertinent labs & imaging results that were available during my care of the patient were reviewed by me and considered in my medical decision making (see chart for details).  Patient with symptoms consistent with influenza.  Vitals are stable, low-grade fever.  No signs of dehydration, tolerating PO's.  Lungs are clear. Due to patient's presentation and physical exam a chest x-ray was not ordered bc likely diagnosis of flu.  Discussed the cost versus benefit of Tamiflu treatment with the patient.  Discussed risks and side effects including no improvement, nausea, vomiting, diarrhea, headaches in addition to other symptoms.  Patient elects for Tamiflu, he is within the first 12 hours of his illness so this was prescribed.  Instructed to use over-the-counter antipyretics, specifically Tylenol and can add in ibuprofen if needed.  While he is febrile with occasional cough, he is not sick enough to require admission at this time and therefore does not meet current criteria for coronavirus 19 testing.  According to current guidelines he will be instructed to self quarantine at home for 14 days.  He states his understanding of these instructions.    Return precautions were discussed with patient who states their understanding.  At the time of discharge patient denied any unaddressed complaints or concerns.  Patient is agreeable for discharge home.   Final Clinical Impressions(s) / ED Diagnoses   Final diagnoses:  Influenza-like illness    ED Discharge Orders         Ordered    oseltamivir (TAMIFLU) 75 MG capsule  Every 12 hours     06/11/18 1757           Norman Clay 06/11/18 1805    Maia Plan, MD 06/11/18  1920

## 2018-06-11 NOTE — ED Notes (Signed)
ED Provider at bedside. 

## 2018-06-11 NOTE — ED Triage Notes (Signed)
Pt reports sore throat last week. [facility]. This morning he woke up with body aches, fever, fatigue. Denies SOB, chest pain. Reports occasional cough. Pt has not had a flu shot. 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

## 2018-06-29 ENCOUNTER — Ambulatory Visit (INDEPENDENT_AMBULATORY_CARE_PROVIDER_SITE_OTHER): Payer: BLUE CROSS/BLUE SHIELD | Admitting: Adult Health

## 2018-06-29 ENCOUNTER — Other Ambulatory Visit: Payer: Self-pay

## 2018-06-29 ENCOUNTER — Encounter: Payer: Self-pay | Admitting: Adult Health

## 2018-06-29 VITALS — BP 118/67 | HR 88 | Temp 99.2°F | Ht 74.0 in | Wt 152.3 lb

## 2018-06-29 DIAGNOSIS — R319 Hematuria, unspecified: Secondary | ICD-10-CM | POA: Diagnosis not present

## 2018-06-29 DIAGNOSIS — R1032 Left lower quadrant pain: Secondary | ICD-10-CM | POA: Diagnosis not present

## 2018-06-29 DIAGNOSIS — R8290 Unspecified abnormal findings in urine: Secondary | ICD-10-CM | POA: Diagnosis not present

## 2018-06-29 DIAGNOSIS — K4091 Unilateral inguinal hernia, without obstruction or gangrene, recurrent: Secondary | ICD-10-CM | POA: Insufficient documentation

## 2018-06-29 LAB — POCT URINALYSIS DIPSTICK
Glucose, UA: NEGATIVE
Ketones, UA: NEGATIVE
Nitrite, UA: NEGATIVE
Protein, UA: POSITIVE — AB
Spec Grav, UA: >=1.03 — AB (ref 1.010–1.025)
Urobilinogen, UA: 0.2 E.U./dL
pH, UA: 5.5 (ref 5.0–8.0)

## 2018-06-29 MED ORDER — AMOXICILLIN-POT CLAVULANATE 875-125 MG PO TABS: 1.0000 | ORAL_TABLET | Freq: Two times a day (BID) | ORAL | 0 refills | Status: DC

## 2018-06-29 MED ORDER — HYDROCODONE-ACETAMINOPHEN 5-325 MG PO TABS: 1.0000 | ORAL_TABLET | Freq: Four times a day (QID) | ORAL | 0 refills | Status: AC | PRN

## 2018-06-29 NOTE — Patient Instructions (Signed)
Hematuria, Adult Hematuria is blood in the urine. Blood may be visible in the urine, or it may be identified with a test. This condition can be caused by infections of the bladder, urethra, kidney, or prostate. Other possible causes include:  Kidney stones.  Cancer of the urinary tract.  Too much calcium in the urine.  Conditions that are passed from parent to child (inherited conditions).  Exercise that requires a lot of energy. Infections can usually be treated with medicine, and a kidney stone usually will pass through your urine. If neither of these is the cause of your hematuria, more tests may be needed to identify the cause of your symptoms. It is very important to tell your health care provider about any blood in your urine, even if it is painless or the blood stops without treatment. Blood in the urine, when it happens and then stops and then happens again, can be a symptom of a very serious condition, including cancer. There is no pain in the initial stages of many urinary cancers. Follow these instructions at home: Medicines  Take over-the-counter and prescription medicines only as told by your health care provider.  If you were prescribed an antibiotic medicine, take it as told by your health care provider. Do not stop taking the antibiotic even if you start to feel better. Eating and drinking  Drink enough fluid to keep your urine clear or pale yellow. It is recommended that you drink 3-4 quarts (2.8-3.8 L) a day. If you have been diagnosed with an infection, it is recommended that you drink cranberry juice in addition to large amounts of water.  Avoid caffeine, tea, and carbonated beverages. These tend to irritate the bladder.  Avoid alcohol because it may irritate the prostate (men). General instructions  If you have been diagnosed with a kidney stone, follow your health care provider's instructions about straining your urine to catch the stone.  Empty your bladder  often. Avoid holding urine for long periods of time.  If you are male: ? After a bowel movement, wipe from front to back and use each piece of toilet paper only once. ? Empty your bladder before and after sex.  Pay attention to any changes in your symptoms. Tell your health care provider about any changes or any new symptoms.  It is your responsibility to get your test results. Ask your health care provider, or the department performing the test, when your results will be ready.  Keep all follow-up visits as told by your health care provider. This is important. Contact a health care provider if:  You develop back pain.  You have a fever.  You have nausea or vomiting.  Your symptoms do not improve after 3 days.  Your symptoms get worse. Get help right away if:  You develop severe vomiting and are unable take medicine without vomiting.  You develop severe pain in your back or abdomen even though you are taking medicine.  You pass a large amount of blood in your urine.  You pass blood clots in your urine.  You feel very weak or like you might faint.  You faint. Summary  Hematuria is blood in the urine. It has many possible causes.  It is very important that you tell your health care provider about any blood in your urine, even if it is painless or the blood stops without treatment.  Take over-the-counter and prescription medicines only as told by your health care provider.  Drink enough fluid to keep   your urine clear or pale yellow. This information is not intended to replace advice given to you by your health care provider. Make sure you discuss any questions you have with your health care provider. Document Released: 03/08/2005 Document Revised: 04/10/2016 Document Reviewed: 04/10/2016 Elsevier Interactive Patient Education  2019 [employer].  1) Your Urinalysis- Blood and Leukocytes detected- will start on antibiotic We will send specimen for urine  culture/sensitivity and call you with results. Continue to drink plenty of water. Referral to Urologist placed (this is the second time you have had blood in your urine since Nov 2019). 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 Use OTC Acetaminophen and Ibuprofen for daytime pain control. Use goof body mechanics when lifting. We will call you when imaging results are available. If you develop severe pain, or have a bulge that you can not "push back in", please seek immediate medical care. FEEL BETTER!

## 2018-06-29 NOTE — Assessment & Plan Note (Signed)
Bil inguinal hernia repair as an infant. L inguinal hernia repair 2011, mess repair 2017 LLQ pain started 2 days ago Area TTP, no reducible mass appreciated.  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 Use OTC Acetaminophen and Ibuprofen for daytime pain control. Use goof body mechanics when lifting. We will call you when imaging results are available. If you develop severe pain, or have a bulge that you can not "push back in", please seek immediate medical care.

## 2018-06-29 NOTE — Progress Notes (Signed)
Subjective:    Patient ID: Isaiah Heath, male    DOB: [birth year], 38 y.o.   MRN: [id number]  HPI:  Mr. Urton presents with several complaints: 1) He reports hematuria and dysuria that started 48 hrs ago. He reports L flank pain- constant ache, rated 10/10 He denies frequency, penile discharge or new sexual partners. He denies fever/N/V/D He denies constipation/poor appetite  Current wt 152, last wt 141 06/2017- wt gain of 11 lbs! He was treated for UTI 01/22/2018 in local ED with combo Keflex and Cipro  2) LLQ pain that started 48 hrs ago. Pain is constant, will fluctuate between sharp and a dull ache, rated 10/10 He has extensive hernia hx- Bil inguinal hernia repair as an infant. L inguinal hernia repair 2011, mess repair 2017 He has been using OTC Acetaminophen and Ibuprofen for pain control with only minimal sx control  Reviewed all recent abdominal/thoracic imaging in last 18 months- 07/2016 CT Abd Pelvis W- Mild colonic diverticulosis  03/2017 [place] Abd Complete IMPRESSION: Study within normal limits.  He has long-standing hx of LLQ abdominal pain He denies constipation/hematochezia He denies fever/chills/N/V/D, but did report some night sweats last night  Patient Care Team    Relationship Specialty Notifications Start End  Julaine Fusi, NP PCP - General Family Medicine  12/29/16     Patient Active Problem List   Diagnosis Date Noted  . Hematuria 06/29/2018  . Abnormal urinalysis 06/29/2018  . Unilateral recurrent inguinal hernia without obstruction or gangrene 06/29/2018  . 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 Metatarsalgia of left foot 07/13/2017  . Edema of left foot 07/13/2017  . Left foot pain 07/13/2017  . Constipation 04/07/2017  . LLQ pain 04/07/2017  . Anxiety 02/21/2017  . Excessive cerumen in ear canal, right 01/03/2017  . Acute pain of right shoulder 12/29/2016  . Hematochezia 11/23/2016  . Healthcare maintenance 11/23/2016  . Nocturia  11/23/2016  . Pain of left middle finger 11/23/2016  . Dizziness 11/23/2016     Past Medical History:  Diagnosis Date  . Anxiety   . Diverticulitis   . Inguinal hernia, left      Past Surgical History:  Procedure Laterality Date  . HERNIA REPAIR Left [place], [place] [facility] surgery, Dr. Nelda Marseille  . INGUINAL HERNIA REPAIR Left 03/03/2016   Procedure: LEFT INGUNIAL HERNIA EXPLORATION;  Surgeon: Jimmye Norman, MD;  Location: [facility] OR;  Service: General;  Laterality: Left;  . PROSTATE BIOPSY     ok     Family History  Problem Relation Age of Onset  . Diabetes Mother   . Hypertension Mother   . Stroke Father   . Hypertension Father   . Diabetes Maternal Grandmother   . Hypertension Maternal Grandfather   . Hypertension Paternal Grandfather      Social History   Substance and Sexual Activity  Drug Use No     Social History   Substance and Sexual Activity  Alcohol Use No  . Frequency: Never     Social History   Tobacco Use  Smoking Status Current Some Day Smoker  . Packs/day: 0.25  . Years: 10.00  . Pack years: 2.50  Smokeless Tobacco Never Used     Outpatient Encounter Medications as of 06/29/2018  Medication Sig  . ibuprofen (ADVIL,MOTRIN) 200 MG tablet Take 400 mg by mouth every 6 (six) hours as needed.  Marland Kitchen amoxicillin-clavulanate (AUGMENTIN) 875-125 MG tablet Take 1 tablet by mouth 2 (two) times daily.  Marland Kitchen HYDROcodone-acetaminophen (  NORCO/VICODIN) 5-325 MG tablet Take 1 tablet by mouth every 6 (six) hours as needed for up to 3 days for moderate pain or severe pain.  . [DISCONTINUED] Multiple Vitamin (MULTIVITAMIN) tablet Take 1 tablet by mouth daily. Natures Gummi MVI for men-Take one daily  . [DISCONTINUED] oseltamivir (TAMIFLU) 75 MG capsule Take 1 capsule (75 mg total) by mouth every 12 (twelve) hours.   No facility-administered encounter medications on file as of 06/29/2018.     Allergies: Ciprofloxacin  Body mass index is 19.55 kg/m.   Blood pressure 118/67, pulse 88, temperature 99.2 F (37.3 C), temperature source Oral, height 6\' 2"  (1.88 m), weight 152 lb 4.8 oz (69.1 kg), SpO2 97 %.  Review of Systems  Constitutional: Positive for activity change and fatigue. Negative for appetite change, chills, diaphoresis, fever and unexpected weight change.  Eyes: Negative for visual disturbance.  Respiratory: Negative for cough, choking, chest tightness, shortness of breath, wheezing and stridor.   Cardiovascular: Negative for chest pain, palpitations and leg swelling.  Gastrointestinal: Positive for abdominal pain. Negative for abdominal distention, anal bleeding, blood in stool, constipation, diarrhea, nausea, rectal pain and vomiting.  Genitourinary: Positive for dysuria, flank pain and hematuria. Negative for decreased urine volume, difficulty urinating, discharge, enuresis, frequency, penile pain, penile swelling, scrotal swelling, testicular pain and urgency.  Musculoskeletal: Negative for arthralgias.  Neurological: Negative for dizziness and headaches.  Hematological: Does not bruise/bleed easily.  Psychiatric/Behavioral: Positive for sleep disturbance. Negative for agitation.       Objective:   Physical Exam Vitals signs and nursing note reviewed.  Constitutional:      General: He is not in acute distress.    Appearance: Normal appearance. He is not ill-appearing, toxic-appearing or diaphoretic.  HENT:     Head: Normocephalic and atraumatic.  Cardiovascular:     Rate and Rhythm: Normal rate.     Pulses: Normal pulses.     Heart sounds: Normal heart sounds. No murmur. No friction rub. No gallop.   Pulmonary:     Effort: Pulmonary effort is normal. No respiratory distress.     Breath sounds: Normal breath sounds. No stridor. No wheezing, rhonchi or rales.  Chest:     Chest wall: No tenderness.  Abdominal:     General: Abdomen is flat. Bowel sounds are normal. There is no distension or abdominal bruit. There are  no signs of injury.     Palpations: Abdomen is soft. There is no mass.     Tenderness: There is abdominal tenderness in the left lower quadrant. There is left CVA tenderness. There is no right CVA tenderness, guarding or rebound. Negative signs include Murphy's sign, Rovsing's sign, McBurney's sign, psoas sign and obturator sign.     Hernia: No hernia is present.       Comments: LLQ- TTP around well healed surgical scar of previous L inguinal hernia repair  Chaperone present during examination.  Skin:    General: Skin is warm and dry.     Capillary Refill: Capillary refill takes less than 2 seconds.  Neurological:     Mental Status: He is alert and oriented to person, place, and time.  Psychiatric:        Mood and Affect: Mood normal.        Behavior: Behavior normal.        Thought Content: Thought content normal.        Judgment: Judgment normal.       Assessment & Plan:   1. Hematuria, unspecified type  2. Abnormal urinalysis   3. LLQ pain   4. Unilateral recurrent inguinal hernia without obstruction or gangrene     Unilateral recurrent inguinal hernia without obstruction or gangrene Bil inguinal hernia repair as an infant. L inguinal hernia repair 2011, mess repair 2017 LLQ pain started 2 days ago Area TTP, no reducible mass appreciated.  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 Use OTC Acetaminophen and Ibuprofen for daytime pain control. Use goof body mechanics when lifting. We will call you when imaging results are available. If you develop severe pain, or have a bulge that you can not "push back in", please seek immediate medical care.  Hematuria 1) Your Urinalysis- Blood and Leukocytes detected- will start on antibiotic UTI vs Nephrolithiasis  We will send specimen for urine culture/sensitivity and call you with results. Continue to drink plenty of water. Referral  to Urologist placed (this is the second time you have had blood in your urine since Nov 2019).   Abnormal urinalysis UA: Blood: Large Nitrate: Neg Leu: Large Specimen sent for C/S  FOLLOW-UP:  Return if symptoms worsen or fail to improve.

## 2018-06-29 NOTE — Assessment & Plan Note (Signed)
UA: Blood: Large Nitrate: Neg Leu: Large Specimen sent for C/S

## 2018-06-29 NOTE — Progress Notes (Deleted)
Virtual Visit via Telephone Note  I connected with Isaiah Heath on 06/29/18 at  1:15 PM EDT by telephone and verified that I am speaking with the correct person using two identifiers.   I discussed the limitations, risks, security and privacy concerns of performing an evaluation and management service by telephone and the availability of in person appointments.The staff discussed with the patient that there may be a patient responsible charge related to this service. The patient expressed understanding and agreed to proceed.   History of Present Illness: [name] calls in today with several complaints: 1) He was treated for UTI 01/21/2018 with combo of Keflex and Cipro 2)    Patient Care Team    Relationship Specialty Notifications Start End  Julaine Fusianford, Katy D, NP PCP - General Family Medicine  12/29/16     Patient Active Problem List   Diagnosis Date Noted  . 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 Metatarsalgia of left foot 07/13/2017  . Edema of left foot 07/13/2017  . Left foot pain 07/13/2017  . Constipation 04/07/2017  . LLQ pain 04/07/2017  . Anxiety 02/21/2017  . Excessive cerumen in ear canal, right 01/03/2017  . Acute pain of right shoulder 12/29/2016  . Hematochezia 11/23/2016  . Healthcare maintenance 11/23/2016  . Nocturia 11/23/2016  . Pain of left middle finger 11/23/2016  . Dizziness 11/23/2016     Past Medical History:  Diagnosis Date  . Anxiety   . Diverticulitis   . Inguinal hernia, left      Past Surgical History:  Procedure Laterality Date  . HERNIA REPAIR Left [place]2010   [place] [place] [facility] Dr. Nelda Marseilleiblet  . INGUINAL HERNIA REPAIR Left 03/03/2016   Procedure: LEFT INGUNIAL HERNIA EXPLORATION;  Surgeon: Jimmye NormanJames Wyatt, MD;  [facility];  Service: General;  Laterality: Left;  . PROSTATE BIOPSY     ok     Family History  Problem Relation Age of Onset  . Diabetes Mother   . Hypertension Mother   . Stroke Father   .  Hypertension Father   . Diabetes Maternal Grandmother   . Hypertension Maternal Grandfather   . Hypertension Paternal Grandfather      Social History   Substance and Sexual Activity  Drug Use No     Social History   Substance and Sexual Activity  Alcohol Use No  . Frequency: Never     Social History   Tobacco Use  Smoking Status Current Some Day Smoker  . Packs/day: 0.25  . Years: 10.00  . Pack years: 2.50  Smokeless Tobacco Never Used     Outpatient Encounter Medications as of 06/29/2018  Medication Sig  . ibuprofen (ADVIL,MOTRIN) 200 MG tablet Take 400 mg by mouth every 6 (six) hours as needed.  . Multiple Vitamin (MULTIVITAMIN) tablet Take 1 tablet by mouth daily. Natures Gummi MVI for men-Take one daily  . oseltamivir (TAMIFLU) 75 MG capsule Take 1 capsule (75 mg total) by mouth every 12 (twelve) hours.   No facility-administered encounter medications on file as of 06/29/2018.     Allergies: Ciprofloxacin  There is no height or weight on file to calculate BMI.  There were no vitals taken for this visit.  Observations/Objective:   Assessment and Plan:   Follow Up Instructions:    I discussed the assessment and treatment plan with the patient. The patient was provided an opportunity to ask questions and all were answered. The patient agreed with the plan and demonstrated an understanding of the  instructions.   The patient was advised to call back or seek an in-person evaluation if the symptoms worsen or if the condition fails to improve as anticipated.  I provided *** minutes of non-face-to-face time during this encounter.   Julaine Fusi, NP

## 2018-06-29 NOTE — Assessment & Plan Note (Signed)
1) Your Urinalysis- Blood and Leukocytes detected- will start on antibiotic UTI vs Nephrolithiasis  We will send specimen for urine culture/sensitivity and call you with results. Continue to drink plenty of water. Referral to Urologist placed (this is the second time you have had blood in your urine since Nov 2019).

## 2018-06-30 ENCOUNTER — Emergency Department (HOSPITAL_BASED_OUTPATIENT_CLINIC_OR_DEPARTMENT_OTHER): Payer: BLUE CROSS/BLUE SHIELD

## 2018-06-30 ENCOUNTER — Encounter (HOSPITAL_BASED_OUTPATIENT_CLINIC_OR_DEPARTMENT_OTHER): Payer: Self-pay

## 2018-06-30 ENCOUNTER — Emergency Department (HOSPITAL_BASED_OUTPATIENT_CLINIC_OR_DEPARTMENT_OTHER)
Admission: EM | Admit: 2018-06-30 | Discharge: 2018-06-30 | Disposition: A | Discharge status: Home / Self Care | Payer: BLUE CROSS/BLUE SHIELD | Attending: Emergency Medicine | Admitting: Emergency Medicine

## 2018-06-30 ENCOUNTER — Other Ambulatory Visit: Payer: Self-pay

## 2018-06-30 DIAGNOSIS — N390 Urinary tract infection, site not specified: Secondary | ICD-10-CM | POA: Diagnosis not present

## 2018-06-30 DIAGNOSIS — R319 Hematuria, unspecified: Secondary | ICD-10-CM | POA: Diagnosis not present

## 2018-06-30 DIAGNOSIS — N509 Disorder of male genital organs, unspecified: Secondary | ICD-10-CM | POA: Diagnosis not present

## 2018-06-30 DIAGNOSIS — F17210 Nicotine dependence, cigarettes, uncomplicated: Secondary | ICD-10-CM | POA: Insufficient documentation

## 2018-06-30 DIAGNOSIS — R1032 Left lower quadrant pain: Secondary | ICD-10-CM

## 2018-06-30 LAB — CBC WITH DIFFERENTIAL/PLATELET
Abs Immature Granulocytes: 0.02 10*3/uL (ref 0.00–0.07)
Basophils Absolute: 0 10*3/uL (ref 0.0–0.1)
Basophils Relative: 1 %
Eosinophils Absolute: 0.2 10*3/uL (ref 0.0–0.5)
Eosinophils Relative: 2 %
HCT: 41.1 % (ref 39.0–52.0)
Hemoglobin: 12.6 g/dL — ABNORMAL LOW (ref 13.0–17.0)
Immature Granulocytes: 0 %
Lymphocytes Relative: 15 %
Lymphs Abs: 1.3 10*3/uL (ref 0.7–4.0)
MCH: 28.4 pg (ref 26.0–34.0)
MCHC: 30.7 g/dL (ref 30.0–36.0)
MCV: 92.8 fL (ref 80.0–100.0)
Monocytes Absolute: 0.7 10*3/uL (ref 0.1–1.0)
Monocytes Relative: 8 %
Neutro Abs: 6.3 10*3/uL (ref 1.7–7.7)
Neutrophils Relative %: 74 %
Platelets: 232 10*3/uL (ref 150–400)
RBC: 4.43 MIL/uL (ref 4.22–5.81)
RDW: 12.3 % (ref 11.5–15.5)
WBC: 8.5 10*3/uL (ref 4.0–10.5)
nRBC: 0 % (ref 0.0–0.2)

## 2018-06-30 LAB — COMPREHENSIVE METABOLIC PANEL
ALT: 15 U/L (ref 0–44)
AST: 22 U/L (ref 15–41)
Albumin: 4.2 g/dL (ref 3.5–5.0)
Alkaline Phosphatase: 74 U/L (ref 38–126)
Anion gap: 8 (ref 5–15)
BUN: 9 mg/dL (ref 6–20)
CO2: 26 mmol/L (ref 22–32)
Calcium: 9.3 mg/dL (ref 8.9–10.3)
Chloride: 103 mmol/L (ref 98–111)
Creatinine, Ser: 0.63 mg/dL (ref 0.61–1.24)
GFR calc Af Amer: >60 mL/min (ref >60)
GFR calc non Af Amer: >60 mL/min (ref >60)
Glucose, Bld: 97 mg/dL (ref 70–99)
Potassium: 3.8 mmol/L (ref 3.5–5.1)
Sodium: 137 mmol/L (ref 135–145)
Total Bilirubin: 0.5 mg/dL (ref 0.3–1.2)
Total Protein: 8.3 g/dL — ABNORMAL HIGH (ref 6.5–8.1)

## 2018-06-30 LAB — URINALYSIS, ROUTINE W REFLEX MICROSCOPIC
Bilirubin Urine: NEGATIVE
Glucose, UA: NEGATIVE mg/dL
Ketones, ur: NEGATIVE mg/dL
Nitrite: NEGATIVE
Protein, ur: NEGATIVE mg/dL
Specific Gravity, Urine: 1.025 (ref 1.005–1.030)
pH: 6.5 (ref 5.0–8.0)

## 2018-06-30 LAB — URINALYSIS, MICROSCOPIC (REFLEX)
RBC / HPF: >50 RBC/hpf (ref 0–5)
WBC, UA: >50 WBC/hpf (ref 0–5)

## 2018-06-30 LAB — LACTIC ACID, PLASMA: Lactic Acid, Venous: 0.9 mmol/L (ref 0.5–1.9)

## 2018-06-30 MED ORDER — OXYCODONE-ACETAMINOPHEN 5-325 MG PO TABS: 1.0000 | ORAL_TABLET | Freq: Four times a day (QID) | ORAL | 0 refills | Status: DC | PRN

## 2018-06-30 MED ORDER — SODIUM CHLORIDE 0.9 % IV BOLUS
1000.0000 mL | Freq: Once | INTRAVENOUS | Status: AC
  Administered 2018-06-30: 1000 mL via INTRAVENOUS

## 2018-06-30 MED ORDER — ONDANSETRON HCL 4 MG/2ML IJ SOLN
4.0000 mg | Freq: Once | INTRAMUSCULAR | Status: AC
  Administered 2018-06-30: 4 mg via INTRAVENOUS
  Filled 2018-06-30: qty 2

## 2018-06-30 MED ORDER — MORPHINE SULFATE (PF) 4 MG/ML IV SOLN
4.0000 mg | Freq: Once | INTRAVENOUS | Status: AC
  Administered 2018-06-30: 4 mg via INTRAVENOUS
  Filled 2018-06-30: qty 1

## 2018-06-30 MED ORDER — SULFAMETHOXAZOLE-TRIMETHOPRIM 800-160 MG PO TABS: 1.0000 | ORAL_TABLET | Freq: Two times a day (BID) | ORAL | 0 refills | Status: DC

## 2018-06-30 MED ORDER — MORPHINE SULFATE (PF) 4 MG/ML IV SOLN
4.0000 mg | Freq: Once | INTRAVENOUS | Status: AC
  Administered 2018-06-30: 19:00:00 4 mg via INTRAVENOUS
  Filled 2018-06-30: qty 1

## 2018-06-30 MED ORDER — SULFAMETHOXAZOLE-TRIMETHOPRIM 800-160 MG PO TABS: 1.0000 | ORAL_TABLET | Freq: Two times a day (BID) | ORAL | 0 refills | Status: AC

## 2018-06-30 MED ORDER — SULFAMETHOXAZOLE-TRIMETHOPRIM 800-160 MG PO TABS
1.0000 | ORAL_TABLET | Freq: Once | ORAL | Status: AC
  Administered 2018-06-30: 1 via ORAL
  Filled 2018-06-30: qty 1

## 2018-06-30 NOTE — ED Provider Notes (Signed)
[facility] EMERGENCY DEPARTMENT Provider Note   CSN: [id number] Arrival date & time: 06/30/18  1536    History   Chief Complaint Chief Complaint  Patient presents with  . hematuria  . Pelvic Pain    HPI Isaiah Heath is a 38 y.o. male.     HPI   Patient is a 38 year old male with a past medical history of anxiety, diverticulitis, left inguinal hernia repair, urinary tract infections presenting for left-sided suprapubic pain and hematuria.  Patient reports that his symptoms began 3 days ago.  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  Patient reports that he does feel that there is a "swelling" in the left inguinal region.  Since then, patient also reports noting episodes of gross hematuria.  It does not occur every time he uses the bathroom.  He denies any testicular pain, erythema, or swelling.  He does report some dysuria.  He denies any flank pain.  Patient denies any history of nephrolithiasis.  Patient ports he is in a monogamous sexual relationship with his wife and does not have concerns about STI.  He has not had an STI before.  Patient reports that he had a somewhat similar presentation in November 2019 when he was treated for urinary tract infection.  He was negative for GC or chlamydia at that time.  Patient reports he saw his primary care provider yesterday who is referring him to a urologist and placed him on amoxicillin for UTI.  Past Medical History:  Diagnosis Date  . Anxiety   . Diverticulitis   . Inguinal hernia, left     Patient Active Problem List   Diagnosis Date Noted  . Hematuria 06/29/2018  . Abnormal urinalysis 06/29/2018  . Unilateral recurrent inguinal hernia without obstruction or gangrene 06/29/2018  . 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 Metatarsalgia of left foot 07/13/2017  . Edema of  left foot 07/13/2017  . Left foot pain 07/13/2017  . Constipation 04/07/2017  . LLQ pain 04/07/2017  . Anxiety 02/21/2017  . Excessive cerumen in ear canal, right 01/03/2017  . Acute pain of right shoulder 12/29/2016  . Hematochezia 11/23/2016  . Healthcare maintenance 11/23/2016  . Nocturia 11/23/2016  . Pain of left middle finger 11/23/2016  . Dizziness 11/23/2016    Past Surgical History:  Procedure Laterality Date  . HERNIA REPAIR Left [place], [place] [facility], Dr. Nelda Marseille  . INGUINAL HERNIA REPAIR Left 03/03/2016   Procedure: LEFT INGUNIAL HERNIA EXPLORATION;  Surgeon: Jimmye Norman, MD;  Location: [facility] OR;  Service: General;  Laterality: Left;  . PROSTATE BIOPSY     ok        Home Medications    Prior to Admission medications   Medication Sig Start Date End Date Taking? Authorizing Provider  amoxicillin-clavulanate (AUGMENTIN) 875-125 MG tablet Take 1 tablet by mouth 2 (two) times daily. 06/29/18  Yes Danford, Orpha Bur D, NP  HYDROcodone-acetaminophen (NORCO/VICODIN) 5-325 MG tablet Take 1 tablet by mouth every 6 (six) hours as needed for up to 3 days for moderate pain or severe pain. 06/29/18 07/02/18 Yes Danford, Orpha Bur D, NP  ibuprofen (ADVIL,MOTRIN) 200 MG tablet Take 400 mg by mouth every 6 (six) hours as needed.   Yes [provider]    Family History Family History  Problem Relation Age of Onset  .  Diabetes Mother   . Hypertension Mother   . Stroke Father   . Hypertension Father   . Diabetes Maternal Grandmother   . Hypertension Maternal Grandfather   . Hypertension Paternal Grandfather     Social History Social History   Tobacco Use  . Smoking status: Current Some Day Smoker    Packs/day: 0.25    Years: 10.00    Pack years: 2.50    Types: Cigarettes  . Smokeless tobacco: Never Used  Substance Use Topics  . Alcohol use: No    Frequency: Never  . Drug use: No     Allergies   Ciprofloxacin   Review of Systems Review of  Systems  Constitutional: Negative for chills and fever.  HENT: Negative for congestion and sore throat.   Eyes: Negative for visual disturbance.  Respiratory: Negative for cough, chest tightness and shortness of breath.   Cardiovascular: Negative for chest pain.  Gastrointestinal: Positive for abdominal pain. Negative for diarrhea, nausea and vomiting.  Genitourinary: Positive for dysuria and hematuria. Negative for flank pain, scrotal swelling, testicular pain and urgency.       +Inguinal pain  Musculoskeletal: Negative for back pain and myalgias.  Skin: Negative for rash.  Neurological: Negative for dizziness and light-headedness.     Physical Exam Updated Vital Signs BP (!) 132/94 (BP Location: Right Arm)   Pulse 81   Temp 98.7 F (37.1 C) (Oral)   Ht 6\' 2"  (1.88 m)   Wt 69.4 kg   SpO2 100%   BMI 19.64 kg/m   Physical Exam Vitals signs and nursing note reviewed.  Constitutional:      General: He is not in acute distress.    Appearance: He is well-developed.  HENT:     Head: Normocephalic and atraumatic.  Eyes:     Conjunctiva/sclera: Conjunctivae normal.     Pupils: Pupils are equal, round, and reactive to light.  Neck:     Musculoskeletal: Normal range of motion and neck supple.  Cardiovascular:     Rate and Rhythm: Normal rate and regular rhythm.     Heart sounds: S1 normal and S2 normal. No murmur.  Pulmonary:     Effort: Pulmonary effort is normal.     Breath sounds: Normal breath sounds. No wheezing or rales.  Abdominal:     General: There is no distension.     Palpations: Abdomen is soft.     Tenderness: There is abdominal tenderness. There is no guarding.     Comments: TTP of left inguinal region.   Genitourinary:    Comments: GU exam performed with RN chaperone present.  No testicular pain, swelling, or erythema.  No pain in the left spermatic cord.  Hernia exam performed in recumbent and standing position.  No specific abdominal wall defect palpated of  the inguinal region.  No bulge felt on Valsalva maneuver. Musculoskeletal: Normal range of motion.        General: No deformity.  Lymphadenopathy:     Cervical: No cervical adenopathy.  Skin:    General: Skin is warm and dry.     Findings: No erythema or rash.  Neurological:     Mental Status: He is alert.     Comments: Cranial nerves grossly intact. Patient moves extremities symmetrically and with good coordination.  Psychiatric:        Behavior: Behavior normal.        Thought Content: Thought content normal.        Judgment: Judgment normal.  ED Treatments / Results  Labs (all labs ordered are listed, but only abnormal results are displayed) Labs Reviewed  URINALYSIS, ROUTINE W REFLEX MICROSCOPIC - Abnormal; Notable for the following components:      Result Value   APPearance CLOUDY (*)    Hgb urine dipstick LARGE (*)    Leukocytes,Ua LARGE (*)    All other components within normal limits  URINALYSIS, MICROSCOPIC (REFLEX) - Abnormal; Notable for the following components:   Bacteria, UA MANY (*)    All other components within normal limits  CBC WITH DIFFERENTIAL/PLATELET - Abnormal; Notable for the following components:   Hemoglobin 12.6 (*)    All other components within normal limits  COMPREHENSIVE METABOLIC PANEL - Abnormal; Notable for the following components:   Total Protein 8.3 (*)    All other components within normal limits  URINE CULTURE  LACTIC ACID, PLASMA  LACTIC ACID, PLASMA  GC/CHLAMYDIA PROBE AMP ([place]) NOT AT [facility]    EKG None  Radiology Ct Renal Stone Study  Result Date: 06/30/2018 CLINICAL DATA:  LEFT groin pain with hematuria. EXAM: CT ABDOMEN AND PELVIS WITHOUT CONTRAST TECHNIQUE: Multidetector CT imaging of the abdomen and pelvis was performed following the standard protocol without IV contrast. COMPARISON:  CT abdomen dated 01/21/2018. FINDINGS: Lower chest: No acute abnormality. Hepatobiliary: No focal liver abnormality is seen.  No gallstones, gallbladder wall thickening, or biliary dilatation. Pancreas: Majority of the pancreas is not well seen due to overlapping non-opacified bowel loops. No peripancreatic fluid appreciated. Spleen: Normal in size without focal abnormality. Adrenals/Urinary Tract: Adrenal glands are unremarkable. Kidneys are unremarkable without mass, stone or hydronephrosis. No perinephric fluid seen. No ureteral or bladder calculi identified. Bladder is decompressed. Stomach/Bowel: No dilated large or small bowel loops appreciated. No secondary signs of bowel wall inflammation seen. Appendix is obscured by surrounding non-opacified bowel loops. Stomach is unremarkable, partially decompressed. Vascular/Lymphatic: No significant vascular findings are present. No enlarged abdominal or pelvic lymph nodes. Reproductive: Prostate is unremarkable. Other: Asymmetric soft tissue thickening within the LEFT inguinal canal, with probable surrounding soft tissue edema. Musculoskeletal: No acute or suspicious osseous finding. Degenerative spondylosis at the lumbosacral joint space, mild to moderate in degree with associated disc space narrowing and osseous spurring. IMPRESSION: 1. Asymmetric soft tissue thickening within the left inguinal canal, with probable surrounding soft tissue edema (series 2, images 61 through 72). This is of uncertain etiology, with differential considerations of infectious or inflammatory process of the structures within the inguinal canal. No herniated bowel loops appreciated, although characterization of the bowel is limited by the lack of oral contrast. Recommend ultrasound of the LEFT scrotum and groin for further characterization. 2. Remainder of the abdomen and pelvis CT is unremarkable, as detailed above. No renal or ureteral calculi identified. Electronically Signed   By: Bary Richard M.D.   On: 06/30/2018 16:52    Procedures Procedures (including critical care time)  Medications Ordered in ED  Medications  sodium chloride 0.9 % bolus 1,000 mL (has no administration in time range)     Initial Impression / Assessment and Plan / ED Course  I have reviewed the triage vital signs and the nursing notes.  Pertinent labs & imaging results that were available during my care of the patient were reviewed by me and considered in my medical decision making (see chart for details).  Clinical Course as of Jun 29 1628  Fri Jun 30, 2018  1626 Patient verbally verified a safe ride from the ED. Proceeded with  prescribing morphine for pain/relaxtion/muscle relaxation in the ED.   [AM]    Clinical Course User Index [AM] Elisha Ponder, PA-C       Patient nontoxic-appearing, afebrile, and with nonsurgical abdomen peer differential diagnosis includes nephrolithiasis, incarcerated inguinal hernia, epididymitis, prostatitis, urinary tract infection, pyelonephritis.  Work-up demonstrating no leukocytosis.  Normal renal function.  Patient continues to have urinalysis with large hemoglobin, greater than 50 WBCs, large leukocytes and many bacteria.  Will send for GC chlamydia and urine.  Lactic acid is normal, therefore doubt strangulated hernia.  CT renal stone study demonstrating no stones, but does demonstrate " asymmetric soft tissue thickening within the left inguinal canal with probable surrounding soft tissue edema.  This is of uncertain etiology with differential considerations of infectious or inflammatory process of the structures within the inguinal canal."  Per radiology recommendations, will obtain ultrasound scrotum and groin.   Records reviewed of patient's inguinal hernia exploration in 2017.  Appears that patient had entrapment of nerve within the mesh.  Will give patient first dose of Bactrim here in emergency department.  Ultrasound is pending.  Patient to be followed by Dr. Emily Filbert.  Patient updated on plan of care.  Final Clinical Impressions(s) / ED Diagnoses   Final  diagnoses:  Left inguinal pain    ED Discharge Orders    None       Delia Chimes 06/30/18 1753    Geoffery Lyons, MD 07/01/18 1520

## 2018-06-30 NOTE — ED Triage Notes (Signed)
Pt complains of left lower abdominal pain for the past two days. Pain worsens with movement and cough. He notes hx of left inguinal hernia repair and states pain feels similar. Pt states he has also noticed blood in his urine. Pt states he was placed on amoxicillin for possible UTI but symptoms have continued to worsen.

## 2018-06-30 NOTE — Discharge Instructions (Addendum)
Bactrim as prescribed.  Percocet as prescribed as needed for pain.  Apply warm compresses to the area as frequently as possible for the next several days.  Follow-up with urology in the next few days.  [facility] Monday morning to make these arrangements.  Dr. Berneice Heinrich is aware of your situation and wants you seen this week.  Return to the emergency department if you develop worsening pain, high fever, or other new and concerning symptoms.

## 2018-06-30 NOTE — ED Notes (Signed)
Patient transported to CT 

## 2018-07-01 LAB — CULTURE, URINE COMPREHENSIVE

## 2018-07-02 LAB — URINE CULTURE: Culture: 20000 — AB

## 2018-07-03 ENCOUNTER — Telehealth: Payer: Self-pay | Admitting: *Deleted

## 2018-07-03 ENCOUNTER — Other Ambulatory Visit: Payer: Self-pay | Admitting: Adult Health

## 2018-07-03 NOTE — Telephone Encounter (Signed)
Post ED Visit - Positive Culture Follow-up  Culture report reviewed by antimicrobial stewardship pharmacist: [facility] []  [address] Pharm.D. []  Celedonio Miyamoto, Pharm.D., BCPS AQ-ID []  Garvin Fila, Pharm.D., BCPS []  Georgina Pillion, Pharm.D., BCPS []  [place], [address].D., BCPS, AAHIVP []  Estella Husk, Pharm.D., BCPS, AAHIVP []  Lysle Pearl, PharmD, BCPS []  Phillips Climes, PharmD, BCPS []  Agapito Games, PharmD, BCPS []  Verlan Friends, PharmD []  Mervyn Gay, PharmD, BCPS []  Vinnie Level, PharmD  [facility] []  Len Childs, PharmD []  Greer Pickerel, PharmD []  Adalberto Cole, PharmD []  Perlie Gold, Rph []  Lonell Face) Jean Rosenthal, PharmD []  Earl Many, PharmD []  Junita Push, PharmD []  Dorna Leitz, PharmD []  Terrilee Files, PharmD []  Lynann Beaver, PharmD []  Keturah Barre, PharmD []  Loralee Pacas, PharmD []  Bernadene Person, PharmD Lenward Chancellor, PharmD  Positive urine culture Treated with Sulfamethoxazole-Trimethoprim, organism sensitive to the same and no further patient follow-up is required at this time.  Virl Axe [facility] 07/03/2018, 9:02 AM

## 2018-07-05 ENCOUNTER — Other Ambulatory Visit: Payer: BLUE CROSS/BLUE SHIELD

## 2018-08-10 ENCOUNTER — Telehealth: Payer: Self-pay | Admitting: Adult Health

## 2018-08-10 NOTE — Telephone Encounter (Signed)
Patient called to request provider call in a Antibiotic (says his infection is back but did not disclose to me what type or where)  --forwarding msg to medical asst to call pt at [phone]  --glh

## 2018-08-10 NOTE — Telephone Encounter (Signed)
Pt c/o dysuria and abdominal pain and thinks that he has a UTI again.  Pt is scheduled to see urology 08/15/2018.  Per Orpha Bur, advised pt to be seen at [facility] for evaluation and possible treatment.  Pt expressed understanding and is agreeable.  Tiajuana Amass, CMA

## 2018-08-11 ENCOUNTER — Other Ambulatory Visit: Payer: Self-pay

## 2018-08-11 ENCOUNTER — Inpatient Hospital Stay (HOSPITAL_BASED_OUTPATIENT_CLINIC_OR_DEPARTMENT_OTHER)
Admission: EM | Admit: 2018-08-11 | Discharge: 2018-08-14 | DRG: 872 | Disposition: A | Discharge status: Home / Self Care | Payer: BLUE CROSS/BLUE SHIELD | Attending: Internal Medicine | Admitting: Internal Medicine

## 2018-08-11 ENCOUNTER — Emergency Department (HOSPITAL_BASED_OUTPATIENT_CLINIC_OR_DEPARTMENT_OTHER): Payer: BLUE CROSS/BLUE SHIELD

## 2018-08-11 ENCOUNTER — Encounter (HOSPITAL_BASED_OUTPATIENT_CLINIC_OR_DEPARTMENT_OTHER): Payer: Self-pay | Admitting: Emergency Medicine

## 2018-08-11 DIAGNOSIS — E876 Hypokalemia: Secondary | ICD-10-CM | POA: Diagnosis not present

## 2018-08-11 DIAGNOSIS — Z1159 Encounter for screening for other viral diseases: Secondary | ICD-10-CM | POA: Diagnosis not present

## 2018-08-11 DIAGNOSIS — R630 Anorexia: Secondary | ICD-10-CM | POA: Diagnosis not present

## 2018-08-11 DIAGNOSIS — Z888 Allergy status to other drugs, medicaments and biological substances status: Secondary | ICD-10-CM

## 2018-08-11 DIAGNOSIS — Z79899 Other long term (current) drug therapy: Secondary | ICD-10-CM | POA: Diagnosis not present

## 2018-08-11 DIAGNOSIS — N12 Tubulo-interstitial nephritis, not specified as acute or chronic: Secondary | ICD-10-CM | POA: Diagnosis not present

## 2018-08-11 DIAGNOSIS — F17210 Nicotine dependence, cigarettes, uncomplicated: Secondary | ICD-10-CM | POA: Diagnosis present

## 2018-08-11 DIAGNOSIS — Z885 Allergy status to narcotic agent status: Secondary | ICD-10-CM | POA: Diagnosis not present

## 2018-08-11 DIAGNOSIS — Z681 Body mass index (BMI) 19 or less, adult: Secondary | ICD-10-CM | POA: Diagnosis not present

## 2018-08-11 DIAGNOSIS — D72829 Elevated white blood cell count, unspecified: Secondary | ICD-10-CM | POA: Diagnosis not present

## 2018-08-11 DIAGNOSIS — F419 Anxiety disorder, unspecified: Secondary | ICD-10-CM | POA: Diagnosis present

## 2018-08-11 DIAGNOSIS — N10 Acute tubulo-interstitial nephritis: Secondary | ICD-10-CM | POA: Diagnosis not present

## 2018-08-11 DIAGNOSIS — A4151 Sepsis due to Escherichia coli [E. coli]: Principal | ICD-10-CM | POA: Diagnosis present

## 2018-08-11 DIAGNOSIS — R7881 Bacteremia: Secondary | ICD-10-CM | POA: Diagnosis not present

## 2018-08-11 DIAGNOSIS — R509 Fever, unspecified: Secondary | ICD-10-CM | POA: Diagnosis not present

## 2018-08-11 DIAGNOSIS — Z20828 Contact with and (suspected) exposure to other viral communicable diseases: Secondary | ICD-10-CM | POA: Diagnosis not present

## 2018-08-11 LAB — COMPREHENSIVE METABOLIC PANEL
ALT: 16 U/L (ref 0–44)
AST: 19 U/L (ref 15–41)
Albumin: 3.7 g/dL (ref 3.5–5.0)
Alkaline Phosphatase: 58 U/L (ref 38–126)
Anion gap: 7 (ref 5–15)
BUN: 9 mg/dL (ref 6–20)
CO2: 23 mmol/L (ref 22–32)
Calcium: 8.7 mg/dL — ABNORMAL LOW (ref 8.9–10.3)
Chloride: 105 mmol/L (ref 98–111)
Creatinine, Ser: 0.75 mg/dL (ref 0.61–1.24)
GFR calc Af Amer: >60 mL/min (ref >60)
GFR calc non Af Amer: >60 mL/min (ref >60)
Glucose, Bld: 102 mg/dL — ABNORMAL HIGH (ref 70–99)
Potassium: 3 mmol/L — ABNORMAL LOW (ref 3.5–5.1)
Sodium: 135 mmol/L (ref 135–145)
Total Bilirubin: 0.8 mg/dL (ref 0.3–1.2)
Total Protein: 6.8 g/dL (ref 6.5–8.1)

## 2018-08-11 LAB — BLOOD CULTURE ID PANEL (REFLEXED)

## 2018-08-11 LAB — CBC WITH DIFFERENTIAL/PLATELET
Abs Immature Granulocytes: 0.06 10*3/uL (ref 0.00–0.07)
Basophils Absolute: 0 10*3/uL (ref 0.0–0.1)
Basophils Relative: 0 %
Eosinophils Absolute: 0.1 10*3/uL (ref 0.0–0.5)
Eosinophils Relative: 0 %
HCT: 40.1 % (ref 39.0–52.0)
Hemoglobin: 12.4 g/dL — ABNORMAL LOW (ref 13.0–17.0)
Immature Granulocytes: 0 %
Lymphocytes Relative: 3 %
Lymphs Abs: 0.6 10*3/uL — ABNORMAL LOW (ref 0.7–4.0)
MCH: 28.5 pg (ref 26.0–34.0)
MCHC: 30.9 g/dL (ref 30.0–36.0)
MCV: 92.2 fL (ref 80.0–100.0)
Monocytes Absolute: 1.3 10*3/uL — ABNORMAL HIGH (ref 0.1–1.0)
Monocytes Relative: 7 %
Neutro Abs: 17 10*3/uL — ABNORMAL HIGH (ref 1.7–7.7)
Neutrophils Relative %: 90 %
Platelets: 157 10*3/uL (ref 150–400)
RBC: 4.35 MIL/uL (ref 4.22–5.81)
RDW: 13 % (ref 11.5–15.5)
WBC: 19 10*3/uL — ABNORMAL HIGH (ref 4.0–10.5)
nRBC: 0 % (ref 0.0–0.2)

## 2018-08-11 LAB — URINALYSIS, ROUTINE W REFLEX MICROSCOPIC
Bilirubin Urine: NEGATIVE
Glucose, UA: NEGATIVE mg/dL
Ketones, ur: 15 mg/dL — AB
Nitrite: NEGATIVE
Protein, ur: 100 mg/dL — AB
Specific Gravity, Urine: 1.02 (ref 1.005–1.030)
pH: 6 (ref 5.0–8.0)

## 2018-08-11 LAB — LIPASE, BLOOD: Lipase: 19 U/L (ref 11–51)

## 2018-08-11 LAB — SARS CORONAVIRUS 2 BY RT PCR (HOSPITAL ORDER, PERFORMED IN ~~LOC~~ HOSPITAL LAB): SARS Coronavirus 2: NEGATIVE

## 2018-08-11 LAB — URINALYSIS, MICROSCOPIC (REFLEX): RBC / HPF: >50 RBC/hpf (ref 0–5)

## 2018-08-11 LAB — SARS CORONAVIRUS 2 AG (30 MIN TAT): SARS Coronavirus 2 Ag: NEGATIVE

## 2018-08-11 MED ORDER — ACETAMINOPHEN 650 MG RE SUPP: 650.0000 mg | Freq: Four times a day (QID) | RECTAL | Status: DC | PRN

## 2018-08-11 MED ORDER — OXYCODONE-ACETAMINOPHEN 5-325 MG PO TABS
1.0000 | ORAL_TABLET | ORAL | Status: DC | PRN
  Administered 2018-08-11 – 2018-08-14 (×12): 2 via ORAL
  Filled 2018-08-11 (×12): qty 2

## 2018-08-11 MED ORDER — SODIUM CHLORIDE 0.9 % IV SOLN: 1.0000 g | Freq: Once | INTRAVENOUS | Status: DC

## 2018-08-11 MED ORDER — SODIUM CHLORIDE 0.9 % IV SOLN
INTRAVENOUS | Status: DC
  Administered 2018-08-11 – 2018-08-14 (×7): via INTRAVENOUS

## 2018-08-11 MED ORDER — ENOXAPARIN SODIUM 40 MG/0.4ML ~~LOC~~ SOLN
40.0000 mg | SUBCUTANEOUS | Status: DC
  Administered 2018-08-12 – 2018-08-13 (×2): 40 mg via SUBCUTANEOUS
  Filled 2018-08-11 (×2): qty 0.4

## 2018-08-11 MED ORDER — ONDANSETRON HCL 4 MG/2ML IJ SOLN
4.0000 mg | Freq: Once | INTRAMUSCULAR | Status: AC
  Administered 2018-08-11: 4 mg via INTRAVENOUS
  Filled 2018-08-11: qty 2

## 2018-08-11 MED ORDER — MORPHINE SULFATE (PF) 4 MG/ML IV SOLN
4.0000 mg | Freq: Once | INTRAVENOUS | Status: AC
  Administered 2018-08-11: 4 mg via INTRAVENOUS
  Filled 2018-08-11: qty 1

## 2018-08-11 MED ORDER — SODIUM CHLORIDE 0.9 % IV SOLN
2.0000 g | INTRAVENOUS | Status: DC
  Administered 2018-08-12 – 2018-08-14 (×3): 2 g via INTRAVENOUS
  Filled 2018-08-11 (×3): qty 2

## 2018-08-11 MED ORDER — ACETAMINOPHEN 500 MG PO TABS
1000.0000 mg | ORAL_TABLET | Freq: Once | ORAL | Status: AC
  Administered 2018-08-11: 09:00:00 1000 mg via ORAL
  Filled 2018-08-11: qty 2

## 2018-08-11 MED ORDER — ONDANSETRON HCL 4 MG PO TABS: 4.0000 mg | ORAL_TABLET | Freq: Four times a day (QID) | ORAL | Status: DC | PRN

## 2018-08-11 MED ORDER — SODIUM CHLORIDE 0.9 % IV SOLN
1.0000 g | Freq: Once | INTRAVENOUS | Status: AC
  Administered 2018-08-11: 08:00:00 1 g via INTRAVENOUS
  Filled 2018-08-11: qty 10

## 2018-08-11 MED ORDER — IOHEXOL 300 MG/ML  SOLN
100.0000 mL | Freq: Once | INTRAMUSCULAR | Status: AC | PRN
  Administered 2018-08-11: 06:00:00 100 mL via INTRAVENOUS

## 2018-08-11 MED ORDER — MORPHINE SULFATE (PF) 4 MG/ML IV SOLN
4.0000 mg | INTRAVENOUS | Status: DC | PRN
  Administered 2018-08-11 – 2018-08-12 (×4): 4 mg via INTRAVENOUS
  Filled 2018-08-11 (×4): qty 1

## 2018-08-11 MED ORDER — ONDANSETRON HCL 4 MG/2ML IJ SOLN: 4.0000 mg | Freq: Four times a day (QID) | INTRAMUSCULAR | Status: DC | PRN

## 2018-08-11 MED ORDER — IBUPROFEN 800 MG PO TABS
800.0000 mg | ORAL_TABLET | Freq: Once | ORAL | Status: AC
  Administered 2018-08-11: 11:00:00 800 mg via ORAL
  Filled 2018-08-11: qty 1

## 2018-08-11 MED ORDER — SODIUM CHLORIDE 0.9 % IV BOLUS
1000.0000 mL | Freq: Once | INTRAVENOUS | Status: AC
  Administered 2018-08-11: 1000 mL via INTRAVENOUS

## 2018-08-11 MED ORDER — SODIUM CHLORIDE 0.9 % IV SOLN
1.0000 g | Freq: Once | INTRAVENOUS | Status: AC
  Administered 2018-08-11: 1 g via INTRAVENOUS
  Filled 2018-08-11: qty 1

## 2018-08-11 MED ORDER — ACETAMINOPHEN 325 MG PO TABS
650.0000 mg | ORAL_TABLET | Freq: Four times a day (QID) | ORAL | Status: DC | PRN
  Administered 2018-08-12: 650 mg via ORAL
  Filled 2018-08-11: qty 2

## 2018-08-11 MED ORDER — ONDANSETRON HCL 4 MG/2ML IJ SOLN
4.0000 mg | Freq: Once | INTRAMUSCULAR | Status: AC
  Administered 2018-08-11: 08:00:00 4 mg via INTRAVENOUS
  Filled 2018-08-11: qty 2

## 2018-08-11 NOTE — ED Provider Notes (Signed)
I received this patient in signout from Dr. Preston Fleeting.  Briefly, he had presented with 5 days of abdominal pain, fevers, vomiting.  At time of signout, CT of abdomen and pelvis pending, differential including appendicitis.  CT shows right-sided pyelonephritis, no evidence of obstructing stone. Ordered CTX, blood and urine cultures. Pt remains in pain requiring IV narcotics and given fever and leukocytosis as well as duration of sx, recommended admission for continued treatment. Discussed w/ [facility], Dr. Elvera Lennox, who has accepted transfer to [facility] for admission. Pt given tylenol for fever as well as morphine and zofran.   Little, Ambrose Finland, MD 08/11/18 [id number]

## 2018-08-11 NOTE — ED Provider Notes (Signed)
[facility] EMERGENCY DEPARTMENT Provider Note   CSN: [id number] Arrival date & time: 08/11/18  0454    History   Chief Complaint No chief complaint on file.   HPI Isaiah Heath is a 38 y.o. male.   The history is provided by the patient.  He has a history of diverticulitis and comes in complaining of right mid abdominal pain for the last 5 days.  Pain is constant and has been getting worse.  He describes a burning pain which he rates at 7/10.  It is worse with taking a deep breath and worse with getting in and out of his truck.  He denies nausea or vomiting.  There has been mild anorexia.  He denies constipation or diarrhea.  There has been fever as high as 102.5 and he has had associated chills and sweats.  He denies exposure to anyone with COVID-19.  Past Medical History:  Diagnosis Date  . Anxiety   . Diverticulitis   . Inguinal hernia, left     Patient Active Problem List   Diagnosis Date Noted  . Hematuria 06/29/2018  . Abnormal urinalysis 06/29/2018  . Unilateral recurrent inguinal hernia without obstruction or gangrene 06/29/2018  . 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 Metatarsalgia of left foot 07/13/2017  . Edema of left foot 07/13/2017  . Left foot pain 07/13/2017  . Constipation 04/07/2017  . LLQ pain 04/07/2017  . Anxiety 02/21/2017  . Excessive cerumen in ear canal, right 01/03/2017  . Acute pain of right shoulder 12/29/2016  . Hematochezia 11/23/2016  . Healthcare maintenance 11/23/2016  . Nocturia 11/23/2016  . Pain of left middle finger 11/23/2016  . Dizziness 11/23/2016    Past Surgical History:  Procedure Laterality Date  . HERNIA REPAIR Left [place]2010   [place] [place] [facility] Dr. Nelda Marseilleiblet  . INGUINAL HERNIA REPAIR Left 03/03/2016   Procedure: LEFT INGUNIAL HERNIA EXPLORATION;  Surgeon: Jimmye NormanJames Wyatt, MD;  [facility];  Service: General;  Laterality: Left;  . PROSTATE BIOPSY     ok        Home Medications    Prior to Admission medications   Medication Sig Start Date End Date Taking? Authorizing Provider  ibuprofen (ADVIL,MOTRIN) 200 MG tablet Take 400 mg by mouth every 6 (six) hours as needed.    [provider]  oxyCODONE-acetaminophen (PERCOCET) 5-325 MG tablet Take 1-2 tablets by mouth every 6 (six) hours as needed. 06/30/18   Geoffery Lyonselo, Douglas, MD    Family History Family History  Problem Relation Age of Onset  . Diabetes Mother   . Hypertension Mother   . Stroke Father   . Hypertension Father   . Diabetes Maternal Grandmother   . Hypertension Maternal Grandfather   . Hypertension Paternal Grandfather     Social History Social History   Tobacco Use  . Smoking status: Current Some Day Smoker    Packs/day: 0.25    Years: 10.00    Pack years: 2.50    Types: Cigarettes  . Smokeless tobacco: Never Used  Substance Use Topics  . Alcohol use: No    Frequency: Never  . Drug use: No     Allergies   Ciprofloxacin   Review of Systems Review of Systems  All other systems reviewed and are negative.    Physical Exam Updated Vital Signs BP 122/75 (BP Location: Right Arm)   Pulse (!) 102   Temp 99.3 F (37.4 C) (Oral)   Resp 18   Ht 6\' 2"  (1.88  m)   Wt 68.9 kg   SpO2 100%   BMI 19.52 kg/m   Physical Exam Vitals signs and nursing note reviewed.    38 year old male, resting comfortably and in no acute distress. Vital signs are significant for mildly elevated heart rate. Oxygen saturation is 100%, which is normal. Head is normocephalic and atraumatic. PERRLA, EOMI. Oropharynx is clear. Neck is nontender and supple without adenopathy or JVD. Back is nontender and there is no CVA tenderness. Lungs are clear without rales, wheezes, or rhonchi. Chest is nontender. Heart has regular rate and rhythm without murmur. Abdomen is soft, flat, with moderate right mid abdominal tenderness.  There is plus minus rebound tenderness.  There is tenderness to percussion.  There is  no guarding.  There are no masses or hepatosplenomegaly and peristalsis is hypoactive. Extremities have no cyanosis or edema, full range of motion is present. Skin is warm and dry without rash. Neurologic: Mental status is normal, cranial nerves are intact, there are no motor or sensory deficits.  ED Treatments / Results  Labs (all labs ordered are listed, but only abnormal results are displayed) Labs Reviewed  URINALYSIS, ROUTINE W REFLEX MICROSCOPIC - Abnormal; Notable for the following components:      Result Value   APPearance CLOUDY (*)    Hgb urine dipstick LARGE (*)    Ketones, ur 15 (*)    Protein, ur 100 (*)    Leukocytes,Ua SMALL (*)    All other components within normal limits  COMPREHENSIVE METABOLIC PANEL - Abnormal; Notable for the following components:   Potassium 3.0 (*)    Glucose, Bld 102 (*)    Calcium 8.7 (*)    All other components within normal limits  CBC WITH DIFFERENTIAL/PLATELET - Abnormal; Notable for the following components:   WBC 19.0 (*)    Hemoglobin 12.4 (*)    Neutro Abs 17.0 (*)    Lymphs Abs 0.6 (*)    Monocytes Absolute 1.3 (*)    All other components within normal limits  URINALYSIS, MICROSCOPIC (REFLEX) - Abnormal; Notable for the following components:   Bacteria, UA MANY (*)    All other components within normal limits  SARS CORONAVIRUS 2 (HOSP ORDER, PERFORMED IN [place] [facility] VIA [facility]BBOTT ID)  CULTURE, BLOOD (ROUTINE X 2)  CULTURE, BLOOD (ROUTINE X 2)  SARS CORONAVIRUS 2 (HOSPITAL ORDER, PERFORMED IN [facility])  URINE CULTURE  BLOOD CULTURE ID PANEL (REFLEXED)  LIPASE, BLOOD  COMPREHENSIVE METABOLIC PANEL  CBC    Radiology No results found.  Procedures Procedures  Medications Ordered in ED Medications - No data to display   Initial Impression / Assessment and Plan / ED Course  I have reviewed the triage vital signs and the nursing notes.  Pertinent labs & imaging results that were available during my care  of the patient were reviewed by me and considered in my medical decision making (see chart for details).  Abdominal pain worrisome for appendicitis.  Also consider diverticulitis, pancreatitis, urinary tract infection.  Old records are reviewed, and he does have ED visits for left-sided abdominal pain and ED visit for cystitis.  Screening labs will be obtained.  He will be given IV fluids, morphine, ondansetron and will be sent for CT of abdomen and pelvis.  Labs show leukocytosis, urine appears infected. CT report is pending. Case is signed out to Dr. Clarene Duke.  Final Clinical Impressions(s) / ED Diagnoses   Final diagnoses:  Pyelonephritis  Hypokalemia  ED Discharge Orders    None       Dione Booze, MD 08/11/18 2246

## 2018-08-11 NOTE — ED Triage Notes (Signed)
Pt c/o right mid abd pain with 1 episode of vomiting yesterday. Pt reports fever of 102 at home.

## 2018-08-11 NOTE — H&P (Signed)
History and Physical    [name]:[id number] DOB: 05-08-1980 DOA: 08/11/2018  I have briefly reviewed the patient's prior medical records in [facility]  PCP: Julaine Fusi, NP  Patient coming from: home  Chief Complaint: Right flank pain  HPI: Isaiah Heath is a 38 y.o. male without significant medical history presents to the hospital with chief complaint of right flank pain for the past 3-4 days.  Patient has no significant medical history, however worth mentioning he was evaluated in April 2020 in the ED for left groin pain, hematuria, with a CT scan showing inflammation surrounding the inguinal canal of unknown significance, and ultrasound showed possible thrombosed varicocele.  Urology recommended warm compresses, Bactrim and follow-up in office.  Patient missed the follow-up appointment due to believing it was on a different day, and now it has been rescheduled at the end of this month.  He is not really bothered by his left inguinal area today.  Patient also has been complaining of fever and chills for the last couple of days, along with nausea but no vomiting or diarrhea.  He denies any chest pain, denies any shortness of breath.  He has no cough or chest congestion.  He denies any dysuria, burning with urination or increased frequency.  He also reports poor p.o. intake.  ED Course: In the ED patient was found to be febrile to 103, tachycardic to 111, blood work revealed a white count of 19 K. CT scan of the abdomen and pelvis was positive for right pyelonephritis without abscess or ureteral obstruction.  Urinalysis with evidence of a UTI.  He was given ceftriaxone, cultures were obtained, and we are asked to admit due to little to no p.o. intake and inability for oral meds  Review of Systems: As per HPI otherwise 10 point review of systems negative.   Past Medical History:  Diagnosis Date  . Anxiety   . Diverticulitis   . Inguinal hernia, left     Past Surgical  History:  Procedure Laterality Date  . HERNIA REPAIR Left [place], [place] [facility], Dr. Nelda Marseille  . INGUINAL HERNIA REPAIR Left 03/03/2016   Procedure: LEFT INGUNIAL HERNIA EXPLORATION;  Surgeon: Jimmye Norman, MD;  Location: [facility] OR;  Service: General;  Laterality: Left;  . PROSTATE BIOPSY     ok     reports that he has been smoking cigarettes. He has a 2.50 pack-year smoking history. He has never used smokeless tobacco. He reports that he does not drink alcohol or use drugs.  Allergies  Allergen Reactions  . Ciprofloxacin Hives    Family History  Problem Relation Age of Onset  . Diabetes Mother   . Hypertension Mother   . Stroke Father   . Hypertension Father   . Diabetes Maternal Grandmother   . Hypertension Maternal Grandfather   . Hypertension Paternal Grandfather     Prior to Admission medications   Medication Sig Start Date End Date Taking? Authorizing Provider  ibuprofen (ADVIL,MOTRIN) 200 MG tablet Take 400 mg by mouth every 6 (six) hours as needed.   Yes [provider]  oxyCODONE-acetaminophen (PERCOCET) 5-325 MG tablet Take 1-2 tablets by mouth every 6 (six) hours as needed. 06/30/18   Geoffery Lyons, MD    Physical Exam: Vitals:   08/11/18 0832 08/11/18 1047 08/11/18 1113 08/11/18 1201  BP:  135/77  128/73  Pulse: (!) 111 (!) 105  93  Resp:  16    Temp:  Marland Kitchen)  103 F (39.4 C) (!) 101.8 F (38.8 C) (!) 100.8 F (38.2 C)  TempSrc:  Oral  Oral  SpO2: 97% 96%  98%  Weight:      Height:       Constitutional: NAD, calm, comfortable Eyes: PERRL, lids and conjunctivae normal ENMT: Mucous membranes are moist. Neck: normal, supple Respiratory: clear to auscultation bilaterally, no wheezing, no crackles. Normal respiratory effort. No accessory muscle use.  Cardiovascular: Regular rate and rhythm, no murmurs / rubs / gallops. No extremity edema. 2+ pedal pulses.  Abdomen: Tender palpation right lower quadrant, no guarding or rebound.  Bowel  sounds positive.  Positive CVA tenderness on right Musculoskeletal: no clubbing / cyanosis. Skin: no rashes Neurologic: CN 2-12 grossly intact. Strength 5/5 in all 4.  Psychiatric: Normal judgment and insight. Alert and oriented x 3. Normal mood.   Labs on Admission: I have personally reviewed following labs and imaging studies  CBC: Recent Labs  Lab 08/11/18 0521  WBC 19.0*  NEUTROABS 17.0*  HGB 12.4*  HCT 40.1  MCV 92.2  PLT 157   Basic Metabolic Panel: Recent Labs  Lab 08/11/18 0521  NA 135  K 3.0*  CL 105  CO2 23  GLUCOSE 102*  BUN 9  CREATININE 0.75  CALCIUM 8.7*   GFR: Estimated Creatinine Clearance: 119.6 mL/min (by C-G formula based on SCr of 0.75 mg/dL). Liver Function Tests: Recent Labs  Lab 08/11/18 0521  AST 19  ALT 16  ALKPHOS 58  BILITOT 0.8  PROT 6.8  ALBUMIN 3.7   Recent Labs  Lab 08/11/18 0521  LIPASE 19   No results for input(s): AMMONIA in the last 168 hours. Coagulation Profile: No results for input(s): INR, PROTIME in the last 168 hours. Cardiac Enzymes: No results for input(s): CKTOTAL, CKMB, CKMBINDEX, TROPONINI in the last 168 hours. BNP (last 3 results) No results for input(s): PROBNP in the last 8760 hours. HbA1C: No results for input(s): HGBA1C in the last 72 hours. CBG: No results for input(s): GLUCAP in the last 168 hours. Lipid Profile: No results for input(s): CHOL, HDL, LDLCALC, TRIG, CHOLHDL, LDLDIRECT in the last 72 hours. Thyroid Function Tests: No results for input(s): TSH, T4TOTAL, FREET4, T3FREE, THYROIDAB in the last 72 hours. Anemia Panel: No results for input(s): VITAMINB12, FOLATE, FERRITIN, TIBC, IRON, RETICCTPCT in the last 72 hours. Urine analysis:    Component Value Date/Time   COLORURINE YELLOW 08/11/2018 0517   APPEARANCEUR CLOUDY (A) 08/11/2018 0517   LABSPEC 1.020 08/11/2018 0517   PHURINE 6.0 08/11/2018 0517   GLUCOSEU NEGATIVE 08/11/2018 0517   HGBUR LARGE (A) 08/11/2018 0517   BILIRUBINUR  NEGATIVE 08/11/2018 0517   BILIRUBINUR small 06/29/2018 1543   KETONESUR 15 (A) 08/11/2018 0517   PROTEINUR 100 (A) 08/11/2018 0517   UROBILINOGEN 0.2 06/29/2018 1543   UROBILINOGEN 0.2 07/24/2014 1332   NITRITE NEGATIVE 08/11/2018 0517   LEUKOCYTESUR SMALL (A) 08/11/2018 0517     Radiological Exams on Admission: Ct Abdomen Pelvis W Contrast  Result Date: 08/11/2018 CLINICAL DATA:  Right mid abdominal pain for 5 days.  Leukocytosis. EXAM: CT ABDOMEN AND PELVIS WITH CONTRAST TECHNIQUE: Multidetector CT imaging of the abdomen and pelvis was performed using the standard protocol following bolus administration of intravenous contrast. CONTRAST:  100mL OMNIPAQUE IOHEXOL 300 MG/ML  SOLN COMPARISON:  06/30/2018 FINDINGS: Lower chest:  No contributory findings. Hepatobiliary: No focal liver abnormality.No evidence of biliary obstruction or stone. Pancreas: Unremarkable. Spleen: Unremarkable. Adrenals/Urinary Tract: Negative adrenals. Patchy hypoenhancement of the right  kidney with perinephric edema. There is pelviectasis showing urothelial thickening. No evident urinary obstruction. Negative left kidney. There is enhancement of the right renal artery and vein. Stomach/Bowel: Appendix is difficult to visualize due to adjacent non-opacified small bowel loops. No evident right lower quadrant inflammation. No bowel obstruction. Vascular/Lymphatic: No acute vascular abnormality. No mass or adenopathy. Reproductive:Negative Other: No ascites or pneumoperitoneum. Musculoskeletal: Focal L5-S1 disc degeneration. IMPRESSION: Right pyelonephritis without abscess or ureteral obstruction. Electronically Signed   By: Marnee Spring M.D.   On: 08/11/2018 07:08    Assessment/Plan Active Problems:   Pyelonephritis   Principal Problem Sepsis due to pyelonephritis with intractable nausea and poor p.o. intake -Somewhat unusual in this young healthy male without significant comorbidities.  Treat with ceftriaxone,  cultures already sent and are pending. Monitor clincally -Pain control, antipyretics, slowly advance diet, antiemetics  Active Problems Tobacco use -counseled for cessation  DVT prophylaxis: Lovenox  Code Status: Full code  Family Communication: No family at bedside Disposition Plan: Home when ready Consults called: None   Pamella Pert, MD, PhD [facility]  Contact via [email or web address]  [facility] Office Info P: [phone]  F: [phone]   08/11/2018, 12:21 PM

## 2018-08-12 DIAGNOSIS — Z681 Body mass index (BMI) 19 or less, adult: Secondary | ICD-10-CM | POA: Diagnosis not present

## 2018-08-12 DIAGNOSIS — N12 Tubulo-interstitial nephritis, not specified as acute or chronic: Secondary | ICD-10-CM | POA: Diagnosis not present

## 2018-08-12 DIAGNOSIS — R7881 Bacteremia: Secondary | ICD-10-CM | POA: Diagnosis not present

## 2018-08-12 DIAGNOSIS — E876 Hypokalemia: Secondary | ICD-10-CM | POA: Diagnosis present

## 2018-08-12 DIAGNOSIS — Z885 Allergy status to narcotic agent status: Secondary | ICD-10-CM | POA: Diagnosis not present

## 2018-08-12 DIAGNOSIS — R630 Anorexia: Secondary | ICD-10-CM | POA: Diagnosis present

## 2018-08-12 DIAGNOSIS — A4151 Sepsis due to Escherichia coli [E. coli]: Secondary | ICD-10-CM | POA: Diagnosis present

## 2018-08-12 DIAGNOSIS — Z888 Allergy status to other drugs, medicaments and biological substances status: Secondary | ICD-10-CM | POA: Diagnosis not present

## 2018-08-12 DIAGNOSIS — Z79899 Other long term (current) drug therapy: Secondary | ICD-10-CM | POA: Diagnosis not present

## 2018-08-12 DIAGNOSIS — Z1159 Encounter for screening for other viral diseases: Secondary | ICD-10-CM | POA: Diagnosis not present

## 2018-08-12 DIAGNOSIS — F419 Anxiety disorder, unspecified: Secondary | ICD-10-CM | POA: Diagnosis present

## 2018-08-12 DIAGNOSIS — F17210 Nicotine dependence, cigarettes, uncomplicated: Secondary | ICD-10-CM | POA: Diagnosis present

## 2018-08-12 LAB — COMPREHENSIVE METABOLIC PANEL
ALT: 14 U/L (ref 0–44)
AST: 24 U/L (ref 15–41)
Albumin: 3.2 g/dL — ABNORMAL LOW (ref 3.5–5.0)
Alkaline Phosphatase: 82 U/L (ref 38–126)
Anion gap: 12 (ref 5–15)
BUN: 9 mg/dL (ref 6–20)
CO2: 21 mmol/L — ABNORMAL LOW (ref 22–32)
Calcium: 7.8 mg/dL — ABNORMAL LOW (ref 8.9–10.3)
Chloride: 103 mmol/L (ref 98–111)
Creatinine, Ser: 0.9 mg/dL (ref 0.61–1.24)
GFR calc Af Amer: >60 mL/min (ref >60)
GFR calc non Af Amer: >60 mL/min (ref >60)
Glucose, Bld: 80 mg/dL (ref 70–99)
Potassium: 3.9 mmol/L (ref 3.5–5.1)
Sodium: 136 mmol/L (ref 135–145)
Total Bilirubin: 1.2 mg/dL (ref 0.3–1.2)
Total Protein: 6.5 g/dL (ref 6.5–8.1)

## 2018-08-12 LAB — CBC
HCT: 37.6 % — ABNORMAL LOW (ref 39.0–52.0)
Hemoglobin: 11.4 g/dL — ABNORMAL LOW (ref 13.0–17.0)
MCH: 28.7 pg (ref 26.0–34.0)
MCHC: 30.3 g/dL (ref 30.0–36.0)
MCV: 94.7 fL (ref 80.0–100.0)
Platelets: 153 10*3/uL (ref 150–400)
RBC: 3.97 MIL/uL — ABNORMAL LOW (ref 4.22–5.81)
RDW: 13.3 % (ref 11.5–15.5)
WBC: 29.7 10*3/uL — ABNORMAL HIGH (ref 4.0–10.5)
nRBC: 0 % (ref 0.0–0.2)

## 2018-08-12 MED ORDER — IBUPROFEN 800 MG PO TABS
800.0000 mg | ORAL_TABLET | Freq: Four times a day (QID) | ORAL | Status: DC | PRN
  Administered 2018-08-12 – 2018-08-13 (×2): 800 mg via ORAL
  Filled 2018-08-12 (×2): qty 1

## 2018-08-12 MED ORDER — SUMATRIPTAN SUCCINATE 25 MG PO TABS
25.0000 mg | ORAL_TABLET | ORAL | Status: DC | PRN
  Filled 2018-08-12: qty 1

## 2018-08-12 MED ORDER — FENTANYL CITRATE (PF) 100 MCG/2ML IJ SOLN
25.0000 ug | Freq: Once | INTRAMUSCULAR | Status: AC
  Administered 2018-08-12: 25 ug via INTRAVENOUS
  Filled 2018-08-12: qty 2

## 2018-08-12 NOTE — Progress Notes (Signed)
PROGRESS NOTE    Isaiah Heath  NWG:[id number] DOB: 01/17/1981 DOA: 08/11/2018 PCP: Julaine Fusianford, Katy D, NP     Brief Narrative:  Isaiah Heath is a 38 y.o. male without significant medical history presents to the hospital with chief complaint of right flank pain for the past 3-4 days.  Patient has no significant medical history, however worth mentioning he was evaluated in April 2020 in the ED for left groin pain, hematuria, with a CT scan showing inflammation surrounding the inguinal canal of unknown significance, and ultrasound showed possible thrombosed varicocele.  Urology recommended warm compresses, Bactrim and follow-up in office.  Patient missed the follow-up appointment due to believing it was on a different day, and now it has been rescheduled at the end of this month.  He is not really bothered by his left inguinal area today.  Patient also has been complaining of fever and chills for the last couple of days, along with nausea but no vomiting or diarrhea.  He denies any chest pain, denies any shortness of breath.  He has no cough or chest congestion.  He denies any dysuria, burning with urination or increased frequency.  He also reports poor p.o. intake.  New events last 24 hours / Subjective: Continues to have a fever this morning 102.1, overall not feeling any better.  Continues to have right-sided flank pain.  Assessment & Plan:   Active Problems:   Pyelonephritis   Sepsis secondary to E. coli bacteremia, right-sided pyelonephritis -Sepsis present on admission -Urine culture showing greater than 100,000 gram-negative rods -Continue Rocephin  Tobacco abuse -Cessation counseling   DVT prophylaxis: Lovenox Code Status: Full Family Communication: Discussed with wife over the phone at patient's request Disposition Plan: Pending improvement in sepsis pathology.  Patient meets inpatient criteria due to gram-negative rod bacteremia, continue sepsis pathology with fever 102.1,  tachycardia with worsening leukocytosis 29.7.  Continue IV antibiotics   Consultants:   None  Procedures:   None  Antimicrobials:  Anti-infectives (From admission, onward)   Start     Dose/Rate Route Frequency Ordered Stop   08/12/18 0600  cefTRIAXone (ROCEPHIN) 1 g in sodium chloride 0.9 % 100 mL IVPB  Status:  Discontinued     1 g 200 mL/hr over 30 Minutes Intravenous  Once 08/11/18 1207 08/11/18 1227   08/12/18 0600  cefTRIAXone (ROCEPHIN) 2 g in sodium chloride 0.9 % 100 mL IVPB     2 g 200 mL/hr over 30 Minutes Intravenous Every 24 hours 08/11/18 1227     08/11/18 1300  cefTRIAXone (ROCEPHIN) 1 g in sodium chloride 0.9 % 100 mL IVPB     1 g 200 mL/hr over 30 Minutes Intravenous  Once 08/11/18 1227 08/11/18 1445   08/11/18 0730  cefTRIAXone (ROCEPHIN) 1 g in sodium chloride 0.9 % 100 mL IVPB     1 g 200 mL/hr over 30 Minutes Intravenous  Once 08/11/18 0715 08/11/18 0834       Objective: Vitals:   08/11/18 2011 08/11/18 2100 08/12/18 0636 08/12/18 0752  BP: 108/80  119/63   Pulse: (!) 106  (!) 103   Resp: 14  14   Temp: 99 F (37.2 C) 99.6 F (37.6 C) (!) 102.1 F (38.9 C) 99 F (37.2 C)  TempSrc: Oral Oral Oral Oral  SpO2: 100%  95%   Weight:      Height:        Intake/Output Summary (Last 24 hours) at 08/12/2018 1342 Last data filed at 08/12/2018 1235  Gross per 24 hour  Intake 2045.6 ml  Output 2100 ml  Net -54.4 ml   Filed Weights   08/11/18 0504 08/11/18 1201  Weight: 68.9 kg 69.1 kg    Examination:  General exam: Appears calm and comfortable  Respiratory system: Clear to auscultation. Respiratory effort normal. Cardiovascular system: S1 & S2 heard, RRR. No JVD, murmurs, rubs, gallops or clicks. No pedal edema. Gastrointestinal system: Abdomen is nondistended, soft and nontender. No organomegaly or masses felt. Normal bowel sounds heard. Central nervous system: Alert and oriented. No focal neurological deficits. Extremities: Symmetric 5 x 5 power.  Skin: No rashes, lesions or ulcers Psychiatry: Judgement and insight appear normal. Mood & affect appropriate.   Data Reviewed: I have personally reviewed following labs and imaging studies  CBC: Recent Labs  Lab 08/11/18 0521 08/12/18 0430  WBC 19.0* 29.7*  NEUTROABS 17.0*  --   HGB 12.4* 11.4*  HCT 40.1 37.6*  MCV 92.2 94.7  PLT 157 153   Basic Metabolic Panel: Recent Labs  Lab 08/11/18 0521 08/12/18 0430  NA 135 136  K 3.0* 3.9  CL 105 103  CO2 23 21*  GLUCOSE 102* 80  BUN 9 9  CREATININE 0.75 0.90  CALCIUM 8.7* 7.8*   GFR: Estimated Creatinine Clearance: 106.6 mL/min (by C-G formula based on SCr of 0.9 mg/dL). Liver Function Tests: Recent Labs  Lab 08/11/18 0521 08/12/18 0430  AST 19 24  ALT 16 14  ALKPHOS 58 82  BILITOT 0.8 1.2  PROT 6.8 6.5  ALBUMIN 3.7 3.2*   Recent Labs  Lab 08/11/18 0521  LIPASE 19   No results for input(s): AMMONIA in the last 168 hours. Coagulation Profile: No results for input(s): INR, PROTIME in the last 168 hours. Cardiac Enzymes: No results for input(s): CKTOTAL, CKMB, CKMBINDEX, TROPONINI in the last 168 hours. BNP (last 3 results) No results for input(s): PROBNP in the last 8760 hours. HbA1C: No results for input(s): HGBA1C in the last 72 hours. CBG: No results for input(s): GLUCAP in the last 168 hours. Lipid Profile: No results for input(s): CHOL, HDL, LDLCALC, TRIG, CHOLHDL, LDLDIRECT in the last 72 hours. Thyroid Function Tests: No results for input(s): TSH, T4TOTAL, FREET4, T3FREE, THYROIDAB in the last 72 hours. Anemia Panel: No results for input(s): VITAMINB12, FOLATE, FERRITIN, TIBC, IRON, RETICCTPCT in the last 72 hours. Sepsis Labs: No results for input(s): PROCALCITON, LATICACIDVEN in the last 168 hours.  Recent Results (from the past 240 hour(s))  Culture, blood (routine x 2)     Status: Abnormal (Preliminary result)   Collection Time: 08/11/18  5:16 AM  Result Value Ref Range Status   Specimen  Description   Final    BLOOD RIGHT FOREARM Performed at [facility], [address] [place], [place] [address]    Special Requests   Final    BOTTLES DRAWN AEROBIC AND ANAEROBIC Blood Culture adequate volume Performed at [facility], [address] [place], [place] [address]    Culture  Setup Time   Final    GRAM NEGATIVE RODS IN BOTH AEROBIC AND ANAEROBIC BOTTLES Organism ID to follow CRITICAL RESULT CALLED TO, READ BACK BY AND VERIFIED WITH: Peggyann Juba [facility] 08/11/18 2321    Culture (A)  Final    ESCHERICHIA COLI SUSCEPTIBILITIES TO FOLLOW Performed at [facility], [address], [place], [place] [address]    Report Status PENDING  Incomplete  Blood Culture ID Panel (Reflexed)  Status: Abnormal   Collection Time: 08/11/18  5:16 AM  Result Value Ref Range Status   Enterococcus species NOT DETECTED NOT DETECTED Final   Listeria monocytogenes NOT DETECTED NOT DETECTED Final   Staphylococcus species NOT DETECTED NOT DETECTED Final   Staphylococcus aureus (BCID) NOT DETECTED NOT DETECTED Final   Streptococcus species NOT DETECTED NOT DETECTED Final   Streptococcus agalactiae NOT DETECTED NOT DETECTED Final   Streptococcus pneumoniae NOT DETECTED NOT DETECTED Final   Streptococcus pyogenes NOT DETECTED NOT DETECTED Final   Acinetobacter baumannii NOT DETECTED NOT DETECTED Final   Enterobacteriaceae species DETECTED (A) NOT DETECTED Final    Comment: Enterobacteriaceae represent a large family of gram-negative bacteria, not a single organism. CRITICAL RESULT CALLED TO, READ BACK BY AND VERIFIED WITH: J GRIMSLEY PHARMD 08/11/18 2321 JDW    Enterobacter cloacae complex NOT DETECTED NOT DETECTED Final   Escherichia coli DETECTED (A) NOT DETECTED Final    Comment: CRITICAL RESULT CALLED TO, READ BACK BY AND VERIFIED WITH: J GRIMSLEY PHARMD 08/11/18 JDW    Klebsiella oxytoca NOT DETECTED NOT DETECTED Final   Klebsiella pneumoniae NOT DETECTED NOT  DETECTED Final   Proteus species NOT DETECTED NOT DETECTED Final   Serratia marcescens NOT DETECTED NOT DETECTED Final   Carbapenem resistance NOT DETECTED NOT DETECTED Final   Haemophilus influenzae NOT DETECTED NOT DETECTED Final   Neisseria meningitidis NOT DETECTED NOT DETECTED Final   Pseudomonas aeruginosa NOT DETECTED NOT DETECTED Final   Candida albicans NOT DETECTED NOT DETECTED Final   Candida glabrata NOT DETECTED NOT DETECTED Final   Candida krusei NOT DETECTED NOT DETECTED Final   Candida parapsilosis NOT DETECTED NOT DETECTED Final   Candida tropicalis NOT DETECTED NOT DETECTED Final    Comment: Performed at [facility], [address], [place], [place] [address]  Urine culture     Status: Abnormal (Preliminary result)   Collection Time: 08/11/18  5:17 AM  Result Value Ref Range Status   Specimen Description   Final    URINE, CLEAN CATCH Performed at [facility], [address] [place], [place] [address]    Special Requests   Final    NONE Performed at [facility], [address] [place], [place] [address]    Culture >=100,000 COLONIES/mL GRAM NEGATIVE RODS (A)  Final   Report Status PENDING  Incomplete  SARS Coronavirus 2 (Hosp order,Performed in [facility] lab via [employer] [facility])     Status: None   Collection Time: 08/11/18  5:22 AM  Result Value Ref Range Status   SARS Coronavirus 2 ([employer] Now) NEGATIVE NEGATIVE Final    Comment: (NOTE) Interpretive Result Comment(s): COVID 19 Positive SARS CoV 2 target nucleic acids are DETECTED. The SARS CoV 2 RNA is generally detectable in upper and lower respiratory specimens during the acute phase of infection.  Positive results are indicative of active infection with SARS CoV 2.  Clinical correlation with patient history and other diagnostic information is necessary to determine patient infection status.  Positive results do not rule out bacterial infection or coinfection with other  viruses. The expected result is Negative. COVID 19 Negative SARS CoV 2 target nucleic acids are NOT DETECTED. The SARS CoV 2 RNA is generally detectable in upper and lower respiratory specimens during the acute phase of infection.  Negative results do not preclude SARS CoV 2 infection, do not rule out coinfections with other pathogens, and should not be used as the  sole basis for treatment or other patient management decisions.  Negative results must be combined with clinical  observations, patient history, and epidemiological information. The expected result is Negative. Invalid Presence or absence of SARS CoV 2 nucleic acids cannot be determined. Repeat testing was performed on the submitted specimen and repeated Invalid results were obtained.  If clinically indicated, additional testing on a new specimen with an alternate test methodology [phone]) is advised.  The SARS CoV 2 RNA is generally detectable in upper and lower respiratory specimens during the acute phase of infection. The expected result is Negative. Fact Sheet for Patients:  http://www.graves-ford.org/ Fact Sheet for Healthcare Providers: EnviroConcern.si This test is not yet approved or cleared by the [place] FDA and has been authorized for detection and/or diagnosis of SARS CoV 2 by FDA under an Emergency Use Authorization (EUA).  This EUA will remain in effect (meaning this test can be used) for the duration of the COVID19 d eclaration under Section 564(b)(1) of the Act, 21 U.S.C. section [phone] 3(b)(1), unless the authorization is terminated or revoked sooner. Performed at [facility], [address] [place], [place] [address]   Culture, blood (routine x 2)     Status: None (Preliminary result)   Collection Time: 08/11/18  7:34 AM  Result Value Ref Range Status   Specimen Description   Final    BLOOD LEFT ANTECUBITAL Performed at [facility], [address] [place], [place] [address]    Special Requests   Final    BOTTLES DRAWN AEROBIC AND ANAEROBIC Blood Culture adequate volume Performed at [facility], [address] [place], [place] [address]    Culture  Setup Time   Final    GRAM NEGATIVE RODS AEROBIC BOTTLE ONLY CRITICAL RESULT CALLED TO, READ BACK BY AND VERIFIED WITH: Trixie Deis [id number] 08/12/2018 Girtha Hake Performed at [facility], [address], [place], [place] [address]    Culture GRAM NEGATIVE RODS  Final   Report Status PENDING  Incomplete  SARS Coronavirus 2 (CEPHEID - Performed in [facility] hospital lab), Hosp Order     Status: None   Collection Time: 08/11/18 10:51 AM  Result Value Ref Range Status   SARS Coronavirus 2 NEGATIVE NEGATIVE Final    Comment: (NOTE) If result is NEGATIVE SARS-CoV-2 target nucleic acids are NOT DETECTED. The SARS-CoV-2 RNA is generally detectable in upper and lower  respiratory specimens during the acute phase of infection. The lowest  concentration of SARS-CoV-2 viral copies this assay can detect is 250  copies / mL. A negative result does not preclude SARS-CoV-2 infection  and should not be used as the sole basis for treatment or other  patient management decisions.  A negative result may occur with  improper specimen collection / handling, submission of specimen other  than nasopharyngeal swab, presence of viral mutation(s) within the  areas targeted by this assay, and inadequate number of viral copies  (<250 copies / mL). A negative result must be combined with clinical  observations, patient history, and epidemiological information. If result is POSITIVE SARS-CoV-2 target nucleic acids are DETECTED. The SARS-CoV-2 RNA is generally detectable in upper and lower  respiratory specimens dur ing the acute phase of infection.  Positive  results are indicative of active infection with SARS-CoV-2.  Clinical  correlation with patient history  and other diagnostic information is  necessary to determine patient infection status.  Positive results do  not rule  out bacterial infection or co-infection with other viruses. If result is PRESUMPTIVE POSTIVE SARS-CoV-2 nucleic acids MAY BE PRESENT.   A presumptive positive result was obtained on the submitted specimen  and confirmed on repeat testing.  While 2019 novel coronavirus  (SARS-CoV-2) nucleic acids may be present in the submitted sample  additional confirmatory testing may be necessary for epidemiological  and / or clinical management purposes  to differentiate between  SARS-CoV-2 and other Sarbecovirus currently known to infect humans.  If clinically indicated additional testing with an alternate test  methodology [phone]) is advised. The SARS-CoV-2 RNA is generally  detectable in upper and lower respiratory sp ecimens during the acute  phase of infection. The expected result is Negative. Fact Sheet for Patients:  BoilerBrush.com.cy Fact Sheet for Healthcare Providers: https://pope.com/ This test is not yet approved or cleared by the [place] FDA and has been authorized for detection and/or diagnosis of SARS-CoV-2 by FDA under an Emergency Use Authorization (EUA).  This EUA will remain in effect (meaning this test can be used) for the duration of the COVID-19 declaration under Section 564(b)(1) of the Act, 21 U.S.C. section 360bbb-3(b)(1), unless the authorization is terminated or revoked sooner. Performed at [facility], [address], [place], [place] [address]        Radiology Studies: Ct Abdomen Pelvis W Contrast  Result Date: 08/11/2018 CLINICAL DATA:  Right mid abdominal pain for 5 days.  Leukocytosis. EXAM: CT ABDOMEN AND PELVIS WITH CONTRAST TECHNIQUE: Multidetector CT imaging of the abdomen and pelvis was performed using the standard protocol following bolus administration of intravenous contrast.  CONTRAST:  OMNIPAQUE IOHEXOL 300 MG/ML  SOLN COMPARISON:  06/30/2018 FINDINGS: Lower chest:  No contributory findings. Hepatobiliary: No focal liver abnormality.No evidence of biliary obstruction or stone. Pancreas: Unremarkable. Spleen: Unremarkable. Adrenals/Urinary Tract: Negative adrenals. Patchy hypoenhancement of the right kidney with perinephric edema. There is pelviectasis showing urothelial thickening. No evident urinary obstruction. Negative left kidney. There is enhancement of the right renal artery and vein. Stomach/Bowel: Appendix is difficult to visualize due to adjacent non-opacified small bowel loops. No evident right lower quadrant inflammation. No bowel obstruction. Vascular/Lymphatic: No acute vascular abnormality. No mass or adenopathy. Reproductive:Negative Other: No ascites or pneumoperitoneum. Musculoskeletal: Focal L5-S1 disc degeneration. IMPRESSION: Right pyelonephritis without abscess or ureteral obstruction. Electronically Signed   By: Marnee Spring M.D.   On: 08/11/2018 07:08      Scheduled Meds: . enoxaparin (LOVENOX) injection  40 mg Subcutaneous Q24H   Continuous Infusions: . sodium chloride 100 mL/hr at 08/12/18 0759  . cefTRIAXone (ROCEPHIN)  IV 2 g (08/12/18 0519)     LOS: 0 days    Time spent: 35 minutes   Noralee Stain, DO [facility] [email or web address] 08/12/2018, 1:42 PM

## 2018-08-12 NOTE — Progress Notes (Signed)
PHARMACY - PHYSICIAN COMMUNICATION CRITICAL VALUE ALERT - BLOOD CULTURE IDENTIFICATION (BCID)  Isaiah Heath is an 38 y.o. male who presented to [facility] on 08/11/2018 with a chief complaint of Right Flank Pain  Assessment:  Patient with sepsis due to pyelonephritis on Ceftriaxone 2gm iv q24hr.  BCID shows E.Coli ([facility]-). (include suspected source if known)  Name of physician (or Provider) Contacted: None  Current antibiotics: Ceftriaxone 2gm iv q24hr  Changes to prescribed antibiotics recommended:  Patient is on recommended antibiotics - No changes needed  Results for orders placed or performed during the hospital encounter of 08/11/18  Blood Culture ID Panel (Reflexed) (Collected: 08/11/2018  5:16 AM)  Result Value Ref Range   Enterococcus species NOT DETECTED NOT DETECTED   Listeria monocytogenes NOT DETECTED NOT DETECTED   Staphylococcus species NOT DETECTED NOT DETECTED   Staphylococcus aureus (BCID) NOT DETECTED NOT DETECTED   Streptococcus species NOT DETECTED NOT DETECTED   Streptococcus agalactiae NOT DETECTED NOT DETECTED   Streptococcus pneumoniae NOT DETECTED NOT DETECTED   Streptococcus pyogenes NOT DETECTED NOT DETECTED   Acinetobacter baumannii NOT DETECTED NOT DETECTED   Enterobacteriaceae species DETECTED (A) NOT DETECTED   Enterobacter cloacae complex NOT DETECTED NOT DETECTED   Escherichia coli DETECTED (A) NOT DETECTED   Klebsiella oxytoca NOT DETECTED NOT DETECTED   Klebsiella pneumoniae NOT DETECTED NOT DETECTED   Proteus species NOT DETECTED NOT DETECTED   Serratia marcescens NOT DETECTED NOT DETECTED   Carbapenem resistance NOT DETECTED NOT DETECTED   Haemophilus influenzae NOT DETECTED NOT DETECTED   Neisseria meningitidis NOT DETECTED NOT DETECTED   Pseudomonas aeruginosa NOT DETECTED NOT DETECTED   Candida albicans NOT DETECTED NOT DETECTED   Candida glabrata NOT DETECTED NOT DETECTED   Candida krusei NOT DETECTED NOT DETECTED   Candida  parapsilosis NOT DETECTED NOT DETECTED   Candida tropicalis NOT DETECTED NOT DETECTED    Aleene Davidson Crowford 08/12/2018  12:38 AM

## 2018-08-13 DIAGNOSIS — R7881 Bacteremia: Secondary | ICD-10-CM

## 2018-08-13 LAB — URINE CULTURE: Culture: >=100000 — AB

## 2018-08-13 LAB — CULTURE, BLOOD (ROUTINE X 2): Special Requests: ADEQUATE

## 2018-08-13 LAB — BASIC METABOLIC PANEL
Anion gap: 7 (ref 5–15)
BUN: 11 mg/dL (ref 6–20)
CO2: 26 mmol/L (ref 22–32)
Calcium: 8.5 mg/dL — ABNORMAL LOW (ref 8.9–10.3)
Chloride: 104 mmol/L (ref 98–111)
Creatinine, Ser: 0.9 mg/dL (ref 0.61–1.24)
GFR calc Af Amer: >60 mL/min (ref >60)
GFR calc non Af Amer: >60 mL/min (ref >60)
Glucose, Bld: 110 mg/dL — ABNORMAL HIGH (ref 70–99)
Potassium: 2.9 mmol/L — ABNORMAL LOW (ref 3.5–5.1)
Sodium: 137 mmol/L (ref 135–145)

## 2018-08-13 LAB — MAGNESIUM: Magnesium: 1.9 mg/dL (ref 1.7–2.4)

## 2018-08-13 LAB — CBC
HCT: 35.2 % — ABNORMAL LOW (ref 39.0–52.0)
Hemoglobin: 11.1 g/dL — ABNORMAL LOW (ref 13.0–17.0)
MCH: 29.1 pg (ref 26.0–34.0)
MCHC: 31.5 g/dL (ref 30.0–36.0)
MCV: 92.1 fL (ref 80.0–100.0)
Platelets: 144 10*3/uL — ABNORMAL LOW (ref 150–400)
RBC: 3.82 MIL/uL — ABNORMAL LOW (ref 4.22–5.81)
RDW: 13.2 % (ref 11.5–15.5)
WBC: 20.2 10*3/uL — ABNORMAL HIGH (ref 4.0–10.5)
nRBC: 0 % (ref 0.0–0.2)

## 2018-08-13 MED ORDER — POTASSIUM CHLORIDE CRYS ER 20 MEQ PO TBCR
40.0000 meq | EXTENDED_RELEASE_TABLET | ORAL | Status: AC
  Administered 2018-08-13 (×2): 40 meq via ORAL
  Filled 2018-08-13 (×2): qty 2

## 2018-08-13 NOTE — Progress Notes (Addendum)
PROGRESS NOTE    Isaiah Heath  QRF:[id number] DOB: 03/31/1980 DOA: 08/11/2018 PCP: Julaine Fusi, NP     Brief Narrative:  Isaiah Heath is a 38 y.o. male without significant medical history presents to the hospital with chief complaint of right flank pain for the past 3-4 days.  Patient has no significant medical history, however worth mentioning he was evaluated in April 2020 in the ED for left groin pain, hematuria, with a CT scan showing inflammation surrounding the inguinal canal of unknown significance, and ultrasound showed possible thrombosed varicocele.  Urology recommended warm compresses, Bactrim and follow-up in office.  Patient missed the follow-up appointment due to believing it was on a different day, and now it has been rescheduled at the end of this month.  He is not really bothered by his left inguinal area today.  Patient also has been complaining of fever and chills for the last couple of days, along with nausea but no vomiting or diarrhea.  He denies any chest pain, denies any shortness of breath.  He has no cough or chest congestion.  He denies any dysuria, burning with urination or increased frequency.  He also reports poor p.o. intake.  New events last 24 hours / Subjective: Continues to have pain in his right flank, headache is much improved today after receiving ibuprofen.  Low-grade fevers overnight.  Assessment & Plan:   Active Problems:   Pyelonephritis   Sepsis secondary to E. coli bacteremia, right-sided pyelonephritis -Sepsis present on admission -Urine culture showing E coli sensitive to cephalosporin  -Continue Rocephin  Tobacco abuse -Cessation counseling  Hypokalemia -Replace, trend, magnesium normal   DVT prophylaxis: Lovenox Code Status: Full Family Communication: Discussed with wife over the phone today  Disposition Plan: Pending improvement in sepsis pathology, improvement in leukocytosis, fever curve. Hopeful discharge home 5/25     Consultants:   None  Procedures:   None  Antimicrobials:  Anti-infectives (From admission, onward)   Start     Dose/Rate Route Frequency Ordered Stop   08/12/18 0600  cefTRIAXone (ROCEPHIN) 1 g in sodium chloride 0.9 % 100 mL IVPB  Status:  Discontinued     1 g 200 mL/hr over 30 Minutes Intravenous  Once 08/11/18 1207 08/11/18 1227   08/12/18 0600  cefTRIAXone (ROCEPHIN) 2 g in sodium chloride 0.9 % 100 mL IVPB     2 g 200 mL/hr over 30 Minutes Intravenous Every 24 hours 08/11/18 1227     08/11/18 1300  cefTRIAXone (ROCEPHIN) 1 g in sodium chloride 0.9 % 100 mL IVPB     1 g 200 mL/hr over 30 Minutes Intravenous  Once 08/11/18 1227 08/11/18 1445   08/11/18 0730  cefTRIAXone (ROCEPHIN) 1 g in sodium chloride 0.9 % 100 mL IVPB     1 g 200 mL/hr over 30 Minutes Intravenous  Once 08/11/18 0715 08/11/18 0834       Objective: Vitals:   08/12/18 1359 08/12/18 2114 08/13/18 0515 08/13/18 0635  BP: 131/65 127/76 119/73   Pulse: 88 84 77   Resp:  18 18   Temp: 100.2 F (37.9 C) 99 F (37.2 C) 98.5 F (36.9 C) 98.7 F (37.1 C)  TempSrc: Oral  Oral Oral  SpO2: 97% 95% 96%   Weight:      Height:        Intake/Output Summary (Last 24 hours) at 08/13/2018 1024 Last data filed at 08/13/2018 0746 Gross per 24 hour  Intake 1713.83 ml  Output 2900 ml  Net -1186.17 ml   Filed Weights   08/11/18 0504 08/11/18 1201  Weight: 68.9 kg 69.1 kg    Examination: General exam: Appears calm and comfortable, fatigued   Respiratory system: Clear to auscultation. Respiratory effort normal. Cardiovascular system: S1 & S2 heard, RRR. No JVD, murmurs, rubs, gallops or clicks. No pedal edema. Gastrointestinal system: Abdomen is nondistended, soft and nontender. No organomegaly or masses felt. Normal bowel sounds heard. Central nervous system: Alert and oriented. No focal neurological deficits. Extremities: Symmetric 5 x 5 power. Skin: No rashes, lesions or ulcers Psychiatry: Judgement and  insight appear normal. Mood & affect appropriate.     Data Reviewed: I have personally reviewed following labs and imaging studies  CBC: Recent Labs  Lab 08/11/18 0521 08/12/18 0430 08/13/18 0355  WBC 19.0* 29.7* 20.2*  NEUTROABS 17.0*  --   --   HGB 12.4* 11.4* 11.1*  HCT 40.1 37.6* 35.2*  MCV 92.2 94.7 92.1  PLT 157 153 144*   Basic Metabolic Panel: Recent Labs  Lab 08/11/18 0521 08/12/18 0430 08/13/18 0355 08/13/18 0400  NA 135 136 137  --   K 3.0* 3.9 2.9*  --   CL 105 103 104  --   CO2 23 21* 26  --   GLUCOSE 102* 80 110*  --   BUN --   CREATININE 0.75 0.90 0.90  --   CALCIUM 8.7* 7.8* 8.5*  --   MG  --   --   --  1.9   GFR: Estimated Creatinine Clearance: 106.6 mL/min (by C-G formula based on SCr of 0.9 mg/dL). Liver Function Tests: Recent Labs  Lab 08/11/18 0521 08/12/18 0430  AST 19 24  ALT 16 14  ALKPHOS 58 82  BILITOT 0.8 1.2  PROT 6.8 6.5  ALBUMIN 3.7 3.2*   Recent Labs  Lab 08/11/18 0521  LIPASE 19   No results for input(s): AMMONIA in the last 168 hours. Coagulation Profile: No results for input(s): INR, PROTIME in the last 168 hours. Cardiac Enzymes: No results for input(s): CKTOTAL, CKMB, CKMBINDEX, TROPONINI in the last 168 hours. BNP (last 3 results) No results for input(s): PROBNP in the last 8760 hours. HbA1C: No results for input(s): HGBA1C in the last 72 hours. CBG: No results for input(s): GLUCAP in the last 168 hours. Lipid Profile: No results for input(s): CHOL, HDL, LDLCALC, TRIG, CHOLHDL, LDLDIRECT in the last 72 hours. Thyroid Function Tests: No results for input(s): TSH, T4TOTAL, FREET4, T3FREE, THYROIDAB in the last 72 hours. Anemia Panel: No results for input(s): VITAMINB12, FOLATE, FERRITIN, TIBC, IRON, RETICCTPCT in the last 72 hours. Sepsis Labs: No results for input(s): PROCALCITON, LATICACIDVEN in the last 168 hours.  Recent Results (from the past 240 hour(s))  Culture, blood (routine x 2)      Status: Abnormal   Collection Time: 08/11/18  5:16 AM  Result Value Ref Range Status   Specimen Description   Final    BLOOD RIGHT FOREARM Performed at [facility], [address] [place], [place] [address]    Special Requests   Final    BOTTLES DRAWN AEROBIC AND ANAEROBIC Blood Culture adequate volume Performed at [facility], [address] [place], [place] [address]    Culture  Setup Time   Final    GRAM NEGATIVE RODS IN BOTH AEROBIC AND ANAEROBIC BOTTLES Organism ID to follow CRITICAL RESULT CALLED TO, READ BACK BY AND VERIFIED WITH: Peggyann Juba [facility] 08/11/18  2321 Performed at [facility], [address] [place], [place] [address]    Culture ESCHERICHIA COLI (A)  Final   Report Status 08/13/2018 FINAL  Final   Organism ID, Bacteria ESCHERICHIA COLI  Final      Susceptibility   Escherichia coli - MIC*    AMPICILLIN >=32 RESISTANT Resistant     CEFAZOLIN 16 SENSITIVE Sensitive     CEFEPIME <=1 SENSITIVE Sensitive     CEFTAZIDIME <=1 SENSITIVE Sensitive     CEFTRIAXONE <=1 SENSITIVE Sensitive     CIPROFLOXACIN <=0.25 SENSITIVE Sensitive     GENTAMICIN <=1 SENSITIVE Sensitive     IMIPENEM <=0.25 SENSITIVE Sensitive     TRIMETH/SULFA <=20 SENSITIVE Sensitive     AMPICILLIN/SULBACTAM >=32 RESISTANT Resistant     PIP/TAZO <=4 SENSITIVE Sensitive     Extended ESBL NEGATIVE Sensitive     * ESCHERICHIA COLI  Blood Culture ID Panel (Reflexed)     Status: Abnormal   Collection Time: 08/11/18  5:16 AM  Result Value Ref Range Status   Enterococcus species NOT DETECTED NOT DETECTED Final   Listeria monocytogenes NOT DETECTED NOT DETECTED Final   Staphylococcus species NOT DETECTED NOT DETECTED Final   Staphylococcus aureus (BCID) NOT DETECTED NOT DETECTED Final   Streptococcus species NOT DETECTED NOT DETECTED Final   Streptococcus agalactiae NOT DETECTED NOT DETECTED Final   Streptococcus pneumoniae NOT DETECTED NOT DETECTED Final   Streptococcus  pyogenes NOT DETECTED NOT DETECTED Final   Acinetobacter baumannii NOT DETECTED NOT DETECTED Final   Enterobacteriaceae species DETECTED (A) NOT DETECTED Final    Comment: Enterobacteriaceae represent a large family of gram-negative bacteria, not a single organism. CRITICAL RESULT CALLED TO, READ BACK BY AND VERIFIED WITH: J GRIMSLEY PHARMD 08/11/18 2321 JDW    Enterobacter cloacae complex NOT DETECTED NOT DETECTED Final   Escherichia coli DETECTED (A) NOT DETECTED Final    Comment: CRITICAL RESULT CALLED TO, READ BACK BY AND VERIFIED WITH: J GRIMSLEY PHARMD 08/11/18 JDW    Klebsiella oxytoca NOT DETECTED NOT DETECTED Final   Klebsiella pneumoniae NOT DETECTED NOT DETECTED Final   Proteus species NOT DETECTED NOT DETECTED Final   Serratia marcescens NOT DETECTED NOT DETECTED Final   Carbapenem resistance NOT DETECTED NOT DETECTED Final   Haemophilus influenzae NOT DETECTED NOT DETECTED Final   Neisseria meningitidis NOT DETECTED NOT DETECTED Final   Pseudomonas aeruginosa NOT DETECTED NOT DETECTED Final   Candida albicans NOT DETECTED NOT DETECTED Final   Candida glabrata NOT DETECTED NOT DETECTED Final   Candida krusei NOT DETECTED NOT DETECTED Final   Candida parapsilosis NOT DETECTED NOT DETECTED Final   Candida tropicalis NOT DETECTED NOT DETECTED Final    Comment: Performed at [facility], [address] [place], [place] [address]  Urine culture     Status: Abnormal   Collection Time: 08/11/18  5:17 AM  Result Value Ref Range Status   Specimen Description   Final    URINE, CLEAN CATCH Performed at [facility], [address] [place], [place] [address]    Special Requests   Final    NONE Performed at [facility], [address] [place], [place] [address]    Culture >=100,000 COLONIES/mL ESCHERICHIA COLI (A)  Final   Report Status 08/13/2018 FINAL  Final   Organism ID, Bacteria ESCHERICHIA COLI (A)  Final      Susceptibility   Escherichia  coli - MIC*    AMPICILLIN >=  32 RESISTANT Resistant     CEFAZOLIN 16 SENSITIVE Sensitive     CEFTRIAXONE <=1 SENSITIVE Sensitive     CIPROFLOXACIN <=0.25 SENSITIVE Sensitive     GENTAMICIN <=1 SENSITIVE Sensitive     IMIPENEM <=0.25 SENSITIVE Sensitive     NITROFURANTOIN <=16 SENSITIVE Sensitive     TRIMETH/SULFA <=20 SENSITIVE Sensitive     AMPICILLIN/SULBACTAM >=32 RESISTANT Resistant     PIP/TAZO <=4 SENSITIVE Sensitive     Extended ESBL NEGATIVE Sensitive     * >=100,000 COLONIES/mL ESCHERICHIA COLI  SARS Coronavirus 2 (Hosp order,Performed in [facility] lab via [employer] [facility])     Status: None   Collection Time: 08/11/18  5:22 AM  Result Value Ref Range Status   SARS Coronavirus 2 ([employer] Now) NEGATIVE NEGATIVE Final    Comment: (NOTE) Interpretive Result Comment(s): COVID 19 Positive SARS CoV 2 target nucleic acids are DETECTED. The SARS CoV 2 RNA is generally detectable in upper and lower respiratory specimens during the acute phase of infection.  Positive results are indicative of active infection with SARS CoV 2.  Clinical correlation with patient history and other diagnostic information is necessary to determine patient infection status.  Positive results do not rule out bacterial infection or coinfection with other viruses. The expected result is Negative. COVID 19 Negative SARS CoV 2 target nucleic acids are NOT DETECTED. The SARS CoV 2 RNA is generally detectable in upper and lower respiratory specimens during the acute phase of infection.  Negative results do not preclude SARS CoV 2 infection, do not rule out coinfections with other pathogens, and should not be used as the sole basis for treatment or other patient management decisions.  Negative results must be combined with clinical  observations, patient history, and epidemiological information. The expected result is Negative. Invalid Presence or absence of SARS CoV 2 nucleic acids cannot be determined. Repeat  testing was performed on the submitted specimen and repeated Invalid results were obtained.  If clinically indicated, additional testing on a new specimen with an alternate test methodology [phone]) is advised.  The SARS CoV 2 RNA is generally detectable in upper and lower respiratory specimens during the acute phase of infection. The expected result is Negative. Fact Sheet for Patients:  http://www.graves-ford.org/ Fact Sheet for Healthcare Providers: EnviroConcern.si This test is not yet approved or cleared by the [place] FDA and has been authorized for detection and/or diagnosis of SARS CoV 2 by FDA under an Emergency Use Authorization (EUA).  This EUA will remain in effect (meaning this test can be used) for the duration of the COVID19 d eclaration under Section 564(b)(1) of the Act, 21 U.S.C. section 8196533993 3(b)(1), unless the authorization is terminated or revoked sooner. Performed at [facility], [address] [place], [place] [address]   Culture, blood (routine x 2)     Status: None (Preliminary result)   Collection Time: 08/11/18  7:34 AM  Result Value Ref Range Status   Specimen Description   Final    BLOOD LEFT ANTECUBITAL Performed at [facility], [address], [place], [place] [address]    Special Requests   Final    BOTTLES DRAWN AEROBIC AND ANAEROBIC Blood Culture adequate volume Performed at [facility], [address] [place], [place] [address]    Culture  Setup Time   Final    GRAM NEGATIVE RODS AEROBIC BOTTLE ONLY CRITICAL RESULT CALLED TO, READ BACK BY AND VERIFIED WITH: J. GRIMSLEY,PHARMD  0981 08/12/2018 Girtha Hake Performed at [facility], [address], [place], [place] [address]    Culture GRAM NEGATIVE RODS  Final   Report Status PENDING  Incomplete  SARS Coronavirus 2 (CEPHEID - Performed in [facility] hospital lab), Hosp Order     Status: None   Collection Time:  08/11/18 10:51 AM  Result Value Ref Range Status   SARS Coronavirus 2 NEGATIVE NEGATIVE Final    Comment: (NOTE) If result is NEGATIVE SARS-CoV-2 target nucleic acids are NOT DETECTED. The SARS-CoV-2 RNA is generally detectable in upper and lower  respiratory specimens during the acute phase of infection. The lowest  concentration of SARS-CoV-2 viral copies this assay can detect is 250  copies / mL. A negative result does not preclude SARS-CoV-2 infection  and should not be used as the sole basis for treatment or other  patient management decisions.  A negative result may occur with  improper specimen collection / handling, submission of specimen other  than nasopharyngeal swab, presence of viral mutation(s) within the  areas targeted by this assay, and inadequate number of viral copies  (<250 copies / mL). A negative result must be combined with clinical  observations, patient history, and epidemiological information. If result is POSITIVE SARS-CoV-2 target nucleic acids are DETECTED. The SARS-CoV-2 RNA is generally detectable in upper and lower  respiratory specimens dur ing the acute phase of infection.  Positive  results are indicative of active infection with SARS-CoV-2.  Clinical  correlation with patient history and other diagnostic information is  necessary to determine patient infection status.  Positive results do  not rule out bacterial infection or co-infection with other viruses. If result is PRESUMPTIVE POSTIVE SARS-CoV-2 nucleic acids MAY BE PRESENT.   A presumptive positive result was obtained on the submitted specimen  and confirmed on repeat testing.  While 2019 novel coronavirus  (SARS-CoV-2) nucleic acids may be present in the submitted sample  additional confirmatory testing may be necessary for epidemiological  and / or clinical management purposes  to differentiate between  SARS-CoV-2 and other Sarbecovirus currently known to infect humans.  If clinically  indicated additional testing with an alternate test  methodology [phone]) is advised. The SARS-CoV-2 RNA is generally  detectable in upper and lower respiratory sp ecimens during the acute  phase of infection. The expected result is Negative. Fact Sheet for Patients:  BoilerBrush.com.cy Fact Sheet for Healthcare Providers: https://pope.com/ This test is not yet approved or cleared by the [place] FDA and has been authorized for detection and/or diagnosis of SARS-CoV-2 by FDA under an Emergency Use Authorization (EUA).  This EUA will remain in effect (meaning this test can be used) for the duration of the COVID-19 declaration under Section 564(b)(1) of the Act, 21 U.S.C. section 360bbb-3(b)(1), unless the authorization is terminated or revoked sooner. Performed at [facility], [address] [place], [place] [address]        Radiology Studies: No results found.    Scheduled Meds: . enoxaparin (LOVENOX) injection  40 mg Subcutaneous Q24H  . potassium chloride  40 mEq Oral Q4H   Continuous Infusions: . sodium chloride 100 mL/hr at 08/13/18 0411  . cefTRIAXone (ROCEPHIN)  IV 2 g (08/13/18 0509)     LOS: 1 day    Time spent: 25 minutes   Noralee Stain, DO [facility] [email or web address] 08/13/2018, 10:24 AM

## 2018-08-13 NOTE — Plan of Care (Signed)
Patient continues to c/o right sided flank pain although this is improving.  Patient afebrile this shift, tolerating regular diet without nausea or vomiting.

## 2018-08-14 LAB — BASIC METABOLIC PANEL
Anion gap: 6 (ref 5–15)
BUN: 7 mg/dL (ref 6–20)
CO2: 24 mmol/L (ref 22–32)
Calcium: 8.2 mg/dL — ABNORMAL LOW (ref 8.9–10.3)
Chloride: 108 mmol/L (ref 98–111)
Creatinine, Ser: 0.78 mg/dL (ref 0.61–1.24)
GFR calc Af Amer: >60 mL/min (ref >60)
GFR calc non Af Amer: >60 mL/min (ref >60)
Glucose, Bld: 108 mg/dL — ABNORMAL HIGH (ref 70–99)
Potassium: 3.7 mmol/L (ref 3.5–5.1)
Sodium: 138 mmol/L (ref 135–145)

## 2018-08-14 LAB — CBC
HCT: 33.8 % — ABNORMAL LOW (ref 39.0–52.0)
Hemoglobin: 10.7 g/dL — ABNORMAL LOW (ref 13.0–17.0)
MCH: 28.9 pg (ref 26.0–34.0)
MCHC: 31.7 g/dL (ref 30.0–36.0)
MCV: 91.4 fL (ref 80.0–100.0)
Platelets: 163 10*3/uL (ref 150–400)
RBC: 3.7 MIL/uL — ABNORMAL LOW (ref 4.22–5.81)
RDW: 13.6 % (ref 11.5–15.5)
WBC: 13.7 10*3/uL — ABNORMAL HIGH (ref 4.0–10.5)
nRBC: 0 % (ref 0.0–0.2)

## 2018-08-14 LAB — CULTURE, BLOOD (ROUTINE X 2): Special Requests: ADEQUATE

## 2018-08-14 LAB — MAGNESIUM: Magnesium: 1.6 mg/dL — ABNORMAL LOW (ref 1.7–2.4)

## 2018-08-14 MED ORDER — OXYCODONE-ACETAMINOPHEN 5-325 MG PO TABS: 1.0000 | ORAL_TABLET | Freq: Four times a day (QID) | ORAL | 0 refills | Status: AC | PRN

## 2018-08-14 MED ORDER — MAGNESIUM SULFATE 2 GM/50ML IV SOLN
2.0000 g | Freq: Once | INTRAVENOUS | Status: AC
  Administered 2018-08-14: 2 g via INTRAVENOUS
  Filled 2018-08-14: qty 50

## 2018-08-14 MED ORDER — SUMATRIPTAN SUCCINATE 25 MG PO TABS
25.0000 mg | ORAL_TABLET | ORAL | Status: DC | PRN
  Filled 2018-08-14: qty 1

## 2018-08-14 MED ORDER — SULFAMETHOXAZOLE-TRIMETHOPRIM 800-160 MG PO TABS: 1.0000 | ORAL_TABLET | Freq: Two times a day (BID) | ORAL | 0 refills | Status: AC

## 2018-08-14 NOTE — Progress Notes (Signed)
Discharge instructions gone over with patient. His prescriptions have been called in. Wife coming to pick him up and bring him home. Patient discharge via wheelchair.

## 2018-08-14 NOTE — Discharge Instructions (Signed)
Pyelonephritis, Adult  Pyelonephritis is a kidney infection. The kidneys are organs that help clean your blood by moving waste out of your blood and into your pee (urine). This infection can happen quickly, or it can last for a long time. In most cases, it clears up with treatment and does not cause other problems. Follow these instructions at home: Medicines  Take over-the-counter and prescription medicines only as told by your doctor.  Take your antibiotic medicine as told by your doctor. Do not stop taking the medicine even if you start to feel better. General instructions  Drink enough fluid to keep your pee clear or pale yellow.  Avoid caffeine, tea, and carbonated drinks.  Pee (urinate) often. Avoid holding in pee for long periods of time.  Pee before and after sex.  After pooping (having a bowel movement), women should wipe from front to back. Use each tissue only once.  Keep all follow-up visits as told by your doctor. This is important. Contact a doctor if:  You do not feel better after 2 days.  Your symptoms get worse.  You have a fever. Get help right away if:  You cannot take your medicine or drink fluids as told.  You have chills and shaking.  You throw up (vomit).  You have very bad pain in your side (flank) or back.  You feel very weak or you pass out (faint). This information is not intended to replace advice given to you by your health care provider. Make sure you discuss any questions you have with your health care provider. Document Released: 04/15/2004 Document Revised: 08/14/2015 Document Reviewed: 07/01/2014 Elsevier Interactive Patient Education  2019 [employer]   Hypokalemia Hypokalemia means that the amount of potassium in the blood is lower than normal.Potassium is a chemical that helps regulate the amount of fluid in the body (electrolyte). It also stimulates muscle tightening (contraction) and helps nerves work properly.Normally, most of  the bodys potassium is inside of cells, and only a very small amount is in the blood. Because the amount in the blood is so small, minor changes to potassium levels in the blood can be life-threatening. What are the causes? This condition may be caused by:  Antibiotic medicine.  Diarrhea or vomiting. Taking too much of a medicine that helps you have a bowel movement (laxative) can cause diarrhea and lead to hypokalemia.  Chronic kidney disease (CKD).  Medicines that help the body get rid of excess fluid (diuretics).  Eating disorders, such as bulimia.  Low magnesium levels in the body.  Sweating a lot. What are the signs or symptoms? Symptoms of this condition include:  Weakness.  Constipation.  Fatigue.  Muscle cramps.  Mental confusion.  Skipped heartbeats or irregular heartbeat (palpitations).  Tingling or numbness. How is this diagnosed? This condition is diagnosed with a blood test. How is this treated? Hypokalemia can be treated by taking potassium supplements by mouth or adjusting the medicines that you take. Treatment may also include eating more foods that contain a lot of potassium. If your potassium level is very low, you may need to get potassium through an IV tube in one of your veins and be monitored in the hospital. Follow these instructions at home:   Take over-the-counter and prescription medicines only as told by your health care provider. This includes vitamins and supplements.  Eat a healthy diet. A healthy diet includes fresh fruits and vegetables, whole grains, healthy fats, and lean proteins.  If instructed, eat more foods  that contain a lot of potassium, such as: ? Nuts, such as peanuts and pistachios. ? Seeds, such as sunflower seeds and pumpkin seeds. ? Peas, lentils, and lima beans. ? Whole grain and bran cereals and breads. ? Fresh fruits and vegetables, such as apricots, avocado, bananas, cantaloupe, kiwi, oranges, tomatoes, asparagus,  and potatoes. ? Orange juice. ? Tomato juice. ? Red meats. ? Yogurt.  Keep all follow-up visits as told by your health care provider. This is important. Contact a health care provider if:  You have weakness that gets worse.  You feel your heart pounding or racing.  You vomit.  You have diarrhea.  You have diabetes (diabetes mellitus) and you have trouble keeping your blood sugar (glucose) in your target range. Get help right away if:  You have chest pain.  You have shortness of breath.  You have vomiting or diarrhea that lasts for more than 2 days.  You faint. This information is not intended to replace advice given to you by your health care provider. Make sure you discuss any questions you have with your health care provider. Document Released: 03/08/2005 Document Revised: 10/25/2015 Document Reviewed: 10/25/2015 Elsevier Interactive Patient Education  2019 [employer]   Potassium Content of Foods  Potassium is a mineral found in many foods and drinks. It affects how the heart works, and helps keep fluids and minerals balanced in the body. The amount of potassium you need each day depends on your age and any medical conditions you may have. Talk to your health care provider or dietitian about how much potassium you need. The following lists of foods provide the general serving size for foods and the approximate amount of potassium in each serving, listed in milligrams (mg). Actual values may vary depending on the product and how it is processed. High in potassium The following foods and beverages have 200 mg or more of potassium per serving:  Apricots (raw) - 2 have 200 mg of potassium.  Apricots (dry) - 5 have 200 mg of potassium.  Artichoke - 1 medium has 345 mg of potassium.  Avocado -  fruit has 245 mg of potassium.  Banana - 1 medium fruit has 425 mg of potassium.  [place]ima or baked beans (canned) -  cup has 280 mg of potassium.  White beans (canned) -   cup has 595 mg potassium.  Beef roast - 3 oz has 320 mg of potassium.  Ground beef - 3 oz has 270 mg of potassium.  Beets (raw or cooked) -  cup has 260 mg of potassium.  Bran muffin - 2 oz has 300 mg of potassium.  Broccoli (cooked) -  cup has 230 mg of potassium.  Brussels sprouts -  cup has 250 mg of potassium.  Cantaloupe -  cup has 215 mg of potassium.  Cereal, 100% bran -  cup has 200-400 mg of potassium.  Cheeseburger -1 single fast food burger has 225-400 mg of potassium.  Chicken - 3 oz has 220 mg of potassium.  Clams (canned) - 3 oz has 535 mg of potassium.  Crab - 3 oz has 225 mg of potassium.  Dates - 5 have 270 mg of potassium.  Dried beans and peas -  cup has 300-475 mg of potassium.  Figs (dried) - 2 have 260 mg of potassium.  Fish (halibut, tuna, cod, snapper) - 3 oz has 480 mg of potassium.  Fish (salmon, haddock, swordfish, perch) - 3 oz has 300 mg of potassium.  Fish (  tuna, canned) - 3 oz has 200 mg of potassium.  [place] fries (fast food) - 3 oz has 470 mg of potassium.  Granola with fruit and nuts -  cup has 200 mg of potassium.  Grapefruit juice -  cup has 200 mg of potassium.  Honeydew melon -  cup has 200 mg of potassium.  Kale (raw) - 1 cup has 300 mg of potassium.  Kiwi - 1 medium fruit has 240 mg of potassium.  Kohlrabi, rutabaga, parsnips -  cup has 280 mg of potassium.  Lentils -  cup has 365 mg of potassium.  Mango - 1 each has 325 mg of potassium.  Milk (nonfat, low-fat, whole, buttermilk) - 1 cup has 350-380 mg of potassium.  Milk (chocolate) - 1 cup has 420 mg of potassium  Molasses - 1 Tbsp has 295 mg of potassium.  Mushrooms -  cup has 280 mg of potassium.  Nectarine - 1 each has 275 mg of potassium.  Nuts (almonds, peanuts, hazelnuts, [place], cashew, mixed) - 1 oz has 200 mg of potassium.  Nuts (pistachios) - 1 oz has 295 mg of potassium.  Orange - 1 fruit has 240 mg of potassium.  Orange juice -   cup has 235 mg of potassium.  Papaya -  medium fruit has 390 mg of potassium.  Peanut butter (chunky) - 2 Tbsp has 240 mg of potassium.  Peanut butter (smooth) - 2 Tbsp has 210 mg of potassium.  Pear - 1 medium (200 mg) of potassium.  Pomegranate - 1 whole fruit has 400 mg of potassium.  Pomegranate juice -  cup has 215 mg of potassium.  Pork - 3 oz has 350 mg of potassium.  Potato chips (salted) - 1 oz has 465 mg of potassium.  Potato (baked with skin) - 1 medium has 925 mg of potassium.  Potato (boiled) -  cup has 255 mg of potassium.  Potato (Mashed) -  cup has 330 mg of potassium.  Prune juice -  cup has 370 mg of potassium.  Prunes - 5 have 305 mg of potassium.  Pudding (chocolate) -  cup has 230 mg of potassium.  Pumpkin (canned) -  cup has 250 mg of potassium.  Raisins (seedless) -  cup has 270 mg of potassium.  Seeds (sunflower or pumpkin) - 1 oz has 240 mg of potassium.  Soy milk - 1 cup has 300 mg of potassium.  Spinach (cooked) - 1/2 cup has 420 mg of potassium.  Spinach (canned) -  cup has 370 mg of potassium.  Sweet potato (baked with skin) - 1 medium has 450 mg of potassium.  Swiss chard -  cup has 480 mg of potassium.  Tomato or vegetable juice -  cup has 275 mg of potassium.  Tomato (sauce or puree) -  cup has 400-550 mg of potassium.  Tomato (raw) - 1 medium has 290 mg of potassium.  Tomato (canned) -  cup has 200-300 mg of potassium.  [place] - 3 oz has 250 mg of potassium.  Wheat germ - 1 oz has 250 mg of potassium.  Winter squash -  cup has 250 mg of potassium.  Yogurt (plain or fruited) - 6 oz has 260-435 mg of potassium.  Zucchini -  cup has 220 mg of potassium. Moderate in potassium The following foods and beverages have 50-200 mg of potassium per serving:  Apple - 1 fruit has 150 mg of potassium  Apple juice -  cup has 150  mg of potassium  Applesauce -  cup has 90 mg of potassium  Apricot nectar -  cup  has 140 mg of potassium  Asparagus (small spears) -  cup has 155 mg of potassium  Asparagus (large spears) - 6 have 155 mg of potassium  Bagel (cinnamon raisin) - 1 four-inch bagel has 130 mg of potassium  Bagel (egg or plain) - 1 four- inch bagel has 70 mg of potassium  Beans (green) -  cup has 90 mg of potassium  Beans (yellow) -  cup has 190 mg of potassium  Beer, regular - 12 oz has 100 mg of potassium  Beets (canned) -  cup has 125 mg of potassium  Blackberries -  cup has 115 mg of potassium  Blueberries -  cup has 60 mg of potassium  Bread (whole wheat) - 1 slice has 70 mg of potassium  Broccoli (raw) -  cup has 145 mg of potassium  Cabbage -  cup has 150 mg of potassium  Carrots (cooked or raw) -  cup has 180 mg of potassium  Cauliflower (raw) -  cup has 150 mg of potassium  Celery (raw) -  cup has 155 mg of potassium  Cereal, bran flakes -  cup has 120-150 mg of potassium  Cheese (cottage) -  cup has 110 mg of potassium  Cherries - 10 have 150 mg of potassium  Chocolate - 1 oz bar has 165 mg of potassium  Coffee (brewed) - 6 oz has 90 mg of potassium  Corn -  cup or 1 ear has 195 mg of potassium  Cucumbers -  cup has 80 mg of potassium  Egg - 1 large egg has 60 mg of potassium  Eggplant -  cup has 60 mg of potassium  Endive (raw) -  cup has 80 mg of potassium  English muffin - 1 has 65 mg of potassium  Fish (ocean perch) - 3 oz has 192 mg of potassium  Frankfurter, beef or pork - 1 has 75 mg of potassium  Fruit cocktail -  cup has 115 mg of potassium  Grape juice -  cup has 170 mg of potassium  Grapefruit -  fruit has 175 mg of potassium  Grapes -  cup has 155 mg of potassium  Greens: kale, turnip, collard -  cup has 110-150 mg of potassium  Ice cream or frozen yogurt (chocolate) -  cup has 175 mg of potassium  Ice cream or frozen yogurt (vanilla) -  cup has 120-150 mg of potassium  Lemons, limes - 1 each has  80 mg of potassium  Lettuce - 1 cup has 100 mg of potassium  Mixed vegetables -  cup has 150 mg of potassium  Mushrooms, raw -  cup has 110 mg of potassium  Nuts (walnuts, pecans, or macadamia) - 1 oz has 125 mg of potassium  Oatmeal -  cup has 80 mg of potassium  Okra -  cup has 110 mg of potassium  Onions -  cup has 120 mg of potassium  Peach - 1 has 185 mg of potassium  Peaches (canned) -  cup has 120 mg of potassium  Pears (canned) -  cup has 120 mg of potassium  Peas, green (frozen) -  cup has 90 mg of potassium  Peppers (Green) -  cup has 130 mg of potassium  Peppers (Red) -  cup has 160 mg of potassium  Pineapple juice -  cup has 165 mg of potassium  Pineapple (fresh or canned) -  cup has 100 mg of potassium  Plums - 1 has 105 mg of potassium  Pudding, vanilla -  cup has 150 mg of potassium  Raspberries -  cup has 90 mg of potassium  Rhubarb -  cup has 115 mg of potassium  Rice, wild -  cup has 80 mg of potassium  Shrimp - 3 oz has 155 mg of potassium  Spinach (raw) - 1 cup has 170 mg of potassium  Strawberries -  cup has 125 mg of potassium  Summer squash -  cup has 175-200 mg of potassium  Swiss chard (raw) - 1 cup has 135 mg of potassium  Tangerines - 1 fruit has 140 mg of potassium  Tea, brewed - 6 oz has 65 mg of potassium  Turnips -  cup has 140 mg of potassium  Watermelon -  cup has 85 mg of potassium  Wine (Red, table) - 5 oz has 180 mg of potassium  Wine (White, table) - 5 oz 100 mg of potassium Low in potassium The following foods and beverages have less than 50 mg of potassium per serving.  Bread (white) - 1 slice has 30 mg of potassium  Carbonated beverages - 12 oz has less than 5 mg of potassium  Cheese - 1 oz has 20-30 mg of potassium  Cranberries -  cup has 45 mg of potassium  Cranberry juice cocktail -  cup has 20 mg of potassium  Fats and oils - 1 Tbsp has less than 5 mg of potassium  Hummus -  1 Tbsp has 32 mg of potassium  Nectar (papaya, mango, or pear) -  cup has 35 mg of potassium  Rice (white or brown) -  cup has 50 mg of potassium  Spaghetti or macaroni (cooked) -  cup has 30 mg of potassium  [place]illa, flour or corn - 1 has 50 mg of potassium  Waffle - 1 four-inch waffle has 50 mg of potassium  Water chestnuts -  cup has 40 mg of potassium Summary  Potassium is a mineral found in many foods and drinks. It affects how the heart works, and helps keep fluids and minerals balanced in the body.  The amount of potassium you need each day depends on your age and any existing medical conditions you may have. Your health care provider or dietitian may recommend an amount of potassium that you should have each day. This information is not intended to replace advice given to you by your health care provider. Make sure you discuss any questions you have with your health care provider. Document Released: 10/20/2004 Document Revised: 06/02/2016 Document Reviewed: 06/02/2016 Elsevier Interactive Patient Education  2019 [employer].

## 2018-08-14 NOTE — Discharge Summary (Signed)
Physician Discharge Summary  Salvatore Shear Blucher ZOX:[id number] DOB: 1980-04-20 DOA: 08/11/2018  PCP: Julaine Fusi, NP  Admit date: 08/11/2018 Discharge date: 08/14/2018  Admitted From: Home Disposition:  Home   Recommendations for Outpatient Follow-up:  1. Follow up with PCP in 1 week 2. Follow up with [facility]  3. Please obtain CBC in 1 week to recheck leukocytosis, BMP to check kidney function while on Bactrim  4. Please follow up on the following pending results: final blood culture results from 08/12/2018  Discharge Condition: Stable CODE STATUS: Full  Diet recommendation: Regular   Brief/Interim Summary: Isaiah Heath a 38 y.o.malewithout significant medical history presents to the hospital with chief complaint of right flank pain for the past 3-4 days. Patient has no significant medical history, however worth mentioning he was evaluated in April 2020 in the ED for left groin pain, hematuria, with a CT scan showing inflammation surrounding the inguinal canal of unknown significance, and ultrasound showed possible thrombosed varicocele. Urology recommended warm compresses, Bactrim and follow-up in office. Patient missed the follow-up appointment due to believing it was on a different day,and now it has been rescheduled at the end of this month. He is not really bothered by his left inguinal area today. Patient also has been complaining of fever and chills for the last couple of days, along with nausea but no vomiting or diarrhea. He denies any chest pain, denies any shortness of breath. He has no cough or chest congestion. He denies any dysuria,burning with urination or increased frequency. He also reports poor p.o. intake.  Sepsis secondary to E. coli bacteremia, right-sided pyelonephritis -Sepsis present on admission -Urine culture showing E coli sensitive to Rocephin. Patient allergic to Cipro and sensitivity of culture to cefazolin is 16. Prescribed Bactrim  for 10 additional days  -Repeat blood culture negative to date   Tobacco abuse -Cessation counseling  Hypomagnesemia -Replaced prior to discharge home   Discharge Instructions  Discharge Instructions    Call MD for:  extreme fatigue   Complete by:  As directed    Call MD for:  hives   Complete by:  As directed    Call MD for:  persistant nausea and vomiting   Complete by:  As directed    Call MD for:  severe uncontrolled pain   Complete by:  As directed    Call MD for:  temperature >100.4   Complete by:  As directed    Diet general   Complete by:  As directed    Discharge instructions   Complete by:  As directed    You were cared for by a hospitalist during your hospital stay. If you have any questions about your discharge medications or the care you received while you were in the hospital after you are discharged, you can call the unit and ask to speak with the hospitalist on call if the hospitalist that took care of you is not available. Once you are discharged, your primary care physician will handle any further medical issues. Please note that NO REFILLS for any discharge medications will be authorized once you are discharged, as it is imperative that you return to your primary care physician (or establish a relationship with a primary care physician if you do not have one) for your aftercare needs so that they can reassess your need for medications and monitor your lab values.   Increase activity slowly   Complete by:  As directed      Allergies as  of 08/14/2018      Reactions   Ciprofloxacin Hives      Medication List    TAKE these medications   ibuprofen 200 MG tablet Commonly known as:  ADVIL Take 400 mg by mouth every 6 (six) hours as needed for headache or mild pain.   oxyCODONE-acetaminophen 5-325 MG tablet Commonly known as:  Percocet Take 1-2 tablets by mouth every 6 (six) hours as needed for up to 5 days for moderate pain. What changed:  reasons to take  this   sulfamethoxazole-trimethoprim 800-160 MG tablet Commonly known as:  BACTRIM DS Take 1 tablet by mouth 2 (two) times daily for 10 days.      Follow-up Information    Danford, Jinny BlossomKaty D, NP. Schedule an appointment as soon as possible for a visit in 1 week(s).   Specialty:  Family Medicine Why:  Repeat CBC and BMP  Contact information: [address] [place] [id number] [phone]        [facility]. Call.   Contact information: [address] [place] [id number] [phone]         Allergies  Allergen Reactions  . Ciprofloxacin Hives    Consultations:  None   Procedures/Studies: Ct Abdomen Pelvis W Contrast  Result Date: 08/11/2018 CLINICAL DATA:  Right mid abdominal pain for 5 days.  Leukocytosis. EXAM: CT ABDOMEN AND PELVIS WITH CONTRAST TECHNIQUE: Multidetector CT imaging of the abdomen and pelvis was performed using the standard protocol following bolus administration of intravenous contrast. CONTRAST:  100mL OMNIPAQUE IOHEXOL 300 MG/ML  SOLN COMPARISON:  06/30/2018 FINDINGS: Lower chest:  No contributory findings. Hepatobiliary: No focal liver abnormality.No evidence of biliary obstruction or stone. Pancreas: Unremarkable. Spleen: Unremarkable. Adrenals/Urinary Tract: Negative adrenals. Patchy hypoenhancement of the right kidney with perinephric edema. There is pelviectasis showing urothelial thickening. No evident urinary obstruction. Negative left kidney. There is enhancement of the right renal artery and vein. Stomach/Bowel: Appendix is difficult to visualize due to adjacent non-opacified small bowel loops. No evident right lower quadrant inflammation. No bowel obstruction. Vascular/Lymphatic: No acute vascular abnormality. No mass or adenopathy. Reproductive:Negative Other: No ascites or pneumoperitoneum. Musculoskeletal: Focal L5-S1 disc degeneration. IMPRESSION: Right pyelonephritis without abscess or ureteral  obstruction. Electronically Signed   By: Marnee SpringJonathon  Watts M.D.   On: 08/11/2018 07:08       Discharge Exam: Vitals:   08/13/18 2109 08/14/18 0658  BP: 125/83 134/78  Pulse: 74 73  Resp: 17 18  Temp: 98.9 F (37.2 C) 99.1 F (37.3 C)  SpO2: 98% 97%    General: Pt is alert, awake, not in acute distress Cardiovascular: RRR, S1/S2 +, no rubs, no gallops Respiratory: CTA bilaterally, no wheezing, no rhonchi Abdominal: Soft, NT, ND, bowel sounds + Extremities: no edema, no cyanosis    The results of significant diagnostics from this hospitalization (including imaging, microbiology, ancillary and laboratory) are listed below for reference.     Microbiology: Recent Results (from the past 240 hour(s))  Culture, blood (routine x 2)     Status: Abnormal   Collection Time: 08/11/18  5:16 AM  Result Value Ref Range Status   Specimen Description   Final    BLOOD RIGHT FOREARM Performed at [facility] [place], [address] [place], [place] [address]    Special Requests   Final    BOTTLES DRAWN AEROBIC AND ANAEROBIC Blood Culture adequate volume Performed at [facility] [place], [address] [place], [place] [address]  Culture  Setup Time   Final    GRAM NEGATIVE RODS IN BOTH AEROBIC AND ANAEROBIC BOTTLES Organism ID to follow CRITICAL RESULT CALLED TO, READ BACK BY AND VERIFIED WITH: Peggyann Juba [facility] 08/11/18 2321 Performed at [facility], [address], [place], [place] [address]    Culture ESCHERICHIA COLI (A)  Final   Report Status 08/13/2018 FINAL  Final   Organism ID, Bacteria ESCHERICHIA COLI  Final      Susceptibility   Escherichia coli - MIC*    AMPICILLIN >=32 RESISTANT Resistant     CEFAZOLIN 16 SENSITIVE Sensitive     CEFEPIME <=1 SENSITIVE Sensitive     CEFTAZIDIME <=1 SENSITIVE Sensitive     CEFTRIAXONE <=1 SENSITIVE Sensitive     CIPROFLOXACIN <=0.25 SENSITIVE Sensitive     GENTAMICIN <=1 SENSITIVE Sensitive     IMIPENEM <=0.25  SENSITIVE Sensitive     TRIMETH/SULFA <=20 SENSITIVE Sensitive     AMPICILLIN/SULBACTAM >=32 RESISTANT Resistant     PIP/TAZO <=4 SENSITIVE Sensitive     Extended ESBL NEGATIVE Sensitive     * ESCHERICHIA COLI  Blood Culture ID Panel (Reflexed)     Status: Abnormal   Collection Time: 08/11/18  5:16 AM  Result Value Ref Range Status   Enterococcus species NOT DETECTED NOT DETECTED Final   Listeria monocytogenes NOT DETECTED NOT DETECTED Final   Staphylococcus species NOT DETECTED NOT DETECTED Final   Staphylococcus aureus (BCID) NOT DETECTED NOT DETECTED Final   Streptococcus species NOT DETECTED NOT DETECTED Final   Streptococcus agalactiae NOT DETECTED NOT DETECTED Final   Streptococcus pneumoniae NOT DETECTED NOT DETECTED Final   Streptococcus pyogenes NOT DETECTED NOT DETECTED Final   Acinetobacter baumannii NOT DETECTED NOT DETECTED Final   Enterobacteriaceae species DETECTED (A) NOT DETECTED Final    Comment: Enterobacteriaceae represent a large family of gram-negative bacteria, not a single organism. CRITICAL RESULT CALLED TO, READ BACK BY AND VERIFIED WITH: J GRIMSLEY PHARMD 08/11/18 2321 JDW    Enterobacter cloacae complex NOT DETECTED NOT DETECTED Final   Escherichia coli DETECTED (A) NOT DETECTED Final    Comment: CRITICAL RESULT CALLED TO, READ BACK BY AND VERIFIED WITH: J GRIMSLEY PHARMD 08/11/18 JDW    Klebsiella oxytoca NOT DETECTED NOT DETECTED Final   Klebsiella pneumoniae NOT DETECTED NOT DETECTED Final   Proteus species NOT DETECTED NOT DETECTED Final   Serratia marcescens NOT DETECTED NOT DETECTED Final   Carbapenem resistance NOT DETECTED NOT DETECTED Final   Haemophilus influenzae NOT DETECTED NOT DETECTED Final   Neisseria meningitidis NOT DETECTED NOT DETECTED Final   Pseudomonas aeruginosa NOT DETECTED NOT DETECTED Final   Candida albicans NOT DETECTED NOT DETECTED Final   Candida glabrata NOT DETECTED NOT DETECTED Final   Candida krusei NOT DETECTED NOT  DETECTED Final   Candida parapsilosis NOT DETECTED NOT DETECTED Final   Candida tropicalis NOT DETECTED NOT DETECTED Final    Comment: Performed at [facility], [address] [place], [place] [address]  Urine culture     Status: Abnormal   Collection Time: 08/11/18  5:17 AM  Result Value Ref Range Status   Specimen Description   Final    URINE, CLEAN CATCH Performed at [facility], [address] [place], [place] [address]    Special Requests   Final    NONE Performed at [facility], [address] [place], [place] [address]    Culture >=100,000 COLONIES/mL ESCHERICHIA COLI (  A)  Final   Report Status 08/13/2018 FINAL  Final   Organism ID, Bacteria ESCHERICHIA COLI (A)  Final      Susceptibility   Escherichia coli - MIC*    AMPICILLIN >=32 RESISTANT Resistant     CEFAZOLIN 16 SENSITIVE Sensitive     CEFTRIAXONE <=1 SENSITIVE Sensitive     CIPROFLOXACIN <=0.25 SENSITIVE Sensitive     GENTAMICIN <=1 SENSITIVE Sensitive     IMIPENEM <=0.25 SENSITIVE Sensitive     NITROFURANTOIN <=16 SENSITIVE Sensitive     TRIMETH/SULFA <=20 SENSITIVE Sensitive     AMPICILLIN/SULBACTAM >=32 RESISTANT Resistant     PIP/TAZO <=4 SENSITIVE Sensitive     Extended ESBL NEGATIVE Sensitive     * >=100,000 COLONIES/mL ESCHERICHIA COLI  SARS Coronavirus 2 (Hosp order,Performed in [facility] lab via [employer])     Status: None   Collection Time: 08/11/18  5:22 AM  Result Value Ref Range Status   SARS Coronavirus 2 ([employer] Now) NEGATIVE NEGATIVE Final    Comment: (NOTE) Interpretive Result Comment(s): COVID 19 Positive SARS CoV 2 target nucleic acids are DETECTED. The SARS CoV 2 RNA is generally detectable in upper and lower respiratory specimens during the acute phase of infection.  Positive results are indicative of active infection with SARS CoV 2.  Clinical correlation with patient history and other diagnostic information is necessary to determine patient  infection status.  Positive results do not rule out bacterial infection or coinfection with other viruses. The expected result is Negative. COVID 19 Negative SARS CoV 2 target nucleic acids are NOT DETECTED. The SARS CoV 2 RNA is generally detectable in upper and lower respiratory specimens during the acute phase of infection.  Negative results do not preclude SARS CoV 2 infection, do not rule out coinfections with other pathogens, and should not be used as the sole basis for treatment or other patient management decisions.  Negative results must be combined with clinical  observations, patient history, and epidemiological information. The expected result is Negative. Invalid Presence or absence of SARS CoV 2 nucleic acids cannot be determined. Repeat testing was performed on the submitted specimen and repeated Invalid results were obtained.  If clinically indicated, additional testing on a new specimen with an alternate test methodology [phone]) is advised.  The SARS CoV 2 RNA is generally detectable in upper and lower respiratory specimens during the acute phase of infection. The expected result is Negative. Fact Sheet for Patients:  http://www.graves-ford.org/ Fact Sheet for Healthcare Providers: EnviroConcern.si This test is not yet approved or cleared by the [place] FDA and has been authorized for detection and/or diagnosis of SARS CoV 2 by FDA under an Emergency Use Authorization (EUA).  This EUA will remain in effect (meaning this test can be used) for the duration of the COVID19 d eclaration under Section 564(b)(1) of the Act, 21 U.S.C. section [phone] 3(b)(1), unless the authorization is terminated or revoked sooner. Performed at [facility], [address] [place], [place] [address]   Culture, blood (routine x 2)     Status: Abnormal   Collection Time: 08/11/18  7:34 AM  Result Value Ref Range Status    Specimen Description   Final    BLOOD LEFT ANTECUBITAL Performed at [facility], [address] [place], [place] [address]    Special Requests   Final    BOTTLES DRAWN AEROBIC AND ANAEROBIC Blood Culture adequate volume Performed at [facility], [address] [place], [place] [address]    Culture  Setup Time   Final    GRAM NEGATIVE RODS AEROBIC BOTTLE ONLY CRITICAL RESULT CALLED TO, READ BACK BY AND VERIFIED WITH: J. GRIMSLEY,PHARMD 6045 08/12/2018 T. TYSOR    Culture (A)  Final    ESCHERICHIA COLI SUSCEPTIBILITIES PERFORMED ON PREVIOUS CULTURE WITHIN THE LAST 5 DAYS. Performed at [facility], [address] [place], [place] [address]    Report Status 08/14/2018 FINAL  Final  SARS Coronavirus 2 (CEPHEID - Performed in [facility] hospital lab), Hosp Order     Status: None   Collection Time: 08/11/18 10:51 AM  Result Value Ref Range Status   SARS Coronavirus 2 NEGATIVE NEGATIVE Final    Comment: (NOTE) If result is NEGATIVE SARS-CoV-2 target nucleic acids are NOT DETECTED. The SARS-CoV-2 RNA is generally detectable in upper and lower  respiratory specimens during the acute phase of infection. The lowest  concentration of SARS-CoV-2 viral copies this assay can detect is 250  copies / mL. A negative result does not preclude SARS-CoV-2 infection  and should not be used as the sole basis for treatment or other  patient management decisions.  A negative result may occur with  improper specimen collection / handling, submission of specimen other  than nasopharyngeal swab, presence of viral mutation(s) within the  areas targeted by this assay, and inadequate number of viral copies  (<250 copies / mL). A negative result must be combined with clinical  observations, patient history, and epidemiological information. If result is POSITIVE SARS-CoV-2 target nucleic acids are DETECTED. The SARS-CoV-2 RNA is generally detectable in upper and lower  respiratory  specimens dur ing the acute phase of infection.  Positive  results are indicative of active infection with SARS-CoV-2.  Clinical  correlation with patient history and other diagnostic information is  necessary to determine patient infection status.  Positive results do  not rule out bacterial infection or co-infection with other viruses. If result is PRESUMPTIVE POSTIVE SARS-CoV-2 nucleic acids MAY BE PRESENT.   A presumptive positive result was obtained on the submitted specimen  and confirmed on repeat testing.  While 2019 novel coronavirus  (SARS-CoV-2) nucleic acids may be present in the submitted sample  additional confirmatory testing may be necessary for epidemiological  and / or clinical management purposes  to differentiate between  SARS-CoV-2 and other Sarbecovirus currently known to infect humans.  If clinically indicated additional testing with an alternate test  methodology [phone]) is advised. The SARS-CoV-2 RNA is generally  detectable in upper and lower respiratory sp ecimens during the acute  phase of infection. The expected result is Negative. Fact Sheet for Patients:  BoilerBrush.com.cy Fact Sheet for Healthcare Providers: https://pope.com/ This test is not yet approved or cleared by the [place] FDA and has been authorized for detection and/or diagnosis of SARS-CoV-2 by FDA under an Emergency Use Authorization (EUA).  This EUA will remain in effect (meaning this test can be used) for the duration of the COVID-19 declaration under Section 564(b)(1) of the Act, 21 U.S.C. section 360bbb-3(b)(1), unless the authorization is terminated or revoked sooner. Performed at [facility], [address] [place], [place] [address]   Culture, blood (routine x 2)     Status: None (Preliminary result)   Collection Time: 08/12/18  2:53 PM  Result Value Ref Range Status   Specimen Description   Final    RIGHT  ANTECUBITAL Performed at [facility], [address], [place] [address]    Special Requests   Final    BOTTLES DRAWN AEROBIC ONLY Blood Culture adequate volume Performed at [facility], [address], [place], [place] [address]    Culture   Final    NO GROWTH 2 DAYS Performed at [facility], [address] [place], [place] [address]    Report Status PENDING  Incomplete  Culture, blood (routine x 2)     Status: None (Preliminary result)   Collection Time: 08/12/18  2:55 PM  Result Value Ref Range Status   Specimen Description   Final    BLOOD RIGHT HAND Performed at [facility], [address] [place], [place] [address]    Special Requests   Final    BOTTLES DRAWN AEROBIC ONLY Blood Culture adequate volume Performed at [facility], [address], [place], [place] [address]    Culture   Final    NO GROWTH 2 DAYS Performed at [facility], [address], [place], [place] [address]    Report Status PENDING  Incomplete     Labs: BNP (last 3 results) No results for input(s): BNP in the last 8760 hours. Basic Metabolic Panel: Recent Labs  Lab 08/11/18 0521 08/12/18 0430 08/13/18 0355 08/13/18 0400 08/14/18 0446  NA 135 136 137  --  138  K 3.0* 3.9 2.9*  --  3.7  CL 105 103 104  --  108  CO2 23 21* 26  --  24  GLUCOSE 102* 80 110*  --  108*  BUN 9 9 11   --  7  CREATININE 0.75 0.90 0.90  --  0.78  CALCIUM 8.7* 7.8* 8.5*  --  8.2*  MG  --   --   --  1.9 1.6*   Liver Function Tests: Recent Labs  Lab 08/11/18 0521 08/12/18 0430  AST 19 24  ALT 16 14  ALKPHOS 58 82  BILITOT 0.8 1.2  PROT 6.8 6.5  ALBUMIN 3.7 3.2*   Recent Labs  Lab 08/11/18 0521  LIPASE 19   No results for input(s): AMMONIA in the last 168 hours. CBC: Recent Labs  Lab 08/11/18 0521 08/12/18 0430 08/13/18 0355 08/14/18 0446  WBC 19.0* 29.7* 20.2* 13.7*  NEUTROABS 17.0*  --   --   --    HGB 12.4* 11.4* 11.1* 10.7*  HCT 40.1 37.6* 35.2* 33.8*  MCV 92.2 94.7 92.1 91.4  PLT 157 153 144* 163   Cardiac Enzymes: No results for input(s): CKTOTAL, CKMB, CKMBINDEX, TROPONINI in the last 168 hours. BNP: Invalid input(s): POCBNP CBG: No results for input(s): GLUCAP in the last 168 hours. D-Dimer No results for input(s): DDIMER in the last 72 hours. Hgb A1c No results for input(s): HGBA1C in the last 72 hours. Lipid Profile No results for input(s): CHOL, HDL, LDLCALC, TRIG, CHOLHDL, LDLDIRECT in the last 72 hours. Thyroid function studies No results for input(s): TSH, T4TOTAL, T3FREE, THYROIDAB in the last 72 hours.  Invalid input(s): FREET3 Anemia work up No results for input(s): VITAMINB12, FOLATE, FERRITIN, TIBC, IRON, RETICCTPCT in the last 72 hours. Urinalysis    Component Value Date/Time   COLORURINE YELLOW 08/11/2018 0517   APPEARANCEUR CLOUDY (A) 08/11/2018 0517   LABSPEC 1.020 08/11/2018 0517   PHURINE 6.0 08/11/2018 0517   GLUCOSEU NEGATIVE 08/11/2018 0517   HGBUR LARGE (A) 08/11/2018 0517   BILIRUBINUR NEGATIVE 08/11/2018 0517   BILIRUBINUR small 06/29/2018 1543   KETONESUR 15 (  A) 08/11/2018 0517   PROTEINUR 100 (A) 08/11/2018 0517   UROBILINOGEN 0.2 06/29/2018 1543   UROBILINOGEN 0.2 07/24/2014 1332   NITRITE NEGATIVE 08/11/2018 0517   LEUKOCYTESUR SMALL (A) 08/11/2018 0517   Sepsis Labs Invalid input(s): PROCALCITONIN,  WBC,  LACTICIDVEN Microbiology Recent Results (from the past 240 hour(s))  Culture, blood (routine x 2)     Status: Abnormal   Collection Time: 08/11/18  5:16 AM  Result Value Ref Range Status   Specimen Description   Final    BLOOD RIGHT FOREARM Performed at [facility], [address] [place], [place] [address]    Special Requests   Final    BOTTLES DRAWN AEROBIC AND ANAEROBIC Blood Culture adequate volume Performed at [facility], [address] [place], [place] [address]    Culture  Setup Time    Final    GRAM NEGATIVE RODS IN BOTH AEROBIC AND ANAEROBIC BOTTLES Organism ID to follow CRITICAL RESULT CALLED TO, READ BACK BY AND VERIFIED WITHPeggyann Juba [facility] 08/11/18 2321 Performed at [facility], [address] [place], [place] [address]    Culture ESCHERICHIA COLI (A)  Final   Report Status 08/13/2018 FINAL  Final   Organism ID, Bacteria ESCHERICHIA COLI  Final      Susceptibility   Escherichia coli - MIC*    AMPICILLIN >=32 RESISTANT Resistant     CEFAZOLIN 16 SENSITIVE Sensitive     CEFEPIME <=1 SENSITIVE Sensitive     CEFTAZIDIME <=1 SENSITIVE Sensitive     CEFTRIAXONE <=1 SENSITIVE Sensitive     CIPROFLOXACIN <=0.25 SENSITIVE Sensitive     GENTAMICIN <=1 SENSITIVE Sensitive     IMIPENEM <=0.25 SENSITIVE Sensitive     TRIMETH/SULFA <=20 SENSITIVE Sensitive     AMPICILLIN/SULBACTAM >=32 RESISTANT Resistant     PIP/TAZO <=4 SENSITIVE Sensitive     Extended ESBL NEGATIVE Sensitive     * ESCHERICHIA COLI  Blood Culture ID Panel (Reflexed)     Status: Abnormal   Collection Time: 08/11/18  5:16 AM  Result Value Ref Range Status   Enterococcus species NOT DETECTED NOT DETECTED Final   Listeria monocytogenes NOT DETECTED NOT DETECTED Final   Staphylococcus species NOT DETECTED NOT DETECTED Final   Staphylococcus aureus (BCID) NOT DETECTED NOT DETECTED Final   Streptococcus species NOT DETECTED NOT DETECTED Final   Streptococcus agalactiae NOT DETECTED NOT DETECTED Final   Streptococcus pneumoniae NOT DETECTED NOT DETECTED Final   Streptococcus pyogenes NOT DETECTED NOT DETECTED Final   Acinetobacter baumannii NOT DETECTED NOT DETECTED Final   Enterobacteriaceae species DETECTED (A) NOT DETECTED Final    Comment: Enterobacteriaceae represent a large family of gram-negative bacteria, not a single organism. CRITICAL RESULT CALLED TO, READ BACK BY AND VERIFIED WITH: J GRIMSLEY PHARMD 08/11/18 2321 JDW    Enterobacter cloacae complex NOT DETECTED NOT DETECTED Final    Escherichia coli DETECTED (A) NOT DETECTED Final    Comment: CRITICAL RESULT CALLED TO, READ BACK BY AND VERIFIED WITH: J GRIMSLEY PHARMD 08/11/18 JDW    Klebsiella oxytoca NOT DETECTED NOT DETECTED Final   Klebsiella pneumoniae NOT DETECTED NOT DETECTED Final   Proteus species NOT DETECTED NOT DETECTED Final   Serratia marcescens NOT DETECTED NOT DETECTED Final   Carbapenem resistance NOT DETECTED NOT DETECTED Final   Haemophilus influenzae NOT DETECTED NOT DETECTED Final   Neisseria meningitidis NOT DETECTED NOT DETECTED Final   Pseudomonas aeruginosa NOT DETECTED NOT DETECTED Final  Candida albicans NOT DETECTED NOT DETECTED Final   Candida glabrata NOT DETECTED NOT DETECTED Final   Candida krusei NOT DETECTED NOT DETECTED Final   Candida parapsilosis NOT DETECTED NOT DETECTED Final   Candida tropicalis NOT DETECTED NOT DETECTED Final    Comment: Performed at [facility], [address], [place], [place] [address]  Urine culture     Status: Abnormal   Collection Time: 08/11/18  5:17 AM  Result Value Ref Range Status   Specimen Description   Final    URINE, CLEAN CATCH Performed at [facility], [address] [place], [place] [address]    Special Requests   Final    NONE Performed at [facility], [address] [place], [place] [address]    Culture >=100,000 COLONIES/mL ESCHERICHIA COLI (A)  Final   Report Status 08/13/2018 FINAL  Final   Organism ID, Bacteria ESCHERICHIA COLI (A)  Final      Susceptibility   Escherichia coli - MIC*    AMPICILLIN >=32 RESISTANT Resistant     CEFAZOLIN 16 SENSITIVE Sensitive     CEFTRIAXONE <=1 SENSITIVE Sensitive     CIPROFLOXACIN <=0.25 SENSITIVE Sensitive     GENTAMICIN <=1 SENSITIVE Sensitive     IMIPENEM <=0.25 SENSITIVE Sensitive     NITROFURANTOIN <=16 SENSITIVE Sensitive     TRIMETH/SULFA <=20 SENSITIVE Sensitive     AMPICILLIN/SULBACTAM >=32 RESISTANT Resistant     PIP/TAZO <=4 SENSITIVE Sensitive      Extended ESBL NEGATIVE Sensitive     * >=100,000 COLONIES/mL ESCHERICHIA COLI  SARS Coronavirus 2 (Hosp order,Performed in [facility] lab via [employer] [facility])     Status: None   Collection Time: 08/11/18  5:22 AM  Result Value Ref Range Status   SARS Coronavirus 2 ([employer] Now) NEGATIVE NEGATIVE Final    Comment: (NOTE) Interpretive Result Comment(s): COVID 19 Positive SARS CoV 2 target nucleic acids are DETECTED. The SARS CoV 2 RNA is generally detectable in upper and lower respiratory specimens during the acute phase of infection.  Positive results are indicative of active infection with SARS CoV 2.  Clinical correlation with patient history and other diagnostic information is necessary to determine patient infection status.  Positive results do not rule out bacterial infection or coinfection with other viruses. The expected result is Negative. COVID 19 Negative SARS CoV 2 target nucleic acids are NOT DETECTED. The SARS CoV 2 RNA is generally detectable in upper and lower respiratory specimens during the acute phase of infection.  Negative results do not preclude SARS CoV 2 infection, do not rule out coinfections with other pathogens, and should not be used as the sole basis for treatment or other patient management decisions.  Negative results must be combined with clinical  observations, patient history, and epidemiological information. The expected result is Negative. Invalid Presence or absence of SARS CoV 2 nucleic acids cannot be determined. Repeat testing was performed on the submitted specimen and repeated Invalid results were obtained.  If clinically indicated, additional testing on a new specimen with an alternate test methodology [phone]) is advised.  The SARS CoV 2 RNA is generally detectable in upper and lower respiratory specimens during the acute phase of infection. The expected result is Negative. Fact Sheet for Patients:   http://www.graves-ford.org/ Fact Sheet for Healthcare Providers: EnviroConcern.si This test is not yet approved or cleared by the [place] FDA and has been authorized for detection and/or diagnosis of SARS CoV 2  by FDA under an Emergency Use Authorization (EUA).  This EUA will remain in effect (meaning this test can be used) for the duration of the COVID19 d eclaration under Section 564(b)(1) of the Act, 21 U.S.C. section [phone] 3(b)(1), unless the authorization is terminated or revoked sooner. Performed at [facility], [address] [place], [place] [address]   Culture, blood (routine x 2)     Status: Abnormal   Collection Time: 08/11/18  7:34 AM  Result Value Ref Range Status   Specimen Description   Final    BLOOD LEFT ANTECUBITAL Performed at [facility], [address], [place], [place] [address]    Special Requests   Final    BOTTLES DRAWN AEROBIC AND ANAEROBIC Blood Culture adequate volume Performed at [facility], [address] [place], [place] [address]    Culture  Setup Time   Final    GRAM NEGATIVE RODS AEROBIC BOTTLE ONLY CRITICAL RESULT CALLED TO, READ BACK BY AND VERIFIED WITH: J. GRIMSLEY,PHARMD [id number] 08/12/2018 T. TYSOR    Culture (A)  Final    ESCHERICHIA COLI SUSCEPTIBILITIES PERFORMED ON PREVIOUS CULTURE WITHIN THE LAST 5 DAYS. Performed at [facility], [address] [place], [place] [address]    Report Status 08/14/2018 FINAL  Final  SARS Coronavirus 2 (CEPHEID - Performed in [facility] hospital lab), Hosp Order     Status: None   Collection Time: 08/11/18 10:51 AM  Result Value Ref Range Status   SARS Coronavirus 2 NEGATIVE NEGATIVE Final    Comment: (NOTE) If result is NEGATIVE SARS-CoV-2 target nucleic acids are NOT DETECTED. The SARS-CoV-2 RNA is generally detectable in upper and lower  respiratory specimens during the acute phase of infection. The lowest   concentration of SARS-CoV-2 viral copies this assay can detect is 250  copies / mL. A negative result does not preclude SARS-CoV-2 infection  and should not be used as the sole basis for treatment or other  patient management decisions.  A negative result may occur with  improper specimen collection / handling, submission of specimen other  than nasopharyngeal swab, presence of viral mutation(s) within the  areas targeted by this assay, and inadequate number of viral copies  (<250 copies / mL). A negative result must be combined with clinical  observations, patient history, and epidemiological information. If result is POSITIVE SARS-CoV-2 target nucleic acids are DETECTED. The SARS-CoV-2 RNA is generally detectable in upper and lower  respiratory specimens dur ing the acute phase of infection.  Positive  results are indicative of active infection with SARS-CoV-2.  Clinical  correlation with patient history and other diagnostic information is  necessary to determine patient infection status.  Positive results do  not rule out bacterial infection or co-infection with other viruses. If result is PRESUMPTIVE POSTIVE SARS-CoV-2 nucleic acids MAY BE PRESENT.   A presumptive positive result was obtained on the submitted specimen  and confirmed on repeat testing.  While 2019 novel coronavirus  (SARS-CoV-2) nucleic acids may be present in the submitted sample  additional confirmatory testing may be necessary for epidemiological  and / or clinical management purposes  to differentiate between  SARS-CoV-2 and other Sarbecovirus currently known to infect humans.  If clinically indicated additional testing with an alternate test  methodology [phone]) is advised. The SARS-CoV-2 RNA is generally  detectable in upper and lower respiratory sp ecimens during the acute  phase of infection. The expected result is Negative. Fact  Sheet for Patients:  BoilerBrush.com.cy Fact Sheet  for Healthcare Providers: https://pope.com/ This test is not yet approved or cleared by the [place] FDA and has been authorized for detection and/or diagnosis of SARS-CoV-2 by FDA under an Emergency Use Authorization (EUA).  This EUA will remain in effect (meaning this test can be used) for the duration of the COVID-19 declaration under Section 564(b)(1) of the Act, 21 U.S.C. section 360bbb-3(b)(1), unless the authorization is terminated or revoked sooner. Performed at [facility], [address] [place], [place] [address]   Culture, blood (routine x 2)     Status: None (Preliminary result)   Collection Time: 08/12/18  2:53 PM  Result Value Ref Range Status   Specimen Description   Final    RIGHT ANTECUBITAL Performed at [facility], [address] [place], [place] [address]    Special Requests   Final    BOTTLES DRAWN AEROBIC ONLY Blood Culture adequate volume Performed at [facility], [address], [place], [place] [address]    Culture   Final    NO GROWTH 2 DAYS Performed at [facility], [address] [place], [place] [address]    Report Status PENDING  Incomplete  Culture, blood (routine x 2)     Status: None (Preliminary result)   Collection Time: 08/12/18  2:55 PM  Result Value Ref Range Status   Specimen Description   Final    BLOOD RIGHT HAND Performed at [facility], [address], [place], [place] [address]    Special Requests   Final    BOTTLES DRAWN AEROBIC ONLY Blood Culture adequate volume Performed at [facility], [address], [place], [place] [address]    Culture   Final    NO GROWTH 2 DAYS Performed at [facility], [address], [place], [place] [address]    Report Status PENDING  Incomplete     Patient was seen and examined on the day of discharge and was found to be in stable condition. Time coordinating  discharge: 25 minutes including assessment and coordination of care, as well as examination of the patient.   SIGNED:  Noralee Stain, DO [facility] [email or web address] 08/14/2018, 8:32 AM

## 2018-08-15 DIAGNOSIS — R351 Nocturia: Secondary | ICD-10-CM | POA: Diagnosis not present

## 2018-08-15 DIAGNOSIS — N10 Acute tubulo-interstitial nephritis: Secondary | ICD-10-CM | POA: Diagnosis not present

## 2018-08-17 ENCOUNTER — Telehealth: Payer: Self-pay | Admitting: Adult Health

## 2018-08-17 LAB — CULTURE, BLOOD (ROUTINE X 2)
Culture: NO GROWTH
Culture: NO GROWTH
Special Requests: ADEQUATE
Special Requests: ADEQUATE

## 2018-08-17 NOTE — Telephone Encounter (Signed)
Medical Asst advised pt needed  OV w/ NON-Fasting Labs-- Offered next week 6/1-6/4  no Friday 6/5 our office is clsd ( Pt says will cb to rescheduled he needed all appointment for that day since he is off work on that day for a different Doctor's appt) ---forwarding msg as an Financial planner to Engineer, site.  --Isaiah Heath

## 2018-08-24 ENCOUNTER — Inpatient Hospital Stay: Payer: BLUE CROSS/BLUE SHIELD | Admitting: Adult Health

## 2018-08-25 DIAGNOSIS — R351 Nocturia: Secondary | ICD-10-CM | POA: Diagnosis not present

## 2018-08-25 DIAGNOSIS — N10 Acute tubulo-interstitial nephritis: Secondary | ICD-10-CM | POA: Diagnosis not present

## 2018-11-20 ENCOUNTER — Telehealth: Payer: Self-pay | Admitting: Adult Health

## 2018-11-20 NOTE — Telephone Encounter (Signed)
Patient called to set up Lab appt for Tuesday/ 9/8/ @ 3:00p , but there are no lab order in chart--- forwarding notice to medical assistant for review & to enter any orders.  --glh

## 2018-11-28 ENCOUNTER — Other Ambulatory Visit: Payer: BLUE CROSS/BLUE SHIELD

## 2018-11-28 ENCOUNTER — Other Ambulatory Visit: Payer: Self-pay

## 2018-11-28 DIAGNOSIS — Z83438 Family history of other disorder of lipoprotein metabolism and other lipidemia: Secondary | ICD-10-CM | POA: Diagnosis not present

## 2018-11-28 DIAGNOSIS — Z833 Family history of diabetes mellitus: Secondary | ICD-10-CM | POA: Diagnosis not present

## 2018-11-28 DIAGNOSIS — Z0000 Encounter for general adult medical examination without abnormal findings: Secondary | ICD-10-CM | POA: Diagnosis not present

## 2018-11-30 LAB — COMPREHENSIVE METABOLIC PANEL
ALT: 18 IU/L (ref 0–44)
AST: 24 IU/L (ref 0–40)
Albumin/Globulin Ratio: 1.9 (ref 1.2–2.2)
Albumin: 4.4 g/dL (ref 4.0–5.0)
Alkaline Phosphatase: 73 IU/L (ref 39–117)
BUN/Creatinine Ratio: 14 (ref 9–20)
BUN: 12 mg/dL (ref 6–24)
Bilirubin Total: 0.4 mg/dL (ref 0.0–1.2)
CO2: 22 mmol/L (ref 20–29)
Calcium: 9.3 mg/dL (ref 8.7–10.2)
Chloride: 103 mmol/L (ref 96–106)
Creatinine, Ser: 0.85 mg/dL (ref 0.76–1.27)
GFR calc Af Amer: 126 mL/min/{1.73_m2} (ref >59)
GFR calc non Af Amer: 109 mL/min/{1.73_m2} (ref >59)
Globulin, Total: 2.3 g/dL (ref 1.5–4.5)
Glucose: 90 mg/dL (ref 65–99)
Potassium: 4 mmol/L (ref 3.5–5.2)
Sodium: 137 mmol/L (ref 134–144)
Total Protein: 6.7 g/dL (ref 6.0–8.5)

## 2018-11-30 LAB — CBC WITH DIFFERENTIAL/PLATELET
Basophils Absolute: 0 10*3/uL (ref 0.0–0.2)
Basos: 1 %
EOS (ABSOLUTE): 0.3 10*3/uL (ref 0.0–0.4)
Eos: 4 %
Hematocrit: 38 % (ref 37.5–51.0)
Hemoglobin: 12.9 g/dL — ABNORMAL LOW (ref 13.0–17.7)
Immature Grans (Abs): 0 10*3/uL (ref 0.0–0.1)
Immature Granulocytes: 0 %
Lymphocytes Absolute: 2.2 10*3/uL (ref 0.7–3.1)
Lymphs: 32 %
MCH: 29.8 pg (ref 26.6–33.0)
MCHC: 33.9 g/dL (ref 31.5–35.7)
MCV: 88 fL (ref 79–97)
Monocytes Absolute: 0.6 10*3/uL (ref 0.1–0.9)
Monocytes: 9 %
Neutrophils Absolute: 3.6 10*3/uL (ref 1.4–7.0)
Neutrophils: 54 %
Platelets: 215 10*3/uL (ref 150–450)
RBC: 4.33 x10E6/uL (ref 4.14–5.80)
RDW: 11.2 % — ABNORMAL LOW (ref 11.6–15.4)
WBC: 6.7 10*3/uL (ref 3.4–10.8)

## 2018-11-30 LAB — LIPID PANEL
Chol/HDL Ratio: 1.9 ratio (ref 0.0–5.0)
Cholesterol, Total: 134 mg/dL (ref 100–199)
HDL: 72 mg/dL (ref >39)
LDL Chol Calc (NIH): 52 mg/dL (ref 0–99)
Triglycerides: 40 mg/dL (ref 0–149)
VLDL Cholesterol Cal: 10 mg/dL (ref 5–40)

## 2018-11-30 LAB — TSH: TSH: 1.2 u[IU]/mL (ref 0.450–4.500)

## 2018-11-30 LAB — HEMOGLOBIN A1C
Est. average glucose Bld gHb Est-mCnc: 103 mg/dL
Hgb A1c MFr Bld: 5.2 % (ref 4.8–5.6)

## 2018-12-05 ENCOUNTER — Other Ambulatory Visit: Payer: Self-pay | Admitting: Adult Health

## 2018-12-05 ENCOUNTER — Telehealth: Payer: Self-pay

## 2018-12-05 MED ORDER — NICOTINE 14 MG/24HR TD PT24: 14.0000 mg | MEDICATED_PATCH | Freq: Every day | TRANSDERMAL | 1 refills | Status: DC

## 2018-12-05 NOTE — Telephone Encounter (Signed)
Pt states that he is smoking about 6-7 cigarettes a day. He has not had any thoughts of harming himself or others. He has not tried Chantix or Nicoderm. Charyl Bigger, CMA

## 2019-01-08 ENCOUNTER — Other Ambulatory Visit: Payer: Self-pay

## 2019-01-08 ENCOUNTER — Ambulatory Visit (INDEPENDENT_AMBULATORY_CARE_PROVIDER_SITE_OTHER): Payer: BLUE CROSS/BLUE SHIELD | Admitting: Adult Health

## 2019-01-08 ENCOUNTER — Encounter: Payer: Self-pay | Admitting: Adult Health

## 2019-01-08 VITALS — BP 113/71 | HR 87 | Temp 99.2°F | Ht 74.0 in | Wt 147.4 lb

## 2019-01-08 DIAGNOSIS — K5900 Constipation, unspecified: Secondary | ICD-10-CM

## 2019-01-08 DIAGNOSIS — Z23 Encounter for immunization: Secondary | ICD-10-CM

## 2019-01-08 DIAGNOSIS — F419 Anxiety disorder, unspecified: Secondary | ICD-10-CM

## 2019-01-08 DIAGNOSIS — Z0000 Encounter for general adult medical examination without abnormal findings: Secondary | ICD-10-CM | POA: Diagnosis not present

## 2019-01-08 DIAGNOSIS — Z9189 Other specified personal risk factors, not elsewhere classified: Secondary | ICD-10-CM

## 2019-01-08 NOTE — Assessment & Plan Note (Signed)
He voiced concerned about current wt- 147 Body mass index is 18.93 kg/m. Reviewed wt's over the last 2 years- he fluctuates between 141-152 lbs

## 2019-01-08 NOTE — Progress Notes (Signed)
Subjective:    Patient ID: Isaiah Heath, male    DOB: 02-Mar-1981, 38 y.o.   MRN: [id number]  HPI:  Isaiah Heath is for CPE May 2020 Pyelonephritis- now established with Urologist- he denies any urinary sx's at present. Since starting once daily OTC Probiotic he reports resolution of GI pain- now has daily, twice daily BMs. He continue to smoke, approx 6 cigarettes/day- he will pick up Nicoderm patch. He continues to drink water all day, follows heart healthy diet. He voiced concerned about current wt- 147 Body mass index is 18.93 kg/m. Reviewed wt's over the last 2 years- he fluctuates between 141-152 lbs He denies excessive fatigue CBC- normal, he denies hematuria/hematochezia  11/28/2018 Labs: CBC- improved, stable  CMP-stable  A1c-WNL, 5.2  TSH-WNL, 1.200  The 10-year ASCVD risk score Denman George DC Jr., et al., 2013) is: 4.6%  Values used to calculate the score:   Age: 27 years   Sex: Male   Is Non-Hispanic African American: Yes   Diabetic: No   Tobacco smoker: Yes   Systolic Blood Pressure: 134 mmHg   Is BP treated: No   HDL Cholesterol: 72 mg/dL   Total Cholesterol: 756 mg/dL  EPP-29   Healthcare Maintenance: Colonoscopy-04/2017- recommend to repeat at age 74.  EGD recommended for unintentional wt loss Immunizations-Tdap updated today, he declined Influenza vaccination   Patient Care Team    Relationship Specialty Notifications Start End  Julaine Fusi, NP PCP - General Family Medicine  12/29/16     Patient Active Problem List   Diagnosis Date Noted  . History of weight change 01/08/2019  . Pyelonephritis 08/11/2018  . Hematuria 06/29/2018  . Abnormal urinalysis 06/29/2018  . Unilateral recurrent inguinal hernia without obstruction or gangrene 06/29/2018  . 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 Metatarsalgia of left foot 07/13/2017  . Edema of left foot 07/13/2017  . Left foot pain 07/13/2017  . Constipation 04/07/2017  . LLQ  pain 04/07/2017  . Anxiety 02/21/2017  . Excessive cerumen in ear canal, right 01/03/2017  . Acute pain of right shoulder 12/29/2016  . Hematochezia 11/23/2016  . Healthcare maintenance 11/23/2016  . Nocturia 11/23/2016  . Pain of left middle finger 11/23/2016  . Dizziness 11/23/2016     Past Medical History:  Diagnosis Date  . Anxiety   . Diverticulitis   . Inguinal hernia, left      Past Surgical History:  Procedure Laterality Date  . HERNIA REPAIR Left [place], [facility] surgery, Dr. Nelda Marseille  . INGUINAL HERNIA REPAIR Left 03/03/2016   Procedure: LEFT INGUNIAL HERNIA EXPLORATION;  Surgeon: Jimmye Norman, MD;  Location: [facility] OR;  Service: General;  Laterality: Left;  . PROSTATE BIOPSY     ok     Family History  Problem Relation Age of Onset  . Diabetes Mother   . Hypertension Mother   . Stroke Father   . Hypertension Father   . Diabetes Maternal Grandmother   . Hypertension Maternal Grandfather   . Hypertension Paternal Grandfather      Social History   Substance and Sexual Activity  Drug Use No     Social History   Substance and Sexual Activity  Alcohol Use No  . Frequency: Never     Social History   Tobacco Use  Smoking Status Current Some Day Smoker  . Packs/day: 0.25  . Years: 10.00  . Pack years: 2.50  . Types: Cigarettes  Smokeless Tobacco Never Used     Outpatient  Encounter Medications as of 01/08/2019  Medication Sig  . ibuprofen (ADVIL,MOTRIN) 200 MG tablet Take 400 mg by mouth every 6 (six) hours as needed for headache or mild pain.   . nicotine (NICODERM CQ) 14 mg/24hr patch Place 1 patch (14 mg total) onto the skin daily. Use for 6 weeks, then decrease to 7mg  strength (Patient not taking: Reported on 01/08/2019)   No facility-administered encounter medications on file as of 01/08/2019.     Allergies: Ciprofloxacin  Body mass index is 18.93 kg/m.  Blood pressure 113/71, pulse 87, temperature 99.2 F (37.3 C),  temperature source Oral, height 6\' 2"  (1.88 m), weight 147 lb 6.4 oz (66.9 kg), SpO2 96 %.  Review of Systems  Constitutional: Negative for activity change, appetite change, chills, diaphoresis, fatigue, fever and unexpected weight change.  HENT: Negative for congestion.        Excessive cerumen   Eyes: Negative for visual disturbance.  Respiratory: Negative for cough, chest tightness, shortness of breath and wheezing.   Cardiovascular: Negative for chest pain, palpitations and leg swelling.  Gastrointestinal: Negative for abdominal distention, abdominal pain, blood in stool, constipation, diarrhea, nausea and vomiting.  Endocrine: Negative for cold intolerance, heat intolerance, polydipsia, polyphagia and polyuria.  Genitourinary: Negative for difficulty urinating and flank pain.  Musculoskeletal: Negative for arthralgias, back pain, gait problem, joint swelling, myalgias, neck pain and neck stiffness.  Neurological: Negative for dizziness and headaches.  Hematological: Negative for adenopathy. Does not bruise/bleed easily.  Psychiatric/Behavioral: Negative for agitation, behavioral problems, confusion, decreased concentration, dysphoric mood, hallucinations, self-injury, sleep disturbance and suicidal ideas. The patient is not nervous/anxious and is not hyperactive.        Objective:   Physical Exam Vitals signs and nursing note reviewed.  Constitutional:      General: He is not in acute distress.    Appearance: Normal appearance. He is normal weight. He is not ill-appearing, toxic-appearing or diaphoretic.  HENT:     Head: Normocephalic and atraumatic.     Right Ear: No decreased hearing noted. There is impacted cerumen.     Left Ear: No decreased hearing noted. There is impacted cerumen.     Ears:     Comments: Ears flushed bilaterally, cerumen removed. Pt tolerated well     Nose: Nose normal.     Mouth/Throat:     Pharynx: No oropharyngeal exudate.  Eyes:     Extraocular  Movements: Extraocular movements intact.     Conjunctiva/sclera: Conjunctivae normal.     Pupils: Pupils are equal, round, and reactive to light.  Neck:     Musculoskeletal: Normal range of motion and neck supple.  Cardiovascular:     Rate and Rhythm: Normal rate and regular rhythm.     Pulses: Normal pulses.     Heart sounds: Normal heart sounds. No murmur. No friction rub. No gallop.   Pulmonary:     Effort: Pulmonary effort is normal. No respiratory distress.     Breath sounds: Normal breath sounds. No stridor. No wheezing, rhonchi or rales.  Chest:     Chest wall: No tenderness.  Abdominal:     General: Abdomen is flat. Bowel sounds are normal. There is no distension.     Palpations: Abdomen is soft. There is no mass.     Tenderness: There is no abdominal tenderness.     Hernia: No hernia is present.  Musculoskeletal: Normal range of motion.        General: No tenderness.  Skin:  General: Skin is warm and dry.     Findings: No erythema.  Neurological:     Mental Status: He is alert and oriented to person, place, and time.     Coordination: Coordination normal.  Psychiatric:        Mood and Affect: Mood normal.        Behavior: Behavior normal.        Thought Content: Thought content normal.        Judgment: Judgment normal.       Assessment & Plan:   1. Need for Tdap vaccination   2. Healthcare maintenance   3. Constipation, unspecified constipation type   4. Anxiety   5. History of weight change     Healthcare maintenance Your labs, blood pressure, weight are stable. Please start Nicoderm- you can quite tobacoo! Follow-up with Urologist and Gastroenterologist as needed. Continue your excellent hydration and healthy eating. Follow-up with annual physical and fasting labs. Continue to social distance and wear a mask when in public.  Constipation Since starting once daily OTC Probiotic he reports resolution of GI pain- now has daily, twice daily  BMs.  Anxiety Stable, not on medication   History of weight change He voiced concerned about current wt- 147 Body mass index is 18.93 kg/m. Reviewed wt's over the last 2 years- he fluctuates between 141-152 lbs    FOLLOW-UP:  Return in about 1 year (around 01/08/2020) for CPE.

## 2019-01-08 NOTE — Assessment & Plan Note (Signed)
- 

## 2019-01-08 NOTE — Assessment & Plan Note (Signed)
Your labs, blood pressure, weight are stable. Please start Nicoderm- you can quite tobacoo! Follow-up with Urologist and Gastroenterologist as needed. Continue your excellent hydration and healthy eating. Follow-up with annual physical and fasting labs. Continue to social distance and wear a mask when in public.

## 2019-01-08 NOTE — Assessment & Plan Note (Signed)
Since starting once daily OTC Probiotic he reports resolution of GI pain- now has daily, twice daily BMs.

## 2019-01-08 NOTE — Patient Instructions (Addendum)
Preventive Care for Adults, Male A healthy lifestyle and preventive care can promote health and wellness. Preventive health guidelines for men include the following key practices:  A routine yearly physical is a good way to check with your health care provider about your health and preventative screening. It is a chance to share any concerns and updates on your health and to receive a thorough exam.  Visit your dentist for a routine exam and preventative care every 6 months. Brush your teeth twice a day and floss once a day. Good oral hygiene prevents tooth decay and gum disease.  The frequency of eye exams is based on your age, health, family medical history, use of contact lenses, and other factors. Follow your health care provider's recommendations for frequency of eye exams.  Eat a healthy diet. Foods such as vegetables, fruits, whole grains, low-fat dairy products, and lean protein foods contain the nutrients you need without too many calories. Decrease your intake of foods high in solid fats, added sugars, and salt. Eat the right amount of calories for you.Get information about a proper diet from your health care provider, if necessary.  Regular physical exercise is one of the most important things you can do for your health. Most adults should get at least 150 minutes of moderate-intensity exercise (any activity that increases your heart rate and causes you to sweat) each week. In addition, most adults need muscle-strengthening exercises on 2 or more days a week.  Maintain a healthy weight. The body mass index (BMI) is a screening tool to identify possible weight problems. It provides an estimate of body fat based on height and weight. Your health care provider can find your BMI and can help you achieve or maintain a healthy weight.For adults 20 years and older:  A BMI below 18.5 is considered underweight.  A BMI of 18.5 to 24.9 is normal.  A BMI of 25 to 29.9 is considered  overweight.  A BMI of 30 and above is considered obese.  Maintain normal blood lipids and cholesterol levels by exercising and minimizing your intake of saturated fat. Eat a balanced diet with plenty of fruit and vegetables. Blood tests for lipids and cholesterol should begin at age 22 and be repeated every 5 years. If your lipid or cholesterol levels are high, you are over 50, or you are at high risk for heart disease, you may need your cholesterol levels checked more frequently.Ongoing high lipid and cholesterol levels should be treated with medicines if diet and exercise are not working.  If you smoke, find out from your health care provider how to quit. If you do not use tobacco, do not start.  Lung cancer screening is recommended for adults aged 87-80 years who are at high risk for developing lung cancer because of a history of smoking. A yearly low-dose CT scan of the lungs is recommended for people who have at least a 30-pack-year history of smoking and are a current smoker or have quit within the past 15 years. A pack year of smoking is smoking an average of 1 pack of cigarettes a day for 1 year (for example: 1 pack a day for 30 years or 2 packs a day for 15 years). Yearly screening should continue until the smoker has stopped smoking for at least 15 years. Yearly screening should be stopped for people who develop a health problem that would prevent them from having lung cancer treatment.  If you choose to drink alcohol, do not have more  than 2 drinks per day. One drink is considered to be 12 ounces (355 mL) of beer, 5 ounces (148 mL) of wine, or 1.5 ounces (44 mL) of liquor.  Avoid use of street drugs. Do not share needles with anyone. Ask for help if you need support or instructions about stopping the use of drugs.  High blood pressure causes heart disease and increases the risk of stroke. Your blood pressure should be checked at least every 1-2 years. Ongoing high blood pressure should be  treated with medicines, if weight loss and exercise are not effective.  If you are 22-93 years old, ask your health care provider if you should take aspirin to prevent heart disease.  Diabetes screening is done by taking a blood sample to check your blood glucose level after you have not eaten for a certain period of time (fasting). If you are not overweight and you do not have risk factors for diabetes, you should be screened once every 3 years starting at age 76. If you are overweight or obese and you are 75-14 years of age, you should be screened for diabetes every year as part of your cardiovascular risk assessment.  Colorectal cancer can be detected and often prevented. Most routine colorectal cancer screening begins at the age of 26 and continues through age 73. However, your health care provider may recommend screening at an earlier age if you have risk factors for colon cancer. On a yearly basis, your health care provider may provide home test kits to check for hidden blood in the stool. Use of a small camera at the end of a tube to directly examine the colon (sigmoidoscopy or colonoscopy) can detect the earliest forms of colorectal cancer. Talk to your health care provider about this at age 11, when routine screening begins. Direct exam of the colon should be repeated every 5-10 years through age 7, unless early forms of precancerous polyps or small growths are found.  People who are at an increased risk for hepatitis B should be screened for this virus. You are considered at high risk for hepatitis B if:  You were born in a country where hepatitis B occurs often. Talk with your health care provider about which countries are considered high risk.  Your parents were born in a high-risk country and you have not received a shot to protect against hepatitis B (hepatitis B vaccine).  You have HIV or AIDS.  You use needles to inject street drugs.  You live with, or have sex with, someone who  has hepatitis B.  You are a man who has sex with other men (MSM).  You get hemodialysis treatment.  You take certain medicines for conditions such as cancer, organ transplantation, and autoimmune conditions.  Hepatitis C blood testing is recommended for all people born from [birth year] through 1965 and any individual with known risks for hepatitis C.  Practice safe sex. Use condoms and avoid high-risk sexual practices to reduce the spread of sexually transmitted infections (STIs). STIs include gonorrhea, chlamydia, syphilis, trichomonas, herpes, HPV, and human immunodeficiency virus (HIV). Herpes, HIV, and HPV are viral illnesses that have no cure. They can result in disability, cancer, and death.  If you are a man who has sex with other men, you should be screened at least once per year for:  HIV.  Urethral, rectal, and pharyngeal infection of gonorrhea, chlamydia, or both.  If you are at risk of being infected with HIV, it is recommended that you take a  prescription medicine daily to prevent HIV infection. This is called preexposure prophylaxis (PrEP). You are considered at risk if:  You are a man who has sex with other men (MSM) and have other risk factors.  You are a heterosexual man, are sexually active, and are at increased risk for HIV infection.  You take drugs by injection.  You are sexually active with a partner who has HIV.  Talk with your health care provider about whether you are at high risk of being infected with HIV. If you choose to begin PrEP, you should first be tested for HIV. You should then be tested every 3 months for as long as you are taking PrEP.  A one-time screening for abdominal aortic aneurysm (AAA) and surgical repair of large AAAs by ultrasound are recommended for men ages 53 to 30 years who are current or former smokers.  Healthy men should no longer receive prostate-specific antigen (PSA) blood tests as part of routine cancer screening. Talk with your health  care provider about prostate cancer screening.  Testicular cancer screening is not recommended for adult males who have no symptoms. Screening includes self-exam, a health care provider exam, and other screening tests. Consult with your health care provider about any symptoms you have or any concerns you have about testicular cancer.  Use sunscreen. Apply sunscreen liberally and repeatedly throughout the day. You should seek shade when your shadow is shorter than you. Protect yourself by wearing long sleeves, pants, a wide-brimmed hat, and sunglasses year round, whenever you are outdoors.  Once a month, do a whole-body skin exam, using a mirror to look at the skin on your back. Tell your health care provider about new moles, moles that have irregular borders, moles that are larger than a pencil eraser, or moles that have changed in shape or color.  Stay current with required vaccines (immunizations).  Influenza vaccine. All adults should be immunized every year.  Tetanus, diphtheria, and acellular pertussis (Td, Tdap) vaccine. An adult who has not previously received Tdap or who does not know his vaccine status should receive 1 dose of Tdap. This initial dose should be followed by tetanus and diphtheria toxoids (Td) booster doses every 10 years. Adults with an unknown or incomplete history of completing a 3-dose immunization series with Td-containing vaccines should begin or complete a primary immunization series including a Tdap dose. Adults should receive a Td booster every 10 years.  Varicella vaccine. An adult without evidence of immunity to varicella should receive 2 doses or a second dose if he has previously received 1 dose.  Human papillomavirus (HPV) vaccine. Males aged 11-21 years who have not received the vaccine previously should receive the 3-dose series. Males aged 22-26 years may be immunized. Immunization is recommended through the age of 20 years for any male who has sex with males  and did not get any or all doses earlier. Immunization is recommended for any person with an immunocompromised condition through the age of 59 years if he did not get any or all doses earlier. During the 3-dose series, the second dose should be obtained 4-8 weeks after the first dose. The third dose should be obtained 24 weeks after the first dose and 16 weeks after the second dose.  Zoster vaccine. One dose is recommended for adults aged 63 years or older unless certain conditions are present.  Measles, mumps, and rubella (MMR) vaccine. Adults born before [birth year] generally are considered immune to measles and mumps. Adults born in [birth year]  or later should have 1 or more doses of MMR vaccine unless there is a contraindication to the vaccine or there is laboratory evidence of immunity to each of the three diseases. A routine second dose of MMR vaccine should be obtained at least 28 days after the first dose for students attending postsecondary schools, health care workers, or international travelers. People who received inactivated measles vaccine or an unknown type of measles vaccine during 1963-1967 should receive 2 doses of MMR vaccine. People who received inactivated mumps vaccine or an unknown type of mumps vaccine before 1979 and are at high risk for mumps infection should consider immunization with 2 doses of MMR vaccine. Unvaccinated health care workers born before [birth year] who lack laboratory evidence of measles, mumps, or rubella immunity or laboratory confirmation of disease should consider measles and mumps immunization with 2 doses of MMR vaccine or rubella immunization with 1 dose of MMR vaccine.  Pneumococcal 13-valent conjugate (PCV13) vaccine. When indicated, a person who is uncertain of his immunization history and has no record of immunization should receive the PCV13 vaccine. All adults 9 years of age and older should receive this vaccine. An adult aged 69 years or older who has certain medical  conditions and has not been previously immunized should receive 1 dose of PCV13 vaccine. This PCV13 should be followed with a dose of pneumococcal polysaccharide (PPSV23) vaccine. Adults who are at high risk for pneumococcal disease should obtain the PPSV23 vaccine at least 8 weeks after the dose of PCV13 vaccine. Adults older than 40 years of age who have normal immune system function should obtain the PPSV23 vaccine dose at least 1 year after the dose of PCV13 vaccine.  Pneumococcal polysaccharide (PPSV23) vaccine. When PCV13 is also indicated, PCV13 should be obtained first. All adults aged 79 years and older should be immunized. An adult younger than age 43 years who has certain medical conditions should be immunized. Any person who resides in a nursing home or long-term care facility should be immunized. An adult smoker should be immunized. People with an immunocompromised condition and certain other conditions should receive both PCV13 and PPSV23 vaccines. People with human immunodeficiency virus (HIV) infection should be immunized as soon as possible after diagnosis. Immunization during chemotherapy or radiation therapy should be avoided. Routine use of PPSV23 vaccine is not recommended for American Indians, [place] Natives, or people younger than 65 years unless there are medical conditions that require PPSV23 vaccine. When indicated, people who have unknown immunization and have no record of immunization should receive PPSV23 vaccine. One-time revaccination 5 years after the first dose of PPSV23 is recommended for people aged 19-64 years who have chronic kidney failure, nephrotic syndrome, asplenia, or immunocompromised conditions. People who received 1-2 doses of PPSV23 before age 70 years should receive another dose of PPSV23 vaccine at age 79 years or later if at least 5 years have passed since the previous dose. Doses of PPSV23 are not needed for people immunized with PPSV23 at or after age 55  years.  Meningococcal vaccine. Adults with asplenia or persistent complement component deficiencies should receive 2 doses of quadrivalent meningococcal conjugate (MenACWY-D) vaccine. The doses should be obtained at least 2 months apart. Microbiologists working with certain meningococcal bacteria, [employer] recruits, people at risk during an outbreak, and people who travel to or live in countries with a high rate of meningitis should be immunized. A first-year college student up through age 64 years who is living in a residence hall should receive a  dose if he did not receive a dose on or after his 16th birthday. Adults who have certain high-risk conditions should receive one or more doses of vaccine.  Hepatitis A vaccine. Adults who wish to be protected from this disease, have chronic liver disease, work with hepatitis A-infected animals, work in hepatitis A research labs, or travel to or work in countries with a high rate of hepatitis A should be immunized. Adults who were previously unvaccinated and who anticipate close contact with an international adoptee during the first 60 days after arrival in the [place] from a country with a high rate of hepatitis A should be immunized.  Hepatitis B vaccine. Adults should be immunized if they wish to be protected from this disease, are under age 34 years and have diabetes, have chronic liver disease, have had more than one sex partner in the past 6 months, may be exposed to blood or other infectious body fluids, are household contacts or sex partners of hepatitis B positive people, are clients or workers in certain care facilities, or travel to or work in countries with a high rate of hepatitis B.  Haemophilus influenzae type b (Hib) vaccine. A previously unvaccinated person with asplenia or sickle cell disease or having a scheduled splenectomy should receive 1 dose of Hib vaccine. Regardless of previous immunization, a recipient of a hematopoietic stem cell  transplant should receive a 3-dose series 6-12 months after his successful transplant. Hib vaccine is not recommended for adults with HIV infection. Preventive Service / Frequency Ages 77 to 55  Blood pressure check.** / Every 3-5 years.  Lipid and cholesterol check.** / Every 5 years beginning at age 66.  Hepatitis C blood test.** / For any individual with known risks for hepatitis C.  Skin self-exam. / Monthly.  Influenza vaccine. / Every year.  Tetanus, diphtheria, and acellular pertussis (Tdap, Td) vaccine.** / Consult your health care provider. 1 dose of Td every 10 years.  Varicella vaccine.** / Consult your health care provider.  HPV vaccine. / 3 doses over 6 months, if 45 or younger.  Measles, mumps, rubella (MMR) vaccine.** / You need at least 1 dose of MMR if you were born in 1957 or later. You may also need a second dose.  Pneumococcal 13-valent conjugate (PCV13) vaccine.** / Consult your health care provider.  Pneumococcal polysaccharide (PPSV23) vaccine.** / 1 to 2 doses if you smoke cigarettes or if you have certain conditions.  Meningococcal vaccine.** / 1 dose if you are age 81 to 79 years and a Market researcher living in a residence hall, or have one of several medical conditions. You may also need additional booster doses.  Hepatitis A vaccine.** / Consult your health care provider.  Hepatitis B vaccine.** / Consult your health care provider.  Haemophilus influenzae type b (Hib) vaccine.** / Consult your health care provider. Ages 6 to 58  Blood pressure check.** / Every year.  Lipid and cholesterol check.** / Every 5 years beginning at age 89.  Lung cancer screening. / Every year if you are aged 84-80 years and have a 30-pack-year history of smoking and currently smoke or have quit within the past 15 years. Yearly screening is stopped once you have quit smoking for at least 15 years or develop a health problem that would prevent you from having  lung cancer treatment.  Fecal occult blood test (FOBT) of stool. / Every year beginning at age 90 and continuing until age 73. You may not have to do  this test if you get a colonoscopy every 10 years.  Flexible sigmoidoscopy** or colonoscopy.** / Every 5 years for a flexible sigmoidoscopy or every 10 years for a colonoscopy beginning at age 25 and continuing until age 48.  Hepatitis C blood test.** / For all people born from [birth year] through 1965 and any individual with known risks for hepatitis C.  Skin self-exam. / Monthly.  Influenza vaccine. / Every year.  Tetanus, diphtheria, and acellular pertussis (Tdap/Td) vaccine.** / Consult your health care provider. 1 dose of Td every 10 years.  Varicella vaccine.** / Consult your health care provider.  Zoster vaccine.** / 1 dose for adults aged 56 years or older.  Measles, mumps, rubella (MMR) vaccine.** / You need at least 1 dose of MMR if you were born in 1959 or later. You may also need a second dose.  Pneumococcal 13-valent conjugate (PCV13) vaccine.** / Consult your health care provider.  Pneumococcal polysaccharide (PPSV23) vaccine.** / 1 to 2 doses if you smoke cigarettes or if you have certain conditions.  Meningococcal vaccine.** / Consult your health care provider.  Hepatitis A vaccine.** / Consult your health care provider.  Hepatitis B vaccine.** / Consult your health care provider.  Haemophilus influenzae type b (Hib) vaccine.** / Consult your health care provider. Ages 41 and over  Blood pressure check.** / Every year.  Lipid and cholesterol check.**/ Every 5 years beginning at age 50.  Lung cancer screening. / Every year if you are aged 36-80 years and have a 30-pack-year history of smoking and currently smoke or have quit within the past 15 years. Yearly screening is stopped once you have quit smoking for at least 15 years or develop a health problem that would prevent you from having lung cancer treatment.  Fecal  occult blood test (FOBT) of stool. / Every year beginning at age 49 and continuing until age 84. You may not have to do this test if you get a colonoscopy every 10 years.  Flexible sigmoidoscopy** or colonoscopy.** / Every 5 years for a flexible sigmoidoscopy or every 10 years for a colonoscopy beginning at age 11 and continuing until age 56.  Hepatitis C blood test.** / For all people born from [birth year] through 1967 and any individual with known risks for hepatitis C.  Abdominal aortic aneurysm (AAA) screening.** / A one-time screening for ages 51 to 2 years who are current or former smokers.  Skin self-exam. / Monthly.  Influenza vaccine. / Every year.  Tetanus, diphtheria, and acellular pertussis (Tdap/Td) vaccine.** / 1 dose of Td every 10 years.  Varicella vaccine.** / Consult your health care provider.  Zoster vaccine.** / 1 dose for adults aged 78 years or older.  Pneumococcal 13-valent conjugate (PCV13) vaccine.** / 1 dose for all adults aged 63 years and older.  Pneumococcal polysaccharide (PPSV23) vaccine.** / 1 dose for all adults aged 69 years and older.  Meningococcal vaccine.** / Consult your health care provider.  Hepatitis A vaccine.** / Consult your health care provider.  Hepatitis B vaccine.** / Consult your health care provider.  Haemophilus influenzae type b (Hib) vaccine.** / Consult your health care provider. **Family history and personal history of risk and conditions may change your health care provider's recommendations.   This information is not intended to replace advice given to you by your health care provider. Make sure you discuss any questions you have with your health care provider.   Document Released: 05/04/2001 Document Revised: 03/29/2014 Document Reviewed: 08/03/2010 Elsevier Interactive Patient Education 2016  [employer]   Your labs, blood pressure, weight are stable. Please start Nicoderm- you can quite tobacoo! Follow-up with Urologist  and Gastroenterologist as needed. Continue your excellent hydration and healthy eating. Follow-up with annual physical and fasting labs. Continue to social distance and wear a mask when in public. GREAT TO SEE YOU!  

## 2019-01-30 ENCOUNTER — Other Ambulatory Visit: Payer: Self-pay

## 2019-01-30 ENCOUNTER — Ambulatory Visit
Admission: EM | Admit: 2019-01-30 | Discharge: 2019-01-30 | Disposition: A | Discharge status: Home / Self Care | Payer: BLUE CROSS/BLUE SHIELD | Attending: Physician Assistant | Admitting: Physician Assistant

## 2019-01-30 DIAGNOSIS — X500XXA Overexertion from strenuous movement or load, initial encounter: Secondary | ICD-10-CM

## 2019-01-30 DIAGNOSIS — M25511 Pain in right shoulder: Secondary | ICD-10-CM | POA: Diagnosis not present

## 2019-01-30 DIAGNOSIS — S46811A Strain of other muscles, fascia and tendons at shoulder and upper arm level, right arm, initial encounter: Secondary | ICD-10-CM

## 2019-01-30 MED ORDER — METHOCARBAMOL 500 MG PO TABS: 500.0000 mg | ORAL_TABLET | Freq: Two times a day (BID) | ORAL | 0 refills | Status: DC

## 2019-01-30 MED ORDER — MELOXICAM 7.5 MG PO TABS: 7.5000 mg | ORAL_TABLET | Freq: Every day | ORAL | 0 refills | Status: DC

## 2019-01-30 NOTE — ED Triage Notes (Signed)
Pt c/o rt shoulder pain since 0430am Monday morning. 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 Denies any injuries

## 2019-01-30 NOTE — Discharge Instructions (Addendum)
Start Mobic. Do not take ibuprofen (motrin/advil)/ naproxen (aleve) while on mobic. Robaxin as needed, this can make you drowsy, so do not take if you are going to drive, operate heavy machinery, or make important decisions. Ice/heat compresses as needed. This can take up to 3-4 weeks to completely resolve, but you should be feeling better each week. Follow up with orthopedics if symptoms not improving for further evaluation needed.

## 2019-01-30 NOTE — ED Provider Notes (Signed)
[facility]    CSN: [id number] Arrival date & time: 01/30/19  5465      History   Chief Complaint Chief Complaint  Patient presents with  . Shoulder Pain    HPI Isaiah Heath is a 38 y.o. male.   38 year old male comes in for 2-day history of right shoulder pain.  No obvious injury/trauma.  States prior to symptom onset, he had pressure wash the house, and did yard work.  Work also requires heavy lifting.  States woke up in the morning with pain to the right shoulder, chest, thoracic back.  Has had decrease in range of motion due to pain.  Has radiation of pain to the elbow with certain movement, with occasional numbness/tingling to the posterior upper arm.  No obvious swelling, erythema, contusion.  Patient denies loss of grip strength.  Tried Aleve, Goody powder without relief.     Past Medical History:  Diagnosis Date  . Anxiety   . Diverticulitis   . Inguinal hernia, left     Patient Active Problem List   Diagnosis Date Noted  . History of weight change 01/08/2019  . Pyelonephritis 08/11/2018  . Hematuria 06/29/2018  . Abnormal urinalysis 06/29/2018  . Unilateral recurrent inguinal hernia without obstruction or gangrene 06/29/2018  . 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 Metatarsalgia of left foot 07/13/2017  . Edema of left foot 07/13/2017  . Left foot pain 07/13/2017  . Constipation 04/07/2017  . LLQ pain 04/07/2017  . Anxiety 02/21/2017  . Excessive cerumen in ear canal, right 01/03/2017  . Acute pain of right shoulder 12/29/2016  . Hematochezia 11/23/2016  . Healthcare maintenance 11/23/2016  . Nocturia 11/23/2016  . Pain of left middle finger 11/23/2016  . Dizziness 11/23/2016    Past Surgical History:  Procedure Laterality Date  . HERNIA REPAIR Left [place], [place] [facility], Dr. Wylene Simmer  . INGUINAL HERNIA REPAIR Left 03/03/2016   Procedure: LEFT INGUNIAL HERNIA EXPLORATION;  Surgeon: Judeth Horn, MD;   Location: [facility];  Service: General;  Laterality: Left;  . PROSTATE BIOPSY     ok       Home Medications    Prior to Admission medications   Medication Sig Start Date End Date Taking? Authorizing Provider  meloxicam (MOBIC) 7.5 MG tablet Take 1 tablet (7.5 mg total) by mouth daily. 01/30/19   Tasia Catchings, Frankie Scipio V, PA-C  methocarbamol (ROBAXIN) 500 MG tablet Take 1 tablet (500 mg total) by mouth 2 (two) times daily. 01/30/19   Ok Edwards, PA-C    Family History Family History  Problem Relation Age of Onset  . Diabetes Mother   . Hypertension Mother   . Stroke Father   . Hypertension Father   . Diabetes Maternal Grandmother   . Hypertension Maternal Grandfather   . Hypertension Paternal Grandfather     Social History Social History   Tobacco Use  . Smoking status: Current Some Day Smoker    Packs/day: 0.25    Years: 10.00    Pack years: 2.50    Types: Cigarettes  . Smokeless tobacco: Never Used  Substance Use Topics  . Alcohol use: No    Frequency: Never  . Drug use: No     Allergies   Ciprofloxacin   Review of Systems Review of Systems  Reason unable to perform ROS: See HPI as above.     Physical Exam Triage Vital Signs ED Triage Vitals [01/30/19 0859]  Enc Vitals Group  BP 123/87     Pulse Rate 78     Resp 18     Temp 98.2 F (36.8 C)     Temp Source Oral     SpO2 95 %     Weight      Height      Head Circumference      Peak Flow      Pain Score 10     Pain Loc      Pain Edu?      Excl. in GC?    No data found.  Updated Vital Signs BP 123/87 (BP Location: Left Arm)   Pulse 78   Temp 98.2 F (36.8 C) (Oral)   Resp 18   SpO2 95%   Physical Exam Constitutional:      General: He is not in acute distress.    Appearance: He is well-developed. He is not diaphoretic.  HENT:     Head: Normocephalic and atraumatic.  Eyes:     Conjunctiva/sclera: Conjunctivae normal.     Pupils: Pupils are equal, round, and reactive to light.  Neck:      Musculoskeletal: Normal range of motion and neck supple.  Cardiovascular:     Rate and Rhythm: Normal rate and regular rhythm.     Heart sounds: Normal heart sounds. No murmur. No friction rub. No gallop.   Pulmonary:     Effort: Pulmonary effort is normal. No accessory muscle usage or respiratory distress.     Breath sounds: Normal breath sounds. No stridor. No decreased breath sounds, wheezing, rhonchi or rales.  Musculoskeletal:     Comments: No swelling, erythema, warmth, contusion seen.  No tenderness to palpation of the spinous processes.  Tenderness to palpation of right neck, thoracic back.  Tenderness to palpation of right shoulder diffusely, as well as right lateral chest.  Decreased abduction, otherwise full internal, external rotation though with pain.  Strength normal and equal bilaterally of neck, shoulder, elbow, wrist.  Sensation intact and equal bilaterally.  Radial pulse 2+, cap refill less than 2 seconds.  Skin:    General: Skin is warm and dry.  Neurological:     Mental Status: He is alert and oriented to person, place, and time.      UC Treatments / Results  Labs (all labs ordered are listed, but only abnormal results are displayed) Labs Reviewed - No data to display  EKG   Radiology No results found.  Procedures Procedures (including critical care time)  Medications Ordered in UC Medications - No data to display  Initial Impression / Assessment and Plan / UC Course  I have reviewed the triage vital signs and the nursing notes.  Pertinent labs & imaging results that were available during my care of the patient were reviewed by me and considered in my medical decision making (see chart for details).    Start NSAID as directed for pain and inflammation. Muscle relaxant as needed. Ice/heat compresses. Discussed with patient this can take up to 3-4 weeks to resolve, but should be getting better each week. Return precautions given.   Final Clinical  Impressions(s) / UC Diagnoses   Final diagnoses:  Acute pain of right shoulder  Strain of right trapezius muscle, initial encounter   ED Prescriptions    Medication Sig Dispense Auth. Provider   meloxicam (MOBIC) 7.5 MG tablet Take 1 tablet (7.5 mg total) by mouth daily. 15 tablet Kemaria Dedic V, PA-C   methocarbamol (ROBAXIN) 500 MG tablet Take 1 tablet (  500 mg total) by mouth 2 (two) times daily. 20 tablet Belinda Fisher, PA-C     PDMP not reviewed this encounter.   Belinda Fisher, PA-C 01/30/19 1024

## 2019-05-01 DIAGNOSIS — Z20828 Contact with and (suspected) exposure to other viral communicable diseases: Secondary | ICD-10-CM | POA: Diagnosis not present

## 2019-07-01 ENCOUNTER — Encounter (HOSPITAL_BASED_OUTPATIENT_CLINIC_OR_DEPARTMENT_OTHER): Payer: Self-pay | Admitting: *Deleted

## 2019-07-01 ENCOUNTER — Emergency Department (HOSPITAL_BASED_OUTPATIENT_CLINIC_OR_DEPARTMENT_OTHER): Payer: BLUE CROSS/BLUE SHIELD

## 2019-07-01 ENCOUNTER — Emergency Department (HOSPITAL_BASED_OUTPATIENT_CLINIC_OR_DEPARTMENT_OTHER)
Admission: EM | Admit: 2019-07-01 | Discharge: 2019-07-01 | Disposition: A | Discharge status: Home / Self Care | Payer: BLUE CROSS/BLUE SHIELD | Attending: Emergency Medicine | Admitting: Emergency Medicine

## 2019-07-01 DIAGNOSIS — Z881 Allergy status to other antibiotic agents status: Secondary | ICD-10-CM | POA: Diagnosis not present

## 2019-07-01 DIAGNOSIS — R1031 Right lower quadrant pain: Secondary | ICD-10-CM | POA: Diagnosis not present

## 2019-07-01 DIAGNOSIS — N5089 Other specified disorders of the male genital organs: Secondary | ICD-10-CM | POA: Diagnosis not present

## 2019-07-01 DIAGNOSIS — R109 Unspecified abdominal pain: Secondary | ICD-10-CM | POA: Diagnosis not present

## 2019-07-01 DIAGNOSIS — I861 Scrotal varices: Secondary | ICD-10-CM | POA: Diagnosis not present

## 2019-07-01 DIAGNOSIS — N50811 Right testicular pain: Secondary | ICD-10-CM | POA: Insufficient documentation

## 2019-07-01 DIAGNOSIS — R609 Edema, unspecified: Secondary | ICD-10-CM | POA: Diagnosis not present

## 2019-07-01 DIAGNOSIS — R52 Pain, unspecified: Secondary | ICD-10-CM | POA: Diagnosis not present

## 2019-07-01 DIAGNOSIS — F17210 Nicotine dependence, cigarettes, uncomplicated: Secondary | ICD-10-CM | POA: Diagnosis not present

## 2019-07-01 LAB — COMPREHENSIVE METABOLIC PANEL WITH GFR
ALT: 17 U/L (ref 0–44)
AST: 20 U/L (ref 15–41)
Albumin: 4.1 g/dL (ref 3.5–5.0)
Alkaline Phosphatase: 63 U/L (ref 38–126)
Anion gap: 7 (ref 5–15)
BUN: 13 mg/dL (ref 6–20)
CO2: 26 mmol/L (ref 22–32)
Calcium: 9.3 mg/dL (ref 8.9–10.3)
Chloride: 103 mmol/L (ref 98–111)
Creatinine, Ser: 0.89 mg/dL (ref 0.61–1.24)
GFR calc Af Amer: >60 mL/min
GFR calc non Af Amer: >60 mL/min
Glucose, Bld: 79 mg/dL (ref 70–99)
Potassium: 3.9 mmol/L (ref 3.5–5.1)
Sodium: 136 mmol/L (ref 135–145)
Total Bilirubin: 0.3 mg/dL (ref 0.3–1.2)
Total Protein: 6.8 g/dL (ref 6.5–8.1)

## 2019-07-01 LAB — URINALYSIS, ROUTINE W REFLEX MICROSCOPIC
Bilirubin Urine: NEGATIVE
Glucose, UA: NEGATIVE mg/dL
Hgb urine dipstick: NEGATIVE
Ketones, ur: NEGATIVE mg/dL
Leukocytes,Ua: NEGATIVE
Nitrite: NEGATIVE
Protein, ur: NEGATIVE mg/dL
Specific Gravity, Urine: 1.02 (ref 1.005–1.030)
pH: 7.5 (ref 5.0–8.0)

## 2019-07-01 LAB — CBC WITH DIFFERENTIAL/PLATELET
Abs Immature Granulocytes: 0.01 10*3/uL (ref 0.00–0.07)
Basophils Absolute: 0 10*3/uL (ref 0.0–0.1)
Basophils Relative: 1 %
Eosinophils Absolute: 0.2 10*3/uL (ref 0.0–0.5)
Eosinophils Relative: 3 %
HCT: 41.3 % (ref 39.0–52.0)
Hemoglobin: 13 g/dL (ref 13.0–17.0)
Immature Granulocytes: 0 %
Lymphocytes Relative: 26 %
Lymphs Abs: 1.6 10*3/uL (ref 0.7–4.0)
MCH: 29.2 pg (ref 26.0–34.0)
MCHC: 31.5 g/dL (ref 30.0–36.0)
MCV: 92.8 fL (ref 80.0–100.0)
Monocytes Absolute: 0.5 10*3/uL (ref 0.1–1.0)
Monocytes Relative: 8 %
Neutro Abs: 3.8 10*3/uL (ref 1.7–7.7)
Neutrophils Relative %: 62 %
Platelets: 193 10*3/uL (ref 150–400)
RBC: 4.45 MIL/uL (ref 4.22–5.81)
RDW: 11.9 % (ref 11.5–15.5)
WBC: 6.1 10*3/uL (ref 4.0–10.5)
nRBC: 0 % (ref 0.0–0.2)

## 2019-07-01 LAB — LIPASE, BLOOD: Lipase: 25 U/L (ref 11–51)

## 2019-07-01 LAB — HIV ANTIBODY (ROUTINE TESTING W REFLEX): HIV Screen 4th Generation wRfx: NONREACTIVE

## 2019-07-01 MED ORDER — ONDANSETRON HCL 4 MG/2ML IJ SOLN
4.0000 mg | Freq: Once | INTRAMUSCULAR | Status: AC
  Administered 2019-07-01: 4 mg via INTRAVENOUS
  Filled 2019-07-01: qty 2

## 2019-07-01 MED ORDER — MORPHINE SULFATE (PF) 4 MG/ML IV SOLN
4.0000 mg | Freq: Once | INTRAVENOUS | Status: AC
  Administered 2019-07-01: 4 mg via INTRAVENOUS
  Filled 2019-07-01: qty 1

## 2019-07-01 MED ORDER — IOHEXOL 300 MG/ML  SOLN
100.0000 mL | Freq: Once | INTRAMUSCULAR | Status: AC | PRN
  Administered 2019-07-01: 16:00:00 100 mL via INTRAVENOUS

## 2019-07-01 MED ORDER — ONDANSETRON 4 MG PO TBDP: 4.0000 mg | ORAL_TABLET | Freq: Three times a day (TID) | ORAL | 0 refills | Status: DC | PRN

## 2019-07-01 NOTE — ED Provider Notes (Signed)
[facility] EMERGENCY DEPARTMENT Provider Note   CSN: [id number] Arrival date & time: 07/01/19  1208     History Chief Complaint  Patient presents with  . Abdominal Pain    Isaiah Heath is a 39 y.o. male.  HPI  Patient is a 39 year old male with a history of hernia in 2010 with left hernia repair 2017 no other abdominal surgeries.  History of diverticulitis and anxiety.  Patient presents today for sudden onset of right lower quadrant abdominal pain, right testicular pain that is achy, tender to touch, associated with swelling of right testicle.  This began rather abruptly at 10 AM this morning when he was doing some heavy lifting and pushing.  Patient denies any history of testicular torsion in the past.  Has not had any history of sexually transmitted diseases.  Denies any symptoms prior to onset at 10 AM.  Denies any penile discharge, dysuria, frequency, urgency.  He states he also has some pain in his epigastrium.  He has no nausea, vomiting, diarrhea.  No hematochezia or melena.  Patient states he has a history of diverticulitis however states that this feels very different from that.  No chest pain or shortness of breath, no headache or dizziness, no numbness or weakness of legs.     Past Medical History:  Diagnosis Date  . Anxiety   . Diverticulitis   . Inguinal hernia, left     Patient Active Problem List   Diagnosis Date Noted  . History of weight change 01/08/2019  . Pyelonephritis 08/11/2018  . Hematuria 06/29/2018  . Abnormal urinalysis 06/29/2018  . Unilateral recurrent inguinal hernia without obstruction or gangrene 06/29/2018  . 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 Metatarsalgia of left foot 07/13/2017  . Edema of left foot 07/13/2017  . Left foot pain 07/13/2017  . Constipation 04/07/2017  . LLQ pain 04/07/2017  . Anxiety 02/21/2017  . Excessive cerumen in ear canal, right 01/03/2017  . Acute pain of right shoulder 12/29/2016  .  Hematochezia 11/23/2016  . Healthcare maintenance 11/23/2016  . Nocturia 11/23/2016  . Pain of left middle finger 11/23/2016  . Dizziness 11/23/2016    Past Surgical History:  Procedure Laterality Date  . HERNIA REPAIR Left [place], [place] [facility], Dr. Nelda Marseille  . INGUINAL HERNIA REPAIR Left 03/03/2016   Procedure: LEFT INGUNIAL HERNIA EXPLORATION;  Surgeon: Jimmye Norman, MD;  Location: [facility] OR;  Service: General;  Laterality: Left;  . PROSTATE BIOPSY     ok       Family History  Problem Relation Age of Onset  . Diabetes Mother   . Hypertension Mother   . Stroke Father   . Hypertension Father   . Diabetes Maternal Grandmother   . Hypertension Maternal Grandfather   . Hypertension Paternal Grandfather     Social History   Tobacco Use  . Smoking status: Current Some Day Smoker    Packs/day: 0.25    Years: 10.00    Pack years: 2.50    Types: Cigarettes  . Smokeless tobacco: Never Used  Substance Use Topics  . Alcohol use: No  . Drug use: No    Home Medications Prior to Admission medications   Medication Sig Start Date End Date Taking? Authorizing Provider  ondansetron (ZOFRAN ODT) 4 MG disintegrating tablet Take 1 tablet (4 mg total) by mouth every 8 (eight) hours as needed for nausea or vomiting. 07/01/19   Gailen Shelter, PA    Allergies  Ciprofloxacin  Review of Systems   Review of Systems  Constitutional: Negative for chills and fever.  HENT: Negative for congestion.   Eyes: Negative for pain.  Respiratory: Negative for cough and shortness of breath.   Cardiovascular: Negative for chest pain and leg swelling.  Gastrointestinal: Positive for abdominal pain. Negative for anal bleeding, blood in stool, constipation, diarrhea, nausea and vomiting.  Genitourinary: Positive for scrotal swelling and testicular pain. Negative for discharge, dysuria, penile pain and penile swelling.  Musculoskeletal: Negative for myalgias.  Skin: Negative for  rash.  Neurological: Negative for dizziness and headaches.    Physical Exam Updated Vital Signs BP 133/86 (BP Location: Right Arm)   Pulse 69   Temp 98.3 F (36.8 C) (Oral)   Resp 16   Ht 6\' 2"  (1.88 m)   Wt 68.5 kg   SpO2 100%   BMI 19.39 kg/m   Physical Exam Vitals and nursing note reviewed.  Constitutional:      General: He is in acute distress.  HENT:     Head: Normocephalic and atraumatic.     Nose: Nose normal.     Mouth/Throat:     Mouth: Mucous membranes are moist.  Eyes:     General: No scleral icterus. Cardiovascular:     Rate and Rhythm: Normal rate and regular rhythm.     Pulses: Normal pulses.     Heart sounds: Normal heart sounds.  Pulmonary:     Effort: Pulmonary effort is normal. No respiratory distress.     Breath sounds: Normal breath sounds. No wheezing.  Abdominal:     Palpations: Abdomen is soft.     Tenderness: There is abdominal tenderness in the right lower quadrant and epigastric area. There is no right CVA tenderness, left CVA tenderness or rebound. Positive signs include McBurney's sign. Negative signs include Murphy's sign, Rovsing's sign and obturator sign.     Hernia: No hernia is present.  Genitourinary:    Comments: Penis without any ulcers, lesions, abrasions or lacerations.  No discharge from urethral meatus.  Testes appear grossly normal.  There is tenderness to palpation of the right testicle.  Cremasteric reflex intact bilaterally. Musculoskeletal:     Cervical back: Normal range of motion.     Right lower leg: No edema.     Left lower leg: No edema.  Skin:    General: Skin is warm and dry.     Capillary Refill: Capillary refill takes less than 2 seconds.  Neurological:     Mental Status: He is alert. Mental status is at baseline.  Psychiatric:        Mood and Affect: Mood normal.        Behavior: Behavior normal.     ED Results / Procedures / Treatments   Labs (all labs ordered are listed, but only abnormal results are  displayed) Labs Reviewed  URINE CULTURE  CBC WITH DIFFERENTIAL/PLATELET  COMPREHENSIVE METABOLIC PANEL  LIPASE, BLOOD  URINALYSIS, ROUTINE W REFLEX MICROSCOPIC  HIV ANTIBODY (ROUTINE TESTING W REFLEX)  RPR  GC/CHLAMYDIA PROBE AMP ([place]) NOT AT [facility]    EKG None  Radiology CT ABDOMEN PELVIS W CONTRAST  Result Date: 07/01/2019 CLINICAL DATA:  Right lower quadrant abdominal pain EXAM: CT ABDOMEN AND PELVIS WITH CONTRAST TECHNIQUE: Multidetector CT imaging of the abdomen and pelvis was performed using the standard protocol following bolus administration of intravenous contrast. CONTRAST:  08/31/2019 OMNIPAQUE IOHEXOL 300 MG/ML  SOLN COMPARISON:  08/11/2018 FINDINGS: Lower chest: Lung bases are clear.  Hepatobiliary: Liver is within normal limits. Gallbladder is unremarkable. No intrahepatic or extrahepatic ductal dilatation. Pancreas: Within normal limits. Spleen: Within normal limits. Adrenals/Urinary Tract: Adrenal glands are within normal limits. Kidneys are within normal limits.  No hydronephrosis. Bladder is within normal limits. Stomach/Bowel: Stomach is within normal limits. No evidence of bowel obstruction. Appendix is within normal limits (series 2/images 53 and 58). No colonic wall thickening or inflammatory changes. Vascular/Lymphatic: No evidence of abdominal aortic aneurysm. No suspicious abdominopelvic lymphadenopathy. Reproductive: Prostate is unremarkable. Other: No abdominopelvic ascites. Musculoskeletal: Mild degenerative changes at L5-S1. IMPRESSION: Negative CT abdomen/pelvis. Specifically, no evidence of appendicitis. No CT findings to account for the patient's right lower quadrant abdominal pain. Electronically Signed   By: Charline Bills M.D.   On: 07/01/2019 16:27   US SCROTUM W/DOPPLER  Result Date: 07/01/2019 CLINICAL DATA:  Right testicular pain and swelling after heavy lifting EXAM: SCROTAL ULTRASOUND DOPPLER ULTRASOUND OF THE TESTICLES TECHNIQUE: Complete ultrasound  examination of the testicles, epididymis, and other scrotal structures was performed. Color and spectral Doppler ultrasound were also utilized to evaluate blood flow to the testicles. COMPARISON:  06/30/2018 FINDINGS: Right testicle Measurements: 4.3 x 2.3 x 3.1 cm. Normal echogenicity. No mass or microlithiasis visualized. Left testicle Measurements: 3.7 x 1.8 x 2.5 cm. Normal echogenicity. No mass or microlithiasis visualized. Right epididymis:  Normal in size and appearance. Left epididymis:  Normal in size and appearance. Hydrocele:  None visualized. Varicocele:  Left greater than right. Right testis appears relatively hypervascular compared to the left with color Doppler. Pulsed Doppler interrogation of both testes demonstrates normal low resistance arterial and venous waveforms bilaterally. IMPRESSION: 1. Negative for testicular torsion or intratesticular mass. 2. Bilateral varicoceles, left greater than right. 3. Right testis appears relatively hypervascular compared to the left. Findings could be reactive secondary to varicocele. Early changes of orchitis are not excluded. Electronically Signed   By: Duanne Guess D.O.   On: 07/01/2019 14:19    Procedures Procedures (including critical care time)  Medications Ordered in ED Medications  morphine 4 MG/ML injection 4 mg (4 mg Intravenous Given 07/01/19 1329)  ondansetron (ZOFRAN) injection 4 mg (4 mg Intravenous Given 07/01/19 1328)  iohexol (OMNIPAQUE) 300 MG/ML solution 100 mL (100 mLs Intravenous Contrast Given 07/01/19 1545)    ED Course  I have reviewed the triage vital signs and the nursing notes.  Pertinent labs & imaging results that were available during my care of the patient were reviewed by me and considered in my medical decision making (see chart for details).  Patient is 39 year old male with no pertinent past medical history apart from surgery in 2010 for left hernia repair and repeat left hernia repair in 2017.  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  He states he had no symptoms prior to this time of onset.  He denies any penile discharge, conservators STIs, difficulty urinating, frequency, urgency hematuria fevers or chills.  Denies any changes in his bowel movements.  Broad differential diagnosis considered including but emergently testicular torsion and ultrasound was ordered to rule out torsion.  Patient does have intact cremasteric reflex and his tenderness seems to be more above the testicle than on the actual testicle itself.  Some concern for epididymitis VS orchitis VS varicocele VS traumatic injury to the testicle.  Clinical Course as of Jun 30 2316  Sun Jul 01, 2019  1657 CBC  without leukocytosis, anemia or other acute abnormality.CMP without electrolyte abnormalityUA completely benign with no abnormalities.Lipase unremarkable within normal limits, HIV, RPR, GC chlamydia, urine culture pending at time of discharge.   [WF]  1700 Testicular ultrasound reviewed by myself.  There is no evidence of torsion.  Patient does have bilateral varicoceles.  Discussed case at length today physician Dr. Tyrone Nine.  As he does have right lower quadrant abdominal pain there is concern that he has abdominal pathology radiating to his testicles or other than testicular pain radiating to his abdomen as initially suspected.  Will obtain noncontrast CT abdomen pelvis and continue to reassess.  Patient is agreeable with plan.  Pain is well controlled this time he denies any nausea after Zofran.   [WF]  2311 CT abdomen pelvis with contrast independently viewed myself.  Agree with radiologist read there is no acute abnormality to explain patient's right lower quadrant abdominal pain  See radiologist read below  :  Negative CT abdomen/pelvis.  Specifically, no evidence of appendicitis.  No CT findings to account for the  patient's right lower quadrant abdominal pain.   [WF]  2312 Patient reassessed at this time.  I discussed the CT scan which shows no acute abnormality.  I also discussed the ultrasound shows varicoceles and a area of hypervascularity in his right testicle.  We discussed results and my concern for infection versus varicocele causing his symptoms.  Plan is to discharge patient with urology follow-up.  He states that he will call tomorrow morning to make an appointment.  Discharged with Zofran   [WF]    Clinical Course User Index [WF] Tedd Sias, [place]   CT abdomen pelvis with contrast independently viewed myself.  Agree with radiologist read there is no acute abnormality to explain patient's right lower quadrant abdominal pain  See radiologist read below  :  Negative CT abdomen/pelvis.  Specifically, no evidence of appendicitis.  No CT findings to account for the patient's right lower quadrant abdominal pain.   [WF]  Patient reassessed at this time.  I discussed with patient the CT scan which shows no acute abnormality.  I also discussed the ultrasound which shows varicoceles and a area of hypervascularity in his right testicle.  We discussed results and my concern for infection versus varicocele causing his symptoms.  Plan is to discharge patient with urology follow-up.  He states that he will call tomorrow morning to make an appointment.  Discharged with Zofran.   [WF]     MDM Rules/Calculators/A&P                       I discussed this case with my attending physician who cosigned this note including patient's presenting symptoms, physical exam, and planned diagnostics and interventions. Attending physician stated agreement with plan or made changes to plan which were implemented.   Patient discharged with strict return precautions.  He will return to ED if he has any new or concerning symptoms however will follow closely with urology.  Final Clinical Impression(s) / ED  Diagnoses Final diagnoses:  Right lower quadrant abdominal pain  Testicular pain, right    Rx / DC Orders ED Discharge Orders         Ordered    ondansetron (ZOFRAN ODT) 4 MG disintegrating tablet  Every 8 hours PRN     07/01/19 1646           Tedd Sias, [place] 07/01/19 2319    Deno Etienne, DO 07/02/19  0807  

## 2019-07-01 NOTE — ED Notes (Signed)
Attempted to get patient for U/S but told patient needed meds/labs first.  Will come back and do portably due to pain level

## 2019-07-01 NOTE — Discharge Instructions (Addendum)
Please follow-up with Dr. Ilsa Iha urology for evaluation.  Testicular pain.  You are found to have variceals which are fluid collections in your testicles.  This may or may not account for your symptoms today.  Your ultrasound shows that there is no evidence of testicular torsion.  Your CT scan showed no evidence of appendicitis.  Please call tomorrow morning to make an appoint with Dr. Ilsa Iha.  Please return to ED for any new or concerning symptoms.

## 2019-07-01 NOTE — ED Triage Notes (Signed)
Went to work this am, pushing and pulling 500-600lbs of paper, went to restroom to void and noted Rt testicle was swollen, very sensitive to touch now, and painful

## 2019-07-02 LAB — URINE CULTURE: Culture: NO GROWTH

## 2019-07-02 LAB — GC/CHLAMYDIA PROBE AMP (~~LOC~~) NOT AT ARMC
Chlamydia: NEGATIVE
Comment: NEGATIVE
Comment: NORMAL
Neisseria Gonorrhea: NEGATIVE

## 2019-07-02 LAB — RPR: RPR Ser Ql: NONREACTIVE

## 2019-11-16 ENCOUNTER — Other Ambulatory Visit: Payer: Self-pay

## 2019-11-16 ENCOUNTER — Emergency Department (HOSPITAL_COMMUNITY)
Admission: EM | Admit: 2019-11-16 | Discharge: 2019-11-17 | Disposition: A | Discharge status: Home / Self Care | Payer: BLUE CROSS/BLUE SHIELD | Source: Home / Self Care | Attending: Emergency Medicine | Admitting: Emergency Medicine

## 2019-11-16 ENCOUNTER — Other Ambulatory Visit: Payer: Self-pay | Admitting: Sleep Medicine

## 2019-11-16 ENCOUNTER — Encounter (HOSPITAL_COMMUNITY): Payer: Self-pay | Admitting: Emergency Medicine

## 2019-11-16 DIAGNOSIS — G4700 Insomnia, unspecified: Secondary | ICD-10-CM | POA: Diagnosis not present

## 2019-11-16 DIAGNOSIS — F332 Major depressive disorder, recurrent severe without psychotic features: Secondary | ICD-10-CM | POA: Diagnosis not present

## 2019-11-16 DIAGNOSIS — F329 Major depressive disorder, single episode, unspecified: Secondary | ICD-10-CM | POA: Insufficient documentation

## 2019-11-16 DIAGNOSIS — F1420 Cocaine dependence, uncomplicated: Secondary | ICD-10-CM | POA: Diagnosis not present

## 2019-11-16 DIAGNOSIS — Z5321 Procedure and treatment not carried out due to patient leaving prior to being seen by health care provider: Secondary | ICD-10-CM | POA: Insufficient documentation

## 2019-11-16 DIAGNOSIS — F17210 Nicotine dependence, cigarettes, uncomplicated: Secondary | ICD-10-CM | POA: Diagnosis not present

## 2019-11-16 DIAGNOSIS — F419 Anxiety disorder, unspecified: Secondary | ICD-10-CM | POA: Diagnosis not present

## 2019-11-16 DIAGNOSIS — F411 Generalized anxiety disorder: Secondary | ICD-10-CM | POA: Diagnosis not present

## 2019-11-16 DIAGNOSIS — I10 Essential (primary) hypertension: Secondary | ICD-10-CM | POA: Diagnosis not present

## 2019-11-16 DIAGNOSIS — Z20822 Contact with and (suspected) exposure to covid-19: Secondary | ICD-10-CM | POA: Diagnosis not present

## 2019-11-16 LAB — RAPID URINE DRUG SCREEN, HOSP PERFORMED
Amphetamines: NOT DETECTED
Barbiturates: NOT DETECTED
Benzodiazepines: NOT DETECTED
Cocaine: POSITIVE — AB
Opiates: NOT DETECTED
Tetrahydrocannabinol: NOT DETECTED

## 2019-11-16 LAB — COMPREHENSIVE METABOLIC PANEL
ALT: 44 U/L (ref 0–44)
AST: 93 U/L — ABNORMAL HIGH (ref 15–41)
Albumin: 4.9 g/dL (ref 3.5–5.0)
Alkaline Phosphatase: 82 U/L (ref 38–126)
Anion gap: 9 (ref 5–15)
BUN: 15 mg/dL (ref 6–20)
CO2: 28 mmol/L (ref 22–32)
Calcium: 9.6 mg/dL (ref 8.9–10.3)
Chloride: 103 mmol/L (ref 98–111)
Creatinine, Ser: 0.96 mg/dL (ref 0.61–1.24)
GFR calc Af Amer: >60 mL/min (ref >60)
GFR calc non Af Amer: >60 mL/min (ref >60)
Glucose, Bld: 114 mg/dL — ABNORMAL HIGH (ref 70–99)
Potassium: 3.9 mmol/L (ref 3.5–5.1)
Sodium: 140 mmol/L (ref 135–145)
Total Bilirubin: 0.6 mg/dL (ref 0.3–1.2)
Total Protein: 8.8 g/dL — ABNORMAL HIGH (ref 6.5–8.1)

## 2019-11-16 LAB — CBC WITH DIFFERENTIAL/PLATELET
Abs Immature Granulocytes: 0.03 10*3/uL (ref 0.00–0.07)
Basophils Absolute: 0.1 10*3/uL (ref 0.0–0.1)
Basophils Relative: 1 %
Eosinophils Absolute: 0 10*3/uL (ref 0.0–0.5)
Eosinophils Relative: 0 %
HCT: 46.3 % (ref 39.0–52.0)
Hemoglobin: 14.8 g/dL (ref 13.0–17.0)
Immature Granulocytes: 0 %
Lymphocytes Relative: 12 %
Lymphs Abs: 1.2 10*3/uL (ref 0.7–4.0)
MCH: 29.5 pg (ref 26.0–34.0)
MCHC: 32 g/dL (ref 30.0–36.0)
MCV: 92.4 fL (ref 80.0–100.0)
Monocytes Absolute: 0.6 10*3/uL (ref 0.1–1.0)
Monocytes Relative: 6 %
Neutro Abs: 8.3 10*3/uL — ABNORMAL HIGH (ref 1.7–7.7)
Neutrophils Relative %: 81 %
Platelets: 270 10*3/uL (ref 150–400)
RBC: 5.01 MIL/uL (ref 4.22–5.81)
RDW: 12.1 % (ref 11.5–15.5)
WBC: 10.2 10*3/uL (ref 4.0–10.5)
nRBC: 0 % (ref 0.0–0.2)

## 2019-11-16 LAB — SARS CORONAVIRUS 2 BY RT PCR (HOSPITAL ORDER, PERFORMED IN ~~LOC~~ HOSPITAL LAB): SARS Coronavirus 2: NEGATIVE

## 2019-11-16 LAB — ETHANOL: Alcohol, Ethyl (B): <10 mg/dL (ref <10)

## 2019-11-16 MED ORDER — ACETAMINOPHEN 325 MG PO TABS: 650.0000 mg | ORAL_TABLET | ORAL | Status: DC | PRN

## 2019-11-16 NOTE — ED Provider Notes (Cosign Needed)
[facility]-EMERGENCY DEPT Provider Note   CSN: [id number] Arrival date & time: 11/16/19  1817     History Chief Complaint  Patient presents with  . Anxiety  . IVC    Isaiah Heath is a 39 y.o. male  with past medical history of anxiety, brought into the ED by [facility] for IVC.  Patient's wife carried out IVC on patient for progressively worsening anxiety, volatile moods with explosive anger, and concerning comments made by the patient.  She states his grandmother passed away 2 days ago from Covid, and his mother is currently in the hospital with same illness.  She states he made the comment last night "I might as well be dead.".  His wife states patient "yells, stomps, slams doors when he is angry.  She states he has been expressing he has been unhappy with his life.  She states during an argument today patient stated "both of [place] will be in a casket."  She is unsure if he is using any drugs.  She states he has been drinking beer every day since his grandmother passed.  Patient reports increased anxiety since his grandmother passed 2 days ago.  She states he was his "rock."  He states he has been having significant difficulty sleeping since she passed, when he lays down at night his mind is racing.  He believes he is functioning well, going to work and taking care of his family.  He is not taking any anxiety medications recently.  He reports he was on Zoloft some years ago and thinks he may need to go back on it.  His wife reported this medication he took for 1 month in the past and then stopped taking it.  Patient denies SI or HI.  The history is provided by the patient and the spouse.       Past Medical History:  Diagnosis Date  . Anxiety   . Diverticulitis   . Inguinal hernia, left     Patient Active Problem List   Diagnosis Date Noted  . History of weight change 01/08/2019  . Pyelonephritis 08/11/2018  . Hematuria 06/29/2018  . Abnormal urinalysis  06/29/2018  . Unilateral recurrent inguinal hernia without obstruction or gangrene 06/29/2018  . 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 Metatarsalgia of left foot 07/13/2017  . Edema of left foot 07/13/2017  . Left foot pain 07/13/2017  . Constipation 04/07/2017  . LLQ pain 04/07/2017  . Anxiety 02/21/2017  . Excessive cerumen in ear canal, right 01/03/2017  . Acute pain of right shoulder 12/29/2016  . Hematochezia 11/23/2016  . Healthcare maintenance 11/23/2016  . Nocturia 11/23/2016  . Pain of left middle finger 11/23/2016  . Dizziness 11/23/2016    Past Surgical History:  Procedure Laterality Date  . HERNIA REPAIR Left [place], [place] [facility] surgery, Dr. Nelda Marseille  . INGUINAL HERNIA REPAIR Left 03/03/2016   Procedure: LEFT INGUNIAL HERNIA EXPLORATION;  Surgeon: Jimmye Norman, MD;  Location: [facility] OR;  Service: General;  Laterality: Left;  . PROSTATE BIOPSY     ok       Family History  Problem Relation Age of Onset  . Diabetes Mother   . Hypertension Mother   . Stroke Father   . Hypertension Father   . Diabetes Maternal Grandmother   . Hypertension Maternal Grandfather   . Hypertension Paternal Grandfather     Social History   Tobacco Use  . Smoking status: Current Some Day Smoker    Packs/day:  0.25    Years: 10.00    Pack years: 2.50    Types: Cigarettes  . Smokeless tobacco: Never Used  Vaping Use  . Vaping Use: Never used  Substance Use Topics  . Alcohol use: No  . Drug use: No    Home Medications Prior to Admission medications   Medication Sig Start Date End Date Taking? Authorizing Provider  ondansetron (ZOFRAN ODT) 4 MG disintegrating tablet Take 1 tablet (4 mg total) by mouth every 8 (eight) hours as needed for nausea or vomiting. 07/01/19   Gailen Shelter, PA    Allergies    Ciprofloxacin  Review of Systems   Review of Systems  Psychiatric/Behavioral: Positive for sleep disturbance. The patient is nervous/anxious.   All other  systems reviewed and are negative.   Physical Exam Updated Vital Signs BP (!) 137/107 (BP Location: Left Arm)   Pulse (!) 121   Temp 98.6 F (37 C) (Oral)   Resp 20   Ht 6\' 2"  (1.88 m)   Wt 68 kg   SpO2 97%   BMI 19.25 kg/m   Physical Exam Vitals and nursing note reviewed.  Constitutional:      Appearance: He is well-developed.  HENT:     Head: Normocephalic and atraumatic.  Eyes:     Conjunctiva/sclera: Conjunctivae normal.  Cardiovascular:     Rate and Rhythm: Normal rate and regular rhythm.  Pulmonary:     Effort: Pulmonary effort is normal.     Breath sounds: Normal breath sounds.  Abdominal:     Palpations: Abdomen is soft.  Skin:    General: Skin is warm.  Neurological:     Mental Status: He is alert.  Psychiatric:        Behavior: Behavior normal.     ED Results / Procedures / Treatments   Labs (all labs ordered are listed, but only abnormal results are displayed) Labs Reviewed  COMPREHENSIVE METABOLIC PANEL - Abnormal; Notable for the following components:      Result Value   Glucose, Bld 114 (*)    Total Protein 8.8 (*)    AST 93 (*)    All other components within normal limits  RAPID URINE DRUG SCREEN, HOSP PERFORMED - Abnormal; Notable for the following components:   Cocaine POSITIVE (*)    All other components within normal limits  CBC WITH DIFFERENTIAL/PLATELET - Abnormal; Notable for the following components:   Neutro Abs 8.3 (*)    All other components within normal limits  SARS CORONAVIRUS 2 BY RT PCR (HOSPITAL ORDER, PERFORMED IN [facility])  ETHANOL    EKG EKG Interpretation  Date/Time:  Friday November 16 2019 20:18:31 EDT Ventricular Rate:  105 PR Interval:    QRS Duration: 79 QT Interval:  324 QTC Calculation: 429 R Axis:   77 Text Interpretation: Sinus tachycardia Probable left atrial enlargement RSR' in V1 or V2, probably normal variant Left ventricular hypertrophy ST elev, probable normal early repol pattern  rate is faster compared to 2019 Confirmed by 2020 [id number]) on 11/16/2019 8:37:05 PM   Radiology No results found.  Procedures Procedures (including critical care time)  Medications Ordered in ED Medications - No data to display  ED Course  I have reviewed the triage vital signs and the nursing notes.  Pertinent labs & imaging results that were available during my care of the patient were reviewed by me and considered in my medical decision making (see chart for details).    MDM  Rules/Calculators/A&P                          Patient brought in under IVC caregiver about his wife for behavior disturbance, mood instability, and anxiety.  The symptoms have been worsened by the recent passing of his grandmother.  She reports he has mentioned concerning comments such as "I might as well be dead." He is calm and cooperative on exam. First exam completed. TTS consulted for evaluation. Medical screening labs are reassuring. Though UDS is positive for cocaine. Psych hold orders placed.   Final Clinical Impression(s) / ED Diagnoses Final diagnoses:  Anxiety    Rx / DC Orders ED Discharge Orders    None       Johntavius Shepard, [place], [place] 11/16/19 2141

## 2019-11-16 NOTE — BH Assessment (Signed)
Tele Assessment Note   Patient Name: Isaiah Heath MRN: [id number] Referring Physician: [place] Robinson, PA-C Location of Patient: [facility] ED, [phone] Location of Provider: Behavioral Health TTS Department  Isaiah Heath is an 39 y.o. married male who presents unaccompanied to [facility] ED via Patent examiner after being petitioned for involuntary commitment by his wife, Isaiah Heath [phone]. Affidavit and petition states: "Respondent has been under a lot of stress lately. Two days ago, his grandmother died of COVID and currently his mother is in the hospital for same. Petitioner states whenever respondent is under stress, he just cannot handle it and will have a short temper and become irate. He hasn't slept in days making his temper worse. In the past 24 hours, he stated that he might as well be dead. He told his wife during an argument, that they would both be in the grave today. He constantly stomps around, slams doors and yells like he is going to attack. Respondent and his wife recently went for counseling. During the intake, he became irate and the counselor notified them that she could not work with them. She then told the wife that "something" was going on with him and for her to be careful. Respondent is currently a danger to self and others."  Pt states he has been under stress for the past two weeks due to his grandmother dying of COVID and his mother currently being hospitalized with COVID. He states he has not been sleeping because of all the things running through his mind and because he has a cough. He says last night he was tearful and upset and looked to his wife for support but she appeared irritated. He says they argued and he reports saying, "Sometimes I don't want to be here", which he says his wife misunderstood as meaning that he didn't want to live. Pt adamantly denies any recent suicidal thoughts. He denies any history of suicide attempts. He denies any  history of intentional self-injurious behaviors. He denies making any threats to harm his wife or anyone else. He denies any history of violence. He denies any history of psychotic symptoms. He reports drinking one beer daily and infrequently using marijuana. He denies using other substances, however Pt's urine drug screen is positive for cocaine. Pt acknowledges he has been upset and has been withdrawn.  Pt states he lives with his wife and her two children, ages 62 and 7. He says he works for a [employer]. Pt states he has received outpatient counseling and medication management in the past for depression and anxiety. He says he has not taken psychiatric medication in approximately 1 1/2 years because his mood was better. He denies history of inpatient psychiatric treatment. He denies history of abuse or trauma. He denies legal problems. He denies access to firearms. He identifies his wife as his primary support.  TTS contacted petitioner/wife for collateral information. She confirms the information in the affidavit. She says he has a history of depression and anxiety and that his symptoms have worsened over the past two years. She says Pt cannot handle the stress of his current situation and says "he's overwhelmed." She says he has repeated made comments recently such as "I might as well be dead". She says he has made threats that they "would both be in the grave." She says he has not assaulted her in the past but they have pushed one another. She describes his mood as very labile and says he has  contacted her repeatedly from [facility], stating that he during one call that he understood her concern, then on other calls was angry and stating he wants a divorce.   Pt is casually dressed, alert and oriented x4. Pt speaks in a clear tone, at moderate volume and normal pace. Motor behavior appears normal. Eye contact is good. Pt's mood is pleasant and euthymic and affect is anxious. Thought process is coherent  and relevant. There is no indication Pt is currently responding to internal stimuli or experiencing delusional thought content. Pt was cooperative throughout assessment. 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   Diagnosis: F33.2 Major depressive disorder, Recurrent episode, Severe  Past Medical History:  Past Medical History:  Diagnosis Date  . Anxiety   . Diverticulitis   . Inguinal hernia, left     Past Surgical History:  Procedure Laterality Date  . HERNIA REPAIR Left [place], [place] [facility] surgery, Dr. Nelda Marseille  . INGUINAL HERNIA REPAIR Left 03/03/2016   Procedure: LEFT INGUNIAL HERNIA EXPLORATION;  Surgeon: Jimmye Norman, MD;  Location: [facility] OR;  Service: General;  Laterality: Left;  . PROSTATE BIOPSY     ok    Family History:  Family History  Problem Relation Age of Onset  . Diabetes Mother   . Hypertension Mother   . Stroke Father   . Hypertension Father   . Diabetes Maternal Grandmother   . Hypertension Maternal Grandfather   . Hypertension Paternal Grandfather     Social History:  reports that he has been smoking cigarettes. He has a 2.50 pack-year smoking history. He has never used smokeless tobacco. He reports that he does not drink alcohol and does not use drugs.  Additional Social History:  Alcohol / Drug Use Pain Medications: Denies abuse Prescriptions: Denies abuse Over the Counter: Denies abuse History of alcohol / drug use?: Yes (UDS positive for cocaine)  CIWA: CIWA-Ar BP: (!) 137/107 Pulse Rate: (!) 121 COWS:    Allergies:  Allergies  Allergen Reactions  . Ciprofloxacin Hives    Home Medications: (Not in a hospital admission)   OB/GYN Status:  No LMP for male patient.  General Assessment Data Location of Assessment: [facility] TTS Assessment: In system Is this a Tele or Face-to-Face Assessment?: Tele Assessment Is this an  Initial Assessment or a Re-assessment for this encounter?: Initial Assessment Patient Accompanied by:: N/A Language Other than English: No Living Arrangements: Other (Comment) (Lives with wife and two children) What gender do you identify as?: Male Date Telepsych consult ordered in [facility]: 11/16/19 Time Telepsych consult ordered in [facility]: 1943 Marital status: Married Artist name: NA Pregnancy Status: No Living Arrangements: Parent, Children Can pt return to current living arrangement?: Yes Admission Status: Involuntary Petitioner: Family member Is patient capable of signing voluntary admission?: Yes Referral Source: Self/Family/Friend Insurance type: BCBS     Crisis Care Plan Living Arrangements: Parent, Children Legal Guardian: Other: (Self) Name of Psychiatrist: None Name of Therapist: None  Education Status Is patient currently in school?: No Is the patient employed, unemployed or receiving disability?: Employed  Risk to self with the past 6 months Suicidal Ideation: Yes-Currently Present Has patient been a risk to self within the past 6 months prior to admission? : Yes Suicidal Intent: No Has patient had any suicidal intent within the past 6 months prior to admission? : No Is patient at risk for suicide?:  Yes Suicidal Plan?: No Has patient had any suicidal plan within the past 6 months prior to admission? : No Access to Means: No What has been your use of drugs/alcohol within the last 12 months?: Pt's UDS is positive for cocaine Previous Attempts/Gestures: No How many times?: 0 Other Self Harm Risks: None Triggers for Past Attempts: None known Intentional Self Injurious Behavior: None Family Suicide History: No Recent stressful life event(s): Loss (Comment), Other (Comment) (Grandmother died, mother in hospital) Persecutory voices/beliefs?: No Depression: Yes Depression Symptoms: Insomnia, Despondent, Isolating, Fatigue, Feeling angry/irritable Substance abuse history  and/or treatment for substance abuse?: No Suicide prevention information given to non-admitted patients: Not applicable  Risk to Others within the past 6 months Homicidal Ideation: No Does patient have any lifetime risk of violence toward others beyond the six months prior to admission? : No Thoughts of Harm to Others: No Current Homicidal Intent: No Current Homicidal Plan: No Access to Homicidal Means: No Identified Victim: None History of harm to others?: No Assessment of Violence: None Noted Violent Behavior Description: Pt denies history of violence Does patient have access to weapons?: No Criminal Charges Pending?: No Does patient have a court date: No Is patient on probation?: No  Psychosis Hallucinations: None noted Delusions: None noted        ADLScreening [facility] Assessment Services) Patient's cognitive ability adequate to safely complete daily activities?: Yes Patient able to express need for assistance with ADLs?: Yes Independently performs ADLs?: Yes (appropriate for developmental age)        ADL Screening (condition at time of admission) Patient's cognitive ability adequate to safely complete daily activities?: Yes Is the patient deaf or have difficulty hearing?: No Does the patient have difficulty seeing, even when wearing glasses/contacts?: No Does the patient have difficulty concentrating, remembering, or making decisions?: No Patient able to express need for assistance with ADLs?: Yes Does the patient have difficulty dressing or bathing?: No Independently performs ADLs?: Yes (appropriate for developmental age) Does the patient have difficulty walking or climbing stairs?: No Weakness of Legs: None Weakness of Arms/Hands: None  Home Assistive Devices/Equipment Home Assistive Devices/Equipment: None    Abuse/Neglect Assessment (Assessment to be complete while patient is alone) Abuse/Neglect Assessment Can Be Completed: Yes Physical Abuse:  Denies Verbal Abuse: Denies Sexual Abuse: Denies Exploitation of patient/patient's resources: Denies Self-Neglect: Denies     Merchant navy officer (For Healthcare) Does Patient Have a Medical Advance Directive?: No Would patient like information on creating a medical advance directive?: No - Patient declined          Disposition: Gave clinical report to Gillermo Murdoch, NP who said Pt meets criteria for inpatient psychiatric treatment. Binnie Rail, [facility], confirmed bed availability. Pt has been accepted to [facility] under Dr. Jola Babinski, room 303-1, pending resulted COVID test. [facility] at [facility] is requesting another set of vital signs. Notified Dr. Pricilla Loveless, Isaiah Robinson, PA-C and Beverly Gust, RN of acceptance.     This service was provided via telemedicine using a 2-way, interactive audio and video technology.  Names of all persons participating in this telemedicine service and their role in this encounter. Name: Shiela Mayer Role: Patient  Name: [facility] Role: Petitioner, Pt's wife  Name: Shela Commons, [facility] Role: TTS counselor       Harlin Rain Patsy [place], [facility], [facility] Triage Specialist [phone]  Pamalee Leyden 11/16/2019 10:05 PM

## 2019-11-16 NOTE — ED Triage Notes (Signed)
IVC by wife, patient stated that he is depressed because his family members are hospitalized with COVID and one recently passed. Patient denies SI/HI at this time. Calm and cooperative.

## 2019-11-16 NOTE — ED Notes (Signed)
Patient noted to have eloped. Off duty Technical sales engineer, MD, PA, and Sheriff's department notified.

## 2019-11-16 NOTE — BH Assessment (Addendum)
Received message from Beverly Gust, RN that Pt eloped from [employer]. Shortly after that message I received a call from Pt's wife/petitioner, Raquel James, stating that Pt called her and said he was released. Explained that Pt is still under involuntary commitment and asked that she please call [facility] if she determined his location so law enforcement can return Pt to [facility].   Pamalee Leyden, [facility], [facility] Triage Specialist [phone]

## 2019-11-16 NOTE — ED Notes (Signed)
Sheriff's dept was contacted at this time by this Clinical research associate and notified that pt has returned home and needs to be brought back to the ED.

## 2019-11-17 ENCOUNTER — Encounter (HOSPITAL_COMMUNITY): Payer: Self-pay

## 2019-11-17 ENCOUNTER — Inpatient Hospital Stay (HOSPITAL_COMMUNITY)
Admission: AD | Admit: 2019-11-17 | Discharge: 2019-11-19 | DRG: 885 | Disposition: A | Discharge status: Home / Self Care | Payer: BLUE CROSS/BLUE SHIELD | Attending: Psychiatry | Admitting: Psychiatry

## 2019-11-17 ENCOUNTER — Other Ambulatory Visit: Payer: Self-pay

## 2019-11-17 DIAGNOSIS — F339 Major depressive disorder, recurrent, unspecified: Secondary | ICD-10-CM | POA: Diagnosis present

## 2019-11-17 DIAGNOSIS — F17210 Nicotine dependence, cigarettes, uncomplicated: Secondary | ICD-10-CM | POA: Diagnosis present

## 2019-11-17 DIAGNOSIS — F411 Generalized anxiety disorder: Secondary | ICD-10-CM | POA: Diagnosis present

## 2019-11-17 DIAGNOSIS — G4700 Insomnia, unspecified: Secondary | ICD-10-CM | POA: Diagnosis present

## 2019-11-17 DIAGNOSIS — F1420 Cocaine dependence, uncomplicated: Secondary | ICD-10-CM

## 2019-11-17 DIAGNOSIS — Z20822 Contact with and (suspected) exposure to covid-19: Secondary | ICD-10-CM | POA: Diagnosis present

## 2019-11-17 DIAGNOSIS — F332 Major depressive disorder, recurrent severe without psychotic features: Secondary | ICD-10-CM | POA: Diagnosis not present

## 2019-11-17 DIAGNOSIS — I10 Essential (primary) hypertension: Secondary | ICD-10-CM | POA: Diagnosis present

## 2019-11-17 DIAGNOSIS — F419 Anxiety disorder, unspecified: Secondary | ICD-10-CM | POA: Diagnosis present

## 2019-11-17 MED ORDER — TRAZODONE HCL 50 MG PO TABS
50.0000 mg | ORAL_TABLET | Freq: Every evening | ORAL | Status: DC | PRN
  Administered 2019-11-17 – 2019-11-18 (×2): 50 mg via ORAL
  Filled 2019-11-17 (×2): qty 1

## 2019-11-17 MED ORDER — LISINOPRIL 5 MG PO TABS
5.0000 mg | ORAL_TABLET | Freq: Every day | ORAL | Status: DC
  Administered 2019-11-17 – 2019-11-19 (×3): 5 mg via ORAL
  Filled 2019-11-17 (×5): qty 1

## 2019-11-17 MED ORDER — LORAZEPAM 1 MG PO TABS
1.0000 mg | ORAL_TABLET | Freq: Four times a day (QID) | ORAL | Status: DC | PRN
  Administered 2019-11-17: 1 mg via ORAL
  Filled 2019-11-17: qty 1

## 2019-11-17 MED ORDER — LORAZEPAM 0.5 MG PO TABS
0.5000 mg | ORAL_TABLET | Freq: Once | ORAL | Status: AC
  Administered 2019-11-17: 0.5 mg via ORAL
  Filled 2019-11-17: qty 1

## 2019-11-17 MED ORDER — MAGNESIUM HYDROXIDE 400 MG/5ML PO SUSP: 30.0000 mL | Freq: Every day | ORAL | Status: DC | PRN

## 2019-11-17 MED ORDER — SERTRALINE HCL 25 MG PO TABS
25.0000 mg | ORAL_TABLET | Freq: Every day | ORAL | Status: DC
  Administered 2019-11-17 – 2019-11-19 (×3): 25 mg via ORAL
  Filled 2019-11-17 (×5): qty 1

## 2019-11-17 MED ORDER — ACETAMINOPHEN 325 MG PO TABS: 650.0000 mg | ORAL_TABLET | Freq: Four times a day (QID) | ORAL | Status: DC | PRN

## 2019-11-17 MED ORDER — HYDROXYZINE HCL 25 MG PO TABS
25.0000 mg | ORAL_TABLET | Freq: Three times a day (TID) | ORAL | Status: DC | PRN
  Administered 2019-11-18 – 2019-11-19 (×2): 25 mg via ORAL
  Filled 2019-11-17 (×2): qty 1

## 2019-11-17 MED ORDER — ALUM & MAG HYDROXIDE-SIMETH 200-200-20 MG/5ML PO SUSP: 30.0000 mL | ORAL | Status: DC | PRN

## 2019-11-17 NOTE — [facility] Suicide Risk Assessment (Signed)
[facility] Admission Suicide Risk Assessment   Nursing information obtained from:  Patient Demographic factors:  Male Current Mental Status:  NA Loss Factors:  Loss of significant relationship Historical Factors:  NA Risk Reduction Factors:  Sense of responsibility to family, Positive social support, Positive coping skills or problem solving skills, Positive therapeutic relationship  Total Time spent with patient: 30 minutes Principal Problem: <principal problem not specified> Diagnosis:  Active Problems:   Anxiety   MDD (major depressive disorder), recurrent episode, severe (HCC)   Major depressive disorder, recurrent episode (HCC)  Subjective Data: Patient is seen and examined.  Patient is a 39 year old male with a reported past psychiatric history significant for anxiety and depression who presented to the [facility] emergency department under involuntary commitment on 11/16/2019.  His wife involuntarily committed him.  According to the paperwork the patient had been under a lot of stress lately.  His grandmother had recently died from Covid related issues, and his mother was apparently in the hospital in [place] for similar circumstances.  He stated that he had walked out of the bathroom yesterday, and apparently his eyes were watering from some shape or form.  There was some unspecified disagreement between he and his wife, and that she decided that he needed to be involuntarily committed.  The paperwork stated that he had not slept in days, and that his anger issues had worsened.  He apparently had been stopping around, slamming doors, and yelled like he was going to attack.  There is also information that the patient and his wife had recently started counseling.  Collateral information from staff stated that the relationship issues between the wife and the patient apparently were not good and the fact that the wife was going to "poke holes in for tires".  The patient stated that  he had previously been on Zoloft for similar circumstances a year and a half ago.  He stated he felt as though that was helpful.  He denied any drug use, but his drug screen was positive for cocaine.  He denied any use of cocaine.  Review of the electronic medical record showed no evidence of previous psychiatric treatment, previous psychiatric evaluations for substance related issues.  When we discussed the fact that use of cocaine would lead to behavior similar to what his wife was reporting he again denied any cocaine use.  He denied any current racing thoughts or pressured speech.  He denied any previous manic symptoms prior to last night.  He denied any auditory or visual hallucinations.  He denied any suicidal or homicidal ideation.  He was admitted to the hospital for evaluation and stabilization.  Continued Clinical Symptoms:  Alcohol Use Disorder Identification Test Final Score (AUDIT): 5 The "Alcohol Use Disorders Identification Test", Guidelines for Use in Primary Care, Second Edition.  World Science writer [facility]). Score between 0-7:  no or low risk or alcohol related problems. Score between 8-15:  moderate risk of alcohol related problems. Score between 16-19:  high risk of alcohol related problems. Score 20 or above:  warrants further diagnostic evaluation for alcohol dependence and treatment.   CLINICAL FACTORS:   Depression:   Anhedonia Hopelessness Impulsivity Insomnia   Musculoskeletal: Strength & Muscle Tone: within normal limits Gait & Station: normal Patient leans: N/A  Psychiatric Specialty Exam: Physical Exam Vitals and nursing note reviewed.  HENT:     Head: Normocephalic.  Pulmonary:     Effort: Pulmonary effort is normal.  Neurological:  General: No focal deficit present.     Mental Status: He is alert and oriented to person, place, and time.     Review of Systems  Blood pressure (!) 154/104, pulse 90, temperature 99.4 F (37.4 C), temperature  source Oral, resp. rate 16, height 6' 1.5" (1.867 m), weight 64.4 kg, SpO2 100 %.Body mass index is 18.48 kg/m.  General Appearance: Casual  Eye Contact:  Good  Speech:  Normal Rate  Volume:  Normal  Mood:  Euthymic  Affect:  Congruent  Thought Process:  Coherent and Descriptions of Associations: Circumstantial  Orientation:  Full (Time, Place, and Person)  Thought Content:  Logical  Suicidal Thoughts:  No  Homicidal Thoughts:  No  Memory:  Immediate;   Fair Recent;   Fair Remote;   Fair  Judgement:  Intact  Insight:  Fair  Psychomotor Activity:  Normal  Concentration:  Concentration: Good and Attention Span: Good  Recall:  Good  Fund of Knowledge:  Good  Language:  Good  Akathisia:  Negative  Handed:  Right  AIMS (if indicated):     Assets:  Desire for Improvement Housing Resilience Social Support  ADL's:  Intact  Cognition:  WNL  Sleep:  Number of Hours: 0.5      COGNITIVE FEATURES THAT CONTRIBUTE TO RISK:  None    SUICIDE RISK:   Mild:  Suicidal ideation of limited frequency, intensity, duration, and specificity.  There are no identifiable plans, no associated intent, mild dysphoria and related symptoms, good self-control (both objective and subjective assessment), few other risk factors, and identifiable protective factors, including available and accessible social support.  PLAN OF CARE: Patient is seen and examined.  Patient is a 39 year old male with the above-stated past psychiatric history who was admitted secondary to suicidal ideation her involuntary commitment paperwork.  He will be admitted to the hospital.  He will be integrated in the milieu.  He will be encouraged to attend groups.  His drug screen was positive for cocaine, but he vehemently denies this.  He denies suicidal ideation.  He stated he was just frustrated with the illnesses that his mother and grandmother were affected by.  He is already asking for discharge.  He stated that previously when  things like this it happened in the past he was placed on Zoloft.  He stated it was very effective.  We will restart Zoloft today 25 mg p.o. daily and titrate that.  Additionally his blood pressure has been elevated, and he has no history of previous blood pressure problems.  We will start him on lisinopril 10 mg p.o. daily and titrate that during the course of hospitalization.  He will also have available hydroxyzine for anxiety and trazodone for sleep.  He did not sleep well last night, but he stated that it was because his roommate was "snoring".  We will need to contact his wife for collateral information.  I think we have to just monitor things until we get verifiable information regarding the case.  Review of his admission laboratories showed normal electrolytes except for an elevated AST at 93.  The last time his liver function enzymes were tested was 4 months ago and they were normal.  CBC was normal.  Blood alcohol was less than 10.  Drug screen was positive for cocaine.  His EKG showed a sinus rhythm but was abnormal.  His QTc interval was normal.  We will repeat the EKG this AM.  His blood pressure this morning is 154/104.  Pulse was 90.  I certify that inpatient services furnished can reasonably be expected to improve the patient's condition.   Antonieta Pert, MD 11/17/2019, 10:34 AM

## 2019-11-17 NOTE — Progress Notes (Signed)
D: Patient remained in bed majority of morning. His BP was elevated and medication was ordered. Patient was asked to come up to medication window this morning. He got up after lunch and asked about his medication. His wife called re his admission and wanted to know why she had not heard from any staff here. Explained to her the process once a patient is admitted. Patient is pleasant with staff. He was especially happy once he learned his mother was being discharged tomorrow from the hospital. He denies any SI/HI/AVH.  A: Continue to monitor medication management and MD orders.  Safety checks completed every 15 minutes per protocol.  Offer support and encouragement as needed.  R: Patient is receptive to staff; he remains isolative.

## 2019-11-17 NOTE — Tx Team (Signed)
Initial Treatment Plan 11/17/2019 5:39 AM Lucia L Bowersox [id number]    PATIENT STRESSORS: Loss of grandmother Substance abuse   PATIENT STRENGTHS: Capable of independent living Wellsite geologist fund of knowledge   PATIENT IDENTIFIED PROBLEMS: Major Depressive Disorder  Substance Abuse  "worried about mother, grieving grandmother"                 DISCHARGE CRITERIA:  Improved stabilization in mood, thinking, and/or behavior  PRELIMINARY DISCHARGE PLAN: Outpatient therapy  PATIENT/FAMILY INVOLVEMENT: This treatment plan has been presented to and reviewed with the patient, BERNHARDT RIEMENSCHNEIDER, and/or family member, .  The patient and family have been given the opportunity to ask questions and make suggestions.  Shelia Media, RN 11/17/2019, 5:39 AM

## 2019-11-17 NOTE — [facility] Group Notes (Signed)
[facility] Group Notes: (Clinical Social Work)   11/17/2019      Type of Therapy:  Group Therapy   Participation Level:  Did Not Attend despite MHT prompting   Janah Mcculloh N Kassadi Presswood, LCSW  11/17/2019 12:57 PM   

## 2019-11-17 NOTE — [facility] Group Notes (Signed)
Adult Psychoeducational Group Note  Date:  11/17/2019 Time:  12:41 PM  Group Topic/Focus:  Goals Group:   The focus of this group is to help patients establish daily goals to achieve during treatment and discuss how the patient can incorporate goal setting into their daily lives to aide in recovery.  Participation Level:  Did Not Attend   Dione Housekeeper 11/17/2019, 12:41 PM

## 2019-11-17 NOTE — Progress Notes (Signed)
[facility] Group Notes:  (Nursing/MHT/Case Management/Adjunct)  Date:  11/17/2019  Time:  2030  Type of Therapy:  wrap up group  Participation Level:  Active  Participation Quality:  Attentive and Sharing  Affect:  Anxious and Depressed  Cognitive:  Appropriate  Insight:  Improving  Engagement in Group:  Engaged  Modes of Intervention:  Clarification, Education and Support  Summary of Progress/Problems: Positive thinking and positive change were discussed.   Marcille Buffy 11/17/2019, 9:23 PM

## 2019-11-17 NOTE — H&P (Signed)
Psychiatric Admission Assessment Adult  Patient Identification: Isaiah Heath  MRN:  [id number]  Date of Evaluation:  11/17/2019  Chief Complaint: Worsening stress/anxiety triggered by sickness/death in the family.  Principal Diagnosis: MDD (major depressive disorder), recurrent episode, severe (HCC)  Diagnosis:  Principal Problem:   MDD (major depressive disorder), recurrent episode, severe (HCC) Active Problems:   Cocaine use disorder, moderate, dependence (HCC)   Anxiety   Major depressive disorder, recurrent episode (HCC)  History of Present Illness: (Per Md's admission SRA notes): Patient is a 39 year old male with a reported past psychiatric history significant for anxiety and depression who presented to the [facility] emergency department under involuntary commitment on 11/16/2019.  His wife involuntarily committed him.  According to the paperwork the patient had been under a lot of stress lately.  His grandmother had recently died from Covid related issues, and his mother was apparently in the hospital in [place] for similar circumstances.  He stated that he had walked out of the bathroom yesterday, and apparently his eyes were watering from some shape or form.  There was some unspecified disagreement between he and his wife, and that she decided that he needed to be involuntarily committed.  The paperwork stated that he had not slept in days, and that his anger issues had worsened.  He apparently had been stopping around, slamming doors, and yelled like he was going to attack.  There is also information that the patient and his wife had recently started counseling.  Collateral information from staff stated that the relationship issues between the wife and the patient apparently were not good and the fact that the wife was going to "poke holes in for tires".  The patient stated that he had previously been on Zoloft for similar circumstances a year and a half ago.  He  stated he felt as though that was helpful.  He denied any drug use, but his drug screen was positive for cocaine.  He denied any use of cocaine.  Review of the electronic medical record showed no evidence of previous psychiatric treatment, previous psychiatric evaluations for substance related issues.  When we discussed the fact that use of cocaine would lead to behavior similar to what his wife was reporting he again denied any cocaine use.  He denied any current racing thoughts or pressured speech.  He denied any previous manic symptoms prior to last night.  He denied any auditory or visual hallucinations.  He denied any suicidal or homicidal ideation.  He was admitted to the hospital for evaluation and stabilization.  Associated Signs/Symptoms:  Depression Symptoms:  depressed mood, insomnia, psychomotor agitation, anxiety,  (Hypo) Manic Symptoms:  Irritable Mood,  Anxiety Symptoms:  Excessive Worry,  Psychotic Symptoms:  Denies any hallucunations, delusions or paranoia.  PTSD Symptoms: NA  Total Time spent with patient: 1 hour  Past Psychiatric History: Generalized anxiety disorder, Cocaine use disorder, Major depressive disorder.  Is the patient at risk to self? No.  Has the patient been a risk to self in the past 6 months? Yes.    Has the patient been a risk to self within the distant past? No.  Is the patient a risk to others? No.  Has the patient been a risk to others in the past 6 months? No.  Has the patient been a risk to others within the distant past? No.   Prior Inpatient Therapy: Denies Prior Outpatient Therapy: "Yes , my primary care physician.  Alcohol Screening: 1. How often  do you have a drink containing alcohol?: 2 to 3 times a week 2. How many drinks containing alcohol do you have on a typical day when you are drinking?: 3 or 4 3. How often do you have six or more drinks on one occasion?: Less than monthly AUDIT-C Score: 5 4. How often during the last year  have you found that you were not able to stop drinking once you had started?: Never 5. How often during the last year have you failed to do what was normally expected from you because of drinking?: Never 6. How often during the last year have you needed a first drink in the morning to get yourself going after a heavy drinking session?: Never 7. How often during the last year have you had a feeling of guilt of remorse after drinking?: Never 8. How often during the last year have you been unable to remember what happened the night before because you had been drinking?: Never 9. Have you or someone else been injured as a result of your drinking?: No 10. Has a relative or friend or a doctor or another health worker been concerned about your drinking or suggested you cut down?: No Alcohol Use Disorder Identification Test Final Score (AUDIT): 5 Alcohol Brief Interventions/Follow-up: AUDIT Score <7 follow-up not indicated  Substance Abuse History in the last 12 months:  Yes.    Consequences of Substance Abuse: Medical Consequences:  Liver damage, Possible death by overdose Legal Consequences:  Arrests, jail time, Loss of driving privilege. Family Consequences:  Family discord, divorce and or separation.  Previous Psychotropic Medications: "Yes, Sertraline"  Psychological Evaluations: No   Past Medical History:  Past Medical History:  Diagnosis Date  . Anxiety   . Diverticulitis   . Inguinal hernia, left     Past Surgical History:  Procedure Laterality Date  . HERNIA REPAIR Left [place], Dr. Nelda Marseille  . INGUINAL HERNIA REPAIR Left 03/03/2016   Procedure: LEFT INGUNIAL HERNIA EXPLORATION;  Surgeon: Jimmye Norman, MD;  Location: [facility] OR;  Service: General;  Laterality: Left;  . PROSTATE BIOPSY     ok   Family History:  Family History  Problem Relation Age of Onset  . Diabetes Mother   . Hypertension Mother   . Stroke Father   . Hypertension Father   .  Diabetes Maternal Grandmother   . Hypertension Maternal Grandfather   . Hypertension Paternal Grandfather    Family Psychiatric  History: Denies any hx of mental health issues in his family.  Tobacco Screening: Have you used any form of tobacco in the last 30 days? (Cigarettes, Smokeless Tobacco, Cigars, and/or Pipes): Yes Tobacco use, Select all that apply: 5 or more cigarettes per day Are you interested in Tobacco Cessation Medications?: No, patient refused Counseled patient on smoking cessation including recognizing danger situations, developing coping skills and basic information about quitting provided: Refused/Declined practical counseling  Social History: Married, lives in [place], has 4 children, employed. Social History   Substance and Sexual Activity  Alcohol Use Yes  . Alcohol/week: 3.0 standard drinks  . Types: 3 Cans of beer per week     Social History   Substance and Sexual Activity  Drug Use Yes  . Types: Cocaine, Marijuana    Additional Social History:  Allergies:   Allergies  Allergen Reactions  . Ciprofloxacin Hives   Lab Results:  Results for orders placed or performed during the hospital encounter of 11/16/19 (from the past 48 hour(s))  SARS Coronavirus 2 by RT PCR (hospital order, performed in [facility] hospital lab) Nasopharyngeal Nasopharyngeal Swab     Status: None   Collection Time: 11/16/19  7:14 PM   Specimen: Nasopharyngeal Swab  Result Value Ref Range   SARS Coronavirus 2 NEGATIVE NEGATIVE    Comment: (NOTE) SARS-CoV-2 target nucleic acids are NOT DETECTED.  The SARS-CoV-2 RNA is generally detectable in upper and lower respiratory specimens during the acute phase of infection. The lowest concentration of SARS-CoV-2 viral copies this assay can detect is 250 copies / mL. A negative result does not preclude SARS-CoV-2 infection and should not be used as the sole basis for treatment or other patient management decisions.  A negative result  may occur with improper specimen collection / handling, submission of specimen other than nasopharyngeal swab, presence of viral mutation(s) within the areas targeted by this assay, and inadequate number of viral copies (<250 copies / mL). A negative result must be combined with clinical observations, patient history, and epidemiological information.  Fact Sheet for Patients:   BoilerBrush.com.cy  Fact Sheet for Healthcare Providers: https://pope.com/  This test is not yet approved or  cleared by the [place] FDA and has been authorized for detection and/or diagnosis of SARS-CoV-2 by FDA under an Emergency Use Authorization (EUA).  This EUA will remain in effect (meaning this test can be used) for the duration of the COVID-19 declaration under Section 564(b)(1) of the Act, 21 U.S.C. section 360bbb-3(b)(1), unless the authorization is terminated or revoked sooner.  Performed at [facility], [address], [place], [place] [address]   Comprehensive metabolic panel     Status: Abnormal   Collection Time: 11/16/19  8:20 PM  Result Value Ref Range   Sodium 140 135 - 145 mmol/L   Potassium 3.9 3.5 - 5.1 mmol/L   Chloride 103 98 - 111 mmol/L   CO2 28 22 - 32 mmol/L   Glucose, Bld 114 (H) 70 - 99 mg/dL    Comment: Glucose reference range applies only to samples taken after fasting for at least 8 hours.   BUN 15 6 - 20 mg/dL   Creatinine, Ser 8.56 0.61 - 1.24 mg/dL   Calcium 9.6 8.9 - 31.4 mg/dL   Total Protein 8.8 (H) 6.5 - 8.1 g/dL   Albumin 4.9 3.5 - 5.0 g/dL   AST 93 (H) 15 - 41 U/L   ALT 44 0 - 44 U/L   Alkaline Phosphatase 82 38 - 126 U/L   Total Bilirubin 0.6 0.3 - 1.2 mg/dL   GFR calc non Af Amer >60 >60 mL/min   GFR calc Af Amer >60 >60 mL/min   Anion gap 9 5 - 15    Comment: Performed at [facility], [address], [place], [place] [address]  Ethanol     Status: None    Collection Time: 11/16/19  8:20 PM  Result Value Ref Range   Alcohol, Ethyl (B) <10 <10 mg/dL    Comment: (NOTE) Lowest detectable limit for serum alcohol is 10 mg/dL.  For medical purposes only. Performed at [facility], [address], [place], [place] [address]   Urine rapid drug screen (hosp performed)     Status: Abnormal   Collection Time: 11/16/19  8:20 PM  Result Value Ref Range   Opiates NONE DETECTED NONE DETECTED   Cocaine POSITIVE (A) NONE DETECTED   Benzodiazepines NONE DETECTED NONE DETECTED   Amphetamines NONE DETECTED NONE DETECTED  Tetrahydrocannabinol NONE DETECTED NONE DETECTED   Barbiturates NONE DETECTED NONE DETECTED    Comment: (NOTE) DRUG SCREEN FOR MEDICAL PURPOSES ONLY.  IF CONFIRMATION IS NEEDED FOR ANY PURPOSE, NOTIFY LAB WITHIN 5 DAYS.  LOWEST DETECTABLE LIMITS FOR URINE DRUG SCREEN Drug Class                     Cutoff (ng/mL) Amphetamine and metabolites    1000 Barbiturate and metabolites    200 Benzodiazepine                 200 Tricyclics and metabolites     300 Opiates and metabolites        300 Cocaine and metabolites        300 THC                            50 Performed at [facility], [address] [place], [place] [address]   CBC with Diff     Status: Abnormal   Collection Time: 11/16/19  8:20 PM  Result Value Ref Range   WBC 10.2 4.0 - 10.5 K/uL   RBC 5.01 4.22 - 5.81 MIL/uL   Hemoglobin 14.8 13.0 - 17.0 g/dL   HCT 41.2 39 - 52 %   MCV 92.4 80.0 - 100.0 fL   MCH 29.5 26.0 - 34.0 pg   MCHC 32.0 30.0 - 36.0 g/dL   RDW 87.8 67.6 - 72.0 %   Platelets 270 150 - 400 K/uL   nRBC 0.0 0.0 - 0.2 %   Neutrophils Relative % 81 %   Neutro Abs 8.3 (H) 1.7 - 7.7 K/uL   Lymphocytes Relative 12 %   Lymphs Abs 1.2 0.7 - 4.0 K/uL   Monocytes Relative 6 %   Monocytes Absolute 0.6 0 - 1 K/uL   Eosinophils Relative 0 %   Eosinophils Absolute 0.0 0 - 0 K/uL   Basophils Relative 1 %   Basophils  Absolute 0.1 0 - 0 K/uL   Immature Granulocytes 0 %   Abs Immature Granulocytes 0.03 0.00 - 0.07 K/uL    Comment: Performed at [facility], [address], [place], [place] [address]   Blood Alcohol level:  Lab Results  Component Value Date   ETH <10 11/16/2019   Metabolic Disorder Labs:  Lab Results  Component Value Date   HGBA1C 5.2 11/28/2018   No results found for: PROLACTIN Lab Results  Component Value Date   CHOL 134 11/28/2018   TRIG 40 11/28/2018   HDL 72 11/28/2018   CHOLHDL 1.9 11/28/2018   LDLCALC 52 11/28/2018   LDLCALC 45 01/03/2017   Current Medications: Current Facility-Administered Medications  Medication Dose Route Frequency Provider Last Rate Last Admin  . acetaminophen (TYLENOL) tablet 650 mg  650 mg Oral Q6H PRN Armandina Stammer I, NP      . alum & mag hydroxide-simeth (MAALOX/MYLANTA) 200-200-20 MG/5ML suspension 30 mL  30 mL Oral Q4H PRN Gillermo Murdoch, NP      . hydrOXYzine (ATARAX/VISTARIL) tablet 25 mg  25 mg Oral TID PRN Antonieta Pert, MD      . lisinopril (ZESTRIL) tablet 5 mg  5 mg Oral Daily Antonieta Pert, MD      . LORazepam (ATIVAN) tablet 1 mg  1 mg Oral Q6H PRN Antonieta Pert, MD      . sertraline (ZOLOFT) tablet 25 mg  25 mg Oral  Daily Antonieta Pert, MD      . traZODone (DESYREL) tablet 50 mg  50 mg Oral QHS PRN Antonieta Pert, MD       PTA Medications: No medications prior to admission.   Musculoskeletal: Strength & Muscle Tone: within normal limits Gait & Station: normal Patient leans: N/A  Psychiatric Specialty Exam: Physical Exam Vitals and nursing note reviewed.  HENT:     Head: Normocephalic.     Nose: Nose normal.     Mouth/Throat:     Pharynx: Oropharynx is clear.  Eyes:     Pupils: Pupils are equal, round, and reactive to light.  Cardiovascular:     Rate and Rhythm: Normal rate.     Pulses: Normal pulses.  Pulmonary:     Effort: Pulmonary effort is normal.   Genitourinary:    Comments: Deferred Musculoskeletal:        General: Normal range of motion.     Cervical back: Normal range of motion.  Skin:    General: Skin is warm and dry.  Neurological:     Mental Status: He is alert and oriented to person, place, and time.     Review of Systems  Constitutional: Negative for chills, diaphoresis and fever.  HENT: Negative for congestion, rhinorrhea, sneezing and sore throat.   Eyes: Negative for discharge.  Respiratory: Negative for cough, chest tightness, shortness of breath and wheezing.   Cardiovascular: Negative for chest pain and palpitations.  Gastrointestinal: Negative for diarrhea, nausea and vomiting.  Endocrine: Negative for cold intolerance.  Genitourinary: Negative for difficulty urinating.  Musculoskeletal: Negative for arthralgias.  Skin: Negative.   Allergic/Immunologic: Negative for environmental allergies and food allergies.       Allergies: Ciprofloxin  Neurological: Negative for dizziness, tremors, seizures, syncope, light-headedness and headaches.  Psychiatric/Behavioral: Positive for agitation, behavioral problems, decreased concentration, dysphoric mood, sleep disturbance and suicidal ideas. Negative for confusion, hallucinations and self-injury. The patient is nervous/anxious. The patient is not hyperactive.     Blood pressure (!) 154/104, pulse 90, temperature 99.4 F (37.4 C), temperature source Oral, resp. rate 16, height 6' 1.5" (1.867 m), weight 64.4 kg, SpO2 100 %.Body mass index is 18.48 kg/m.  General Appearance: Casual  Eye Contact:  Good  Speech:  Normal Rate  Volume:  Normal  Mood:  Euthymic  Affect:  Congruent  Thought Process:  Coherent and Descriptions of Associations: Circumstantial  Orientation:  Full (Time, Place, and Person)  Thought Content:  Logical  Suicidal Thoughts:  No  Homicidal Thoughts:  No  Memory:  Immediate;   Fair Recent;   Fair Remote;   Fair  Judgement:  Intact  Insight:   Fair  Psychomotor Activity:  Normal  Concentration:  Concentration: Good and Attention Span: Good  Recall:  Good  Fund of Knowledge:  Good  Language:  Good  Akathisia:  Negative  Handed:  Right  AIMS (if indicated):     Assets:  Desire for Improvement Housing Resilience Social Support  ADL's:  Intact  Cognition:  WNL    Sleep:  Number of Hours: 0.5   Treatment Plan Summary: Daily contact with patient to assess and evaluate symptoms and progress in treatment and Medication management.  Treatment Plan/Recommendations: 1. Admit for crisis management and stabilization, estimated length of stay 3-5 days.  2. Medication management to reduce current symptoms to base line and improve the patient's overall level of functioning: See [facility] for plan of care. 3. Treat health problems as indicated.  4. Develop  treatment plan to decrease risk of relapse upon discharge and the need for readmission.  5. Psycho-social education regarding relapse prevention and self care.  6. Health care follow up as needed for medical problems.  7. Review, reconcile, and reinstate any pertinent home medications for other health issues where appropriate. 8. Call for consults with hospitalist for any additional specialty patient care services as needed.  Observation Level/Precautions:  15 minute checks  Laboratory:  Per ED, UDS (+) for Cocaine  Psychotherapy: Group sessions  Medications: See MAR   Consultations: As needed  Discharge Concerns: Safety, mood stability, maintaining sobriety   Estimated LOS: 2-4 days  Other: Admit to the 300-hall    Physician Treatment Plan for Primary Diagnosis: MDD (major depressive disorder), recurrent episode, severe (HCC)  Long Term Goal(s): Improvement in symptoms so as ready for discharge  Short Term Goals: Ability to identify changes in lifestyle to reduce recurrence of condition will improve, Ability to verbalize feelings will improve and Ability to disclose and discuss  suicidal ideas  Physician Treatment Plan for Secondary Diagnosis: Principal Problem:   MDD (major depressive disorder), recurrent episode, severe (HCC) Active Problems:   Cocaine use disorder, moderate, dependence (HCC)   Anxiety   Major depressive disorder, recurrent episode (HCC)  Long Term Goal(s): Improvement in symptoms so as ready for discharge  Short Term Goals: Ability to demonstrate self-control will improve, Ability to identify and develop effective coping behaviors will improve, Compliance with prescribed medications will improve and Ability to identify triggers associated with substance abuse/mental health issues will improve  I certify that inpatient services furnished can reasonably be expected to improve the patient's condition.    Armandina Stammer, NP, PMHNP, FNP-BC 8/28/202112:28 PM

## 2019-11-17 NOTE — Progress Notes (Signed)
   11/17/19 1025  COVID-19 Daily Checkoff  Have you had a fever (temp > 37.80C/100F)  in the past 24 hours?  No  If you have had runny nose, nasal congestion, sneezing in the past 24 hours, has it worsened? No  COVID-19 EXPOSURE  Have you traveled outside the state in the past 14 days? No  Have you been in contact with someone with a confirmed diagnosis of COVID-19 or PUI in the past 14 days without wearing appropriate PPE? No  Have you been living in the same home as a person with confirmed diagnosis of COVID-19 or a PUI (household contact)? No  Have you been diagnosed with COVID-19? No

## 2019-11-17 NOTE — Progress Notes (Signed)
Patient is a 39 year old AA male IVCd by wife admitted on 8/18. Wife reports patient is not acting right, is acting out of it. Patient reports is grieving death of grandmother which died  2days ago of Covid. Patient reports his mom is now in the hospital with covid and having difficulty breathing. Patient reports he is not suicidal just worried about his mom. Denies any SI, HI, AVH. Pt does report having a decreased appetite. Reports has no psychiatric history. Patient reports using Marijuana, uds +cocaine. Drinks beer regularly. Admission assessment completed. Skin and contraband search completed and witnessed by Lillia Abed, MHT. No skin issues noted, no contraband found. Fluid and nutrition offered and accepted.  Encouragement and support provided, safety checks maintained. Pt remains safe on unit with q 15 min checks.

## 2019-11-17 NOTE — ED Notes (Signed)
Patient back in department with [facility]

## 2019-11-18 LAB — NOVEL CORONAVIRUS, NAA: SARS-CoV-2, NAA: NOT DETECTED

## 2019-11-18 LAB — SARS-COV-2, NAA 2 DAY TAT

## 2019-11-18 NOTE — [facility] Group Notes (Signed)
[facility] Group Notes: (Clinical Social Work)   11/18/2019      Type of Therapy:  Group Therapy   Participation Level:  Did Not Attend despite MHT prompting   Shellia Cleverly, LCSW  11/18/2019 12:38 PM

## 2019-11-18 NOTE — Progress Notes (Signed)
   11/18/19 2157  COVID-19 Daily Checkoff  Have you had a fever (temp > 37.80C/100F)  in the past 24 hours?  No  If you have had runny nose, nasal congestion, sneezing in the past 24 hours, has it worsened? No  COVID-19 EXPOSURE  Have you traveled outside the state in the past 14 days? No  Have you been in contact with someone with a confirmed diagnosis of COVID-19 or PUI in the past 14 days without wearing appropriate PPE? No  Have you been living in the same home as a person with confirmed diagnosis of COVID-19 or a PUI (household contact)? No  Have you been diagnosed with COVID-19? No

## 2019-11-18 NOTE — Progress Notes (Signed)
   11/18/19 2203  Psych Admission Type (Psych Patients Only)  Admission Status Involuntary  Psychosocial Assessment  Patient Complaints None  Eye Contact Fair  Facial Expression Flat  Affect Appropriate to circumstance  Speech Logical/coherent  Interaction Assertive  Motor Activity Other (Comment) (WDL)  Appearance/Hygiene Unremarkable  Behavior Characteristics Appropriate to situation  Mood Pleasant  Thought Process  Coherency WDL  Content WDL  Delusions None reported or observed  Perception WDL  Hallucination None reported or observed  Judgment Impaired  Confusion None  Danger to Self  Current suicidal ideation? Denies  Danger to Others  Danger to Others None reported or observed

## 2019-11-18 NOTE — Progress Notes (Signed)
Pt rates depression 0/10,  Anxiety 2/10, hopelessness 0. He denies SI/HI. He reports fair sleep. He reports having a fair appetite. He expressed wanting to discharge home today because his mother is getting released from the hospital after being tx for Covid. He reported feeling a lot better and stated that he had a hard time coping with the loss of his grandmother. He stated that it was also hard for him to deal with his mother getting dx with the same illness that killed his grandmother. He appears to have insight about what happened and is receptive to treatment.   Orders reviewed. Vital sign reviewed. Verbal support provided. Pt encouraged to attend groups. 15 minute checks performed for safety.   Pt compliant with tx plan. No medication side effects verbalized by the pt.

## 2019-11-18 NOTE — Discharge Instructions (Signed)
Potential Therapists:  [facility] (formerly [facility]) [address] [place], [place] [address] [phone]  [facility]ulan Sa[facility], [facility], [facility]) [address] [place], [place] [address] [place] [phone]  [facility] (multiple therapists) [address]  [place], [place] [address] [phone]

## 2019-11-18 NOTE — Progress Notes (Signed)
   11/18/19 0047  Psych Admission Type (Psych Patients Only)  Admission Status Involuntary  Psychosocial Assessment  Patient Complaints None  Eye Contact Fair  Facial Expression Anxious  Affect Appropriate to circumstance  Speech Logical/coherent  Interaction Assertive  Motor Activity Fidgety  Appearance/Hygiene Unremarkable  Behavior Characteristics Appropriate to situation  Mood Pleasant  Thought Process  Coherency WDL  Content WDL  Delusions None reported or observed  Perception WDL  Hallucination None reported or observed  Judgment Impaired  Confusion None  Danger to Self  Current suicidal ideation? Denies  Danger to Others  Danger to Others None reported or observed

## 2019-11-18 NOTE — Progress Notes (Signed)
[facility]  11/18/2019 1:34 PM Isaiah ArisKittrell L Heath  MRN:  [id number]  Subjective: Isaiah Heath reports, "I'm doing very well. My mood is good. I feel so relieved. My Cathren Heath is being released from the hospital today. I feel so happy. I slept well. I'm now beginning to realize that my coming here was meant to be because I was not doing well mentally after my grandmother passed, my mother got admitted to the hospital. I was like, am I about to lose my mama too? It was really hard for me at the time".  Objective: Patient is a 39 year old male with a reported past psychiatric history significant for anxiety and depression who [facility] department under involuntary commitment on 11/16/2019. His wife involuntarily committed him. According to the paperwork the patient had been under a lot of stress lately. His grandmother had recently died from Covid related issues, and his mother was apparently in the hospital in [place] for similar circumstances. He stated that he had walked out of the bathroom yesterday, and apparently his eyes were watering from some shape or form. There was some unspecified disagreement between he and his wife, and that she decided that he needed to be involuntarily committed. The paperwork stated that he had not slept in days, and that his anger issues had worsened. He apparently had been stopping around, slamming doors, and yelled like he was going to attack. Isaiah BlackerKittrell is seen, chart reviewed. The chart findings discussed with the treatment team. He presents today cheerful with a bright affect. He is making good eye contact. He is visible on unit. He says he is doing very well. Says he received a good news that his mother is doing well & is about to be released from the hospital today. He expressed how afraid & concerned he was when his grandmother passed & his mother got admitted to the hospital for the same infection that killed his  grandmother. Then, the news that his mother is doing much better about to be discharged did it for him. He is now in agreement that [facility] was probably meant to be as he needed it that time & did not know it. He says he slept well last night. Is taking & tolerating his treatment regimen. Denies any new issues or concerns. He denies any SIHI, AVH, delusional thoughts or paranoia. He does not appear to be responding to any internal stimuli. Denies ahy side effects. Guest at this time may be at his baseline, mentally. Could be ready for discharge tomorrow.  Principal Problem: MDD (major depressive disorder), recurrent episode, severe (HCC)  Diagnosis: Principal Problem:   MDD (major depressive disorder), recurrent episode, severe (HCC) Active Problems:   Cocaine use disorder, moderate, dependence (HCC)   Anxiety   Major depressive disorder, recurrent episode (HCC)  Total Time spent with patient: 25 minutes  Past Psychiatric History: See H&P  Past Medical History:  Past Medical History:  Diagnosis Date  . Anxiety   . Diverticulitis   . Inguinal hernia, left     Past Surgical History:  Procedure Laterality Date  . HERNIA REPAIR Left [place]2010   [place] [place] [facility] [place], Dr. Nelda Marseilleiblet  . INGUINAL HERNIA REPAIR Left 03/03/2016   Procedure: LEFT INGUNIAL HERNIA EXPLORATION;  Surgeon: Jimmye NormanJames Wyatt, MD;  [facility];  Service: General;  Laterality: Left;  . PROSTATE BIOPSY     ok   Family History:  Family History  Problem Relation Age  of Onset  . Diabetes Mother   . Hypertension Mother   . Stroke Father   . Hypertension Father   . Diabetes Maternal Grandmother   . Hypertension Maternal Grandfather   . Hypertension Paternal Grandfather    Family Psychiatric  History: See H&P Social History:  Social History   Substance and Sexual Activity  Alcohol Use Yes  . Alcohol/week: 3.0 standard drinks  . Types: 3 Cans of beer per week     Social History    Substance and Sexual Activity  Drug Use Yes  . Types: Cocaine, Marijuana    Social History   Socioeconomic History  . Marital status: Married    Spouse name: Not on file  . Number of children: Not on file  . Years of education: Not on file  . Highest education level: Not on file  Occupational History  . Not on file  Tobacco Use  . Smoking status: Current Some Day Smoker    Packs/day: 0.25    Years: 10.00    Pack years: 2.50    Types: Cigarettes  . Smokeless tobacco: Never Used  Vaping Use  . Vaping Use: Never used  Substance and Sexual Activity  . Alcohol use: Yes    Alcohol/week: 3.0 standard drinks    Types: 3 Cans of beer per week  . Drug use: Yes    Types: Cocaine, Marijuana  . Sexual activity: Yes  Other Topics Concern  . Not on file  Social History Narrative  . Not on file   Social Determinants of Health   Financial Resource Strain:   . Difficulty of Paying Living Expenses: Not on file  Food Insecurity:   . Worried About Programme researcher, broadcasting/film/video in the Last Year: Not on file  . Ran Out of Food in the Last Year: Not on file  Transportation Needs:   . Lack of Transportation (Medical): Not on file  . Lack of Transportation (Non-Medical): Not on file  Physical Activity:   . Days of Exercise per Week: Not on file  . Minutes of Exercise per Session: Not on file  Stress:   . Feeling of Stress : Not on file  Social Connections:   . Frequency of Communication with Friends and Family: Not on file  . Frequency of Social Gatherings with Friends and Family: Not on file  . Attends Religious Services: Not on file  . Active Member of Clubs or Organizations: Not on file  . Attends Banker Meetings: Not on file  . Marital Status: Not on file   Additional Social History:   Sleep: Good  Appetite:  Good  Current Medications: Current Facility-Administered Medications  Medication Dose Route Frequency Provider Last Rate Last Admin  . acetaminophen  (TYLENOL) tablet 650 mg  650 mg Oral Q6H PRN Armandina Stammer I, NP      . alum & mag hydroxide-simeth (MAALOX/MYLANTA) 200-200-20 MG/5ML suspension 30 mL  30 mL Oral Q4H PRN Gillermo Murdoch, NP      . hydrOXYzine (ATARAX/VISTARIL) tablet 25 mg  25 mg Oral TID PRN Antonieta Pert, MD      . lisinopril (ZESTRIL) tablet 5 mg  5 mg Oral Daily Antonieta Pert, MD   5 mg at 11/18/19 0802  . LORazepam (ATIVAN) tablet 1 mg  1 mg Oral Q6H PRN Antonieta Pert, MD   1 mg at 11/17/19 2037  . sertraline (ZOLOFT) tablet 25 mg  25 mg Oral Daily Antonieta Pert, MD  25 mg at 11/18/19 0802  . traZODone (DESYREL) tablet 50 mg  50 mg Oral QHS PRN Antonieta Pert, MD   50 mg at 11/17/19 2037    Lab Results:  Results for orders placed or performed during the hospital encounter of 11/16/19 (from the past 48 hour(s))  SARS Coronavirus 2 by RT PCR (hospital order, performed in [facility] hospital lab) Nasopharyngeal Nasopharyngeal Swab     Status: None   Collection Time: 11/16/19  7:14 PM   Specimen: Nasopharyngeal Swab  Result Value Ref Range   SARS Coronavirus 2 NEGATIVE NEGATIVE    Comment: (NOTE) SARS-CoV-2 target nucleic acids are NOT DETECTED.  The SARS-CoV-2 RNA is generally detectable in upper and lower respiratory specimens during the acute phase of infection. The lowest concentration of SARS-CoV-2 viral copies this assay can detect is 250 copies / mL. A negative result does not preclude SARS-CoV-2 infection and should not be used as the sole basis for treatment or other patient management decisions.  A negative result may occur with improper specimen collection / handling, submission of specimen other than nasopharyngeal swab, presence of viral mutation(s) within the areas targeted by this assay, and inadequate number of viral copies (<250 copies / mL). A negative result must be combined with clinical observations, patient history, and epidemiological information.  Fact Sheet for  Patients:   BoilerBrush.com.cy  Fact Sheet for Healthcare Providers: https://pope.com/  This test is not yet approved or  cleared by the [place] FDA and has been authorized for detection and/or diagnosis of SARS-CoV-2 by FDA under an Emergency Use Authorization (EUA).  This EUA will remain in effect (meaning this test can be used) for the duration of the COVID-19 declaration under Section 564(b)(1) of the Act, 21 U.S.C. section 360bbb-3(b)(1), unless the authorization is terminated or revoked sooner.  Performed at [facility], [address], [place], [place] [address]   Comprehensive metabolic panel     Status: Abnormal   Collection Time: 11/16/19  8:20 PM  Result Value Ref Range   Sodium 140 135 - 145 mmol/L   Potassium 3.9 3.5 - 5.1 mmol/L   Chloride 103 98 - 111 mmol/L   CO2 28 22 - 32 mmol/L   Glucose, Bld 114 (H) 70 - 99 mg/dL    Comment: Glucose reference range applies only to samples taken after fasting for at least 8 hours.   BUN 15 6 - 20 mg/dL   Creatinine, Ser 4.31 0.61 - 1.24 mg/dL   Calcium 9.6 8.9 - 54.0 mg/dL   Total Protein 8.8 (H) 6.5 - 8.1 g/dL   Albumin 4.9 3.5 - 5.0 g/dL   AST 93 (H) 15 - 41 U/L   ALT 44 0 - 44 U/L   Alkaline Phosphatase 82 38 - 126 U/L   Total Bilirubin 0.6 0.3 - 1.2 mg/dL   GFR calc non Af Amer >60 >60 mL/min   GFR calc Af Amer >60 >60 mL/min   Anion gap 9 5 - 15    Comment: Performed at [facility], [address] [place], [place] [address]  Ethanol     Status: None   Collection Time: 11/16/19  8:20 PM  Result Value Ref Range   Alcohol, Ethyl (B) <10 <10 mg/dL    Comment: (NOTE) Lowest detectable limit for serum alcohol is 10 mg/dL.  For medical purposes only. Performed at [facility], [address], [place], [place] [address]   Urine rapid drug  screen (hosp performed)     Status: Abnormal   Collection Time:  11/16/19  8:20 PM  Result Value Ref Range   Opiates NONE DETECTED NONE DETECTED   Cocaine POSITIVE (A) NONE DETECTED   Benzodiazepines NONE DETECTED NONE DETECTED   Amphetamines NONE DETECTED NONE DETECTED   Tetrahydrocannabinol NONE DETECTED NONE DETECTED   Barbiturates NONE DETECTED NONE DETECTED    Comment: (NOTE) DRUG SCREEN FOR MEDICAL PURPOSES ONLY.  IF CONFIRMATION IS NEEDED FOR ANY PURPOSE, NOTIFY LAB WITHIN 5 DAYS.  LOWEST DETECTABLE LIMITS FOR URINE DRUG SCREEN Drug Class                     Cutoff (ng/mL) Amphetamine and metabolites    1000 Barbiturate and metabolites    200 Benzodiazepine                 200 Tricyclics and metabolites     300 Opiates and metabolites        300 Cocaine and metabolites        300 THC                            50 Performed at [facility], [address], [place], [place] [address]   CBC with Diff     Status: Abnormal   Collection Time: 11/16/19  8:20 PM  Result Value Ref Range   WBC 10.2 4.0 - 10.5 K/uL   RBC 5.01 4.22 - 5.81 MIL/uL   Hemoglobin 14.8 13.0 - 17.0 g/dL   HCT 44.3 39 - 52 %   MCV 92.4 80.0 - 100.0 fL   MCH 29.5 26.0 - 34.0 pg   MCHC 32.0 30.0 - 36.0 g/dL   RDW 15.4 00.8 - 67.6 %   Platelets 270 150 - 400 K/uL   nRBC 0.0 0.0 - 0.2 %   Neutrophils Relative % 81 %   Neutro Abs 8.3 (H) 1.7 - 7.7 K/uL   Lymphocytes Relative 12 %   Lymphs Abs 1.2 0.7 - 4.0 K/uL   Monocytes Relative 6 %   Monocytes Absolute 0.6 0 - 1 K/uL   Eosinophils Relative 0 %   Eosinophils Absolute 0.0 0 - 0 K/uL   Basophils Relative 1 %   Basophils Absolute 0.1 0 - 0 K/uL   Immature Granulocytes 0 %   Abs Immature Granulocytes 0.03 0.00 - 0.07 K/uL    Comment: Performed at [facility], [address], [place], [place] [address]   Blood Alcohol level:  Lab Results  Component Value Date   ETH <10 11/16/2019    Metabolic Disorder Labs: Lab Results  Component Value Date   HGBA1C 5.2 11/28/2018    No results found for: PROLACTIN Lab Results  Component Value Date   CHOL 134 11/28/2018   TRIG 40 11/28/2018   HDL 72 11/28/2018   CHOLHDL 1.9 11/28/2018   LDLCALC 52 11/28/2018   LDLCALC 45 01/03/2017   Physical Findings: AIMS: Facial and Oral Movements Muscles of Facial Expression: None, normal Lips and Perioral Area: None, normal Jaw: None, normal Tongue: None, normal,Extremity Movements Upper (arms, wrists, hands, fingers): None, normal Lower (legs, knees, ankles, toes): None, normal, Trunk Movements Neck, shoulders, hips: None, normal, Overall Severity Severity of abnormal movements (highest score from questions above): None, normal Incapacitation due to abnormal movements: None, normal Patient's awareness of abnormal movements (rate only patient's report): No Awareness, Dental Status Current  problems with teeth and/or dentures?: No Does patient usually wear dentures?: No  CIWA:  CIWA-Ar Total: 4 COWS:     Musculoskeletal: Strength & Muscle Tone: within normal limits Gait & Station: normal Patient leans: N/A  Psychiatric Specialty Exam: Physical Exam Vitals and nursing note reviewed.  HENT:     Head: Normocephalic.     Nose: Nose normal.     Mouth/Throat:     Pharynx: Oropharynx is clear.  Eyes:     Pupils: Pupils are equal, round, and reactive to light.  Cardiovascular:     Rate and Rhythm: Normal rate.  Pulmonary:     Effort: Pulmonary effort is normal.  Genitourinary:    Comments: Deferred Musculoskeletal:        General: Normal range of motion.     Cervical back: Normal range of motion.  Skin:    General: Skin is warm and dry.  Neurological:     Mental Status: He is alert and oriented to person, place, and time. Mental status is at baseline.     Review of Systems  Constitutional: Negative for chills, diaphoresis, fatigue and fever.  HENT: Negative for congestion, rhinorrhea, sneezing and sore throat.   Eyes: Negative for discharge.   Respiratory: Negative for cough, chest tightness, shortness of breath and wheezing.   Cardiovascular: Negative for chest pain and palpitations.  Gastrointestinal: Negative for diarrhea, nausea and vomiting.  Endocrine: Negative for cold intolerance.  Genitourinary: Negative for difficulty urinating.  Musculoskeletal: Negative for arthralgias.  Skin: Negative.   Allergic/Immunologic: Negative for environmental allergies and food allergies.       Allergies: Ciprafloxacin  Neurological: Negative for dizziness.  Psychiatric/Behavioral: Positive for dysphoric mood ("Improving"). Negative for agitation, behavioral problems, confusion, decreased concentration, hallucinations, self-injury, sleep disturbance and suicidal ideas. The patient is not nervous/anxious and is not hyperactive.     Blood pressure 136/82, pulse 91, temperature 98.8 F (37.1 C), temperature source Oral, resp. rate 16, height 6' 1.5" (1.867 m), weight 64.4 kg, SpO2 99 %.Body mass index is 18.48 kg/m.  General Appearance: Casual and Fairly Groomed  Eye Contact:  Good  Speech:  Clear and Coherent and Normal Rate  Volume:  Normal  Mood:  Euthymic and hopeful  Affect:  Appropriate  Thought Process:  Coherent, Goal Directed and Descriptions of Associations: Intact  Orientation:  Full (Time, Place, and Person)  Thought Content:  Logical, denies any hallucinations, delusions or paranoia. Does not appear to be responding to any internal stimuli  Suicidal Thoughts:  Denies any thoughts, plans or intent  Homicidal Thoughts:  Denies  Memory:  Immediate;   Good Recent;   Good Remote;   Good  Judgement:  Intact  Insight:  Present  Psychomotor Activity:  Normal  Concentration:  Concentration: Good and Attention Span: Good  Recall:  Good  Fund of Knowledge:  Fair  Language:  Good  Akathisia:  NA  Handed:  Right  AIMS (if indicated):     Assets:  Communication Skills Desire for Improvement Physical Health Social Support   ADL's:  Intact  Cognition:  WNL  Sleep:  Number of Hours: 6 (per pt. )   Treatment Plan Summary: Daily contact with patient to assess and evaluate symptoms and progress in treatment, Medication management and Patient is a 39 year old male with a reported past psychiatric history significant for anxiety and depression who presented to the [facility] emergency department under involuntary commitment on 11/16/2019. His wife involuntarily committed him.    Continue inpatient  hospitalization. Will continue today 11/18/2019 plan as below except where it is noted.  Depression Continue Sertraline 25 mg po daily.  Anxiety. Continue Vistaril 25 mg po tid prn. Continue Lorazepam 1 mg po Q 6 hrs for CIWA > 10  Insomnia. Continue Trazodone 50 mg po Q hs prn.  HTN Continue Lisinopril 5 mg po daily. Encourage group participation. Discharge disposition plan in progress.  Armandina Stammer, NP, PMHNP, FNP-BC 11/18/2019, 1:34 PM

## 2019-11-18 NOTE — Progress Notes (Signed)
Date:  11/18/2019 Time:  8:00 PM   Group Topic/Focus: Wrap Up Goals Group:   The focus of this group is to reflect on daily goals established daily goals to achieve during treatment and discuss how the patient can incorporate goal setting into their daily lives to aide in recovery.   Participation Level:  Active   Participation Quality:  Appropriate   Cognitive:  Appropriate   Insight: Appropriate   Engagement in Group:  Engaged   Modes of Intervention:  Discussion, Education and Exploration   Additional Comments:  Pt rated his energy at a 10    Foxx, Charlynne Cousins 11/18/2019

## 2019-11-18 NOTE — [facility] Counselor (Signed)
Clinical Social Work Note  Follow-up needed as follows:  Oncologist Care at [facility] [address] [place]  [address] Tel:  [phone] APPOINTMENT NEEDED FOR MEDICATION MANAGEMENT    Potential Therapists (patient declines CSW to make appointments):  Civil engineer, contracting (formerly [facility]) [address] [place], [place] [address] [phone]  [facility], [facility], [facility]) [address] [place], [place] [address] [place] [phone]  [facility] (multiple therapists) [address]  [place], [place] [address] [phone]   Ambrose Mantle, LCSW 11/18/2019, 10:25 AM
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
## 2019-11-18 NOTE — Progress Notes (Signed)
   11/18/19 0046  COVID-19 Daily Checkoff  Have you had a fever (temp > 37.80C/100F)  in the past 24 hours?  No  If you have had runny nose, nasal congestion, sneezing in the past 24 hours, has it worsened? No  COVID-19 EXPOSURE  Have you traveled outside the state in the past 14 days? No  Have you been in contact with someone with a confirmed diagnosis of COVID-19 or PUI in the past 14 days without wearing appropriate PPE? No  Have you been living in the same home as a person with confirmed diagnosis of COVID-19 or a PUI (household contact)? No  Have you been diagnosed with COVID-19? No

## 2019-11-18 NOTE — [facility] Suicide Risk Assessment (Signed)
[facility] INPATIENT:  Family/Significant Other Suicide Prevention Education  Suicide Prevention Education:  Education Completed; wife Isaiah Heath [phone],  (name of family member/significant other) has been identified by the patient as the family member/significant other with whom the patient will be residing, and identified as the person(s) who will aid the patient in the event of a mental health crisis (suicidal ideations/suicide attempt).  With written consent from the patient, the family member/significant other has been provided the following suicide prevention education, prior to the and/or following the discharge of the patient.  The suicide prevention education provided includes the following:  Suicide risk factors  Suicide prevention and interventions  National Suicide Hotline telephone number  [facility] assessment telephone number  [facility] Emergency Assistance 911  [facility] and/or Residential Mobile Crisis Unit telephone number  Request made of family/significant other to:  Remove weapons (e.g., guns, rifles, knives), all items previously/currently identified as safety concern.    Remove drugs/medications (over-the-counter, prescriptions, illicit drugs), all items previously/currently identified as a safety concern.  The family member/significant other verbalizes understanding of the suicide prevention education information provided.  The family member/significant other agrees to remove the items of safety concern listed above.   Wife feels patient sounds better and rested.  However, she is still concerned about his potential to do the same thing again when he gets home, especially since he is resisting therapy appointments and will not admit to her or to hospital that he used cocaine.  When she spoke to him today, he still blames her for him being in the hospital.   Given her knowledge of patient, she feels that he used cocaine directly, did not  get exposed to it through marijuana being laced.  Wife asked for and was provided patient's diagnoses and medications.  She asked for ideas about treatment for the substance abuse, and this was given.  His aftercare appointment with doctor and the list of therapists that is going home with him was discussed.  CSW encouraged her to hold him accountable and to insist on him being in therapy.  Wife wants to be the person to pick patient up from the hospital, is fearful that otherwise he will "call the wrong person to get him."      Lynnell Chad 11/18/2019, 4:15 PM

## 2019-11-19 MED ORDER — TRAZODONE HCL 50 MG PO TABS: 50.0000 mg | ORAL_TABLET | Freq: Every evening | ORAL | 0 refills | Status: DC | PRN

## 2019-11-19 MED ORDER — SERTRALINE HCL 25 MG PO TABS: 25.0000 mg | ORAL_TABLET | Freq: Every day | ORAL | 0 refills | Status: DC

## 2019-11-19 MED ORDER — LISINOPRIL 5 MG PO TABS
5.0000 mg | ORAL_TABLET | Freq: Every day | ORAL | 0 refills | Status: DC
Start: 2019-11-20 — End: 2020-01-08

## 2019-11-19 MED ORDER — HYDROXYZINE HCL 25 MG PO TABS
25.0000 mg | ORAL_TABLET | Freq: Three times a day (TID) | ORAL | 0 refills | Status: DC | PRN
Start: 2019-11-19 — End: 2020-01-08

## 2019-11-19 NOTE — Progress Notes (Signed)
Discharge Note:  Patient discharged home with wife.  Patient denied SI and HI.  Denied A/V hallucinations.  Suicide prevention information given and discussed with patient who stated he understood and had no questions.  Patient stated he received all his belongings, clothing, toiletries, misc items, etc.  Patient stated he appreciated all assistance received from [facility] staff.  All required discharge information given to patient at discharge.

## 2019-11-19 NOTE — Progress Notes (Signed)
Recreation Therapy Notes  Date:  8.30.21 Time: 0930 Location: 300 Hall Group Room  Group Topic: Stress Management  Goal Area(s) Addresses:  Patient will identify positive stress management techniques. Patient will identify benefits of using stress management post d/c.  Intervention: Stress Management  Activity:  Guided Imagery.  LRT read a script that took patients on a journey through the forest.  Patients were to listen and follow along as script was read to engage in activity.    Education:  Stress Management, Discharge Planning.   Education Outcome: Acknowledges Education  Clinical Observations/Feedback: Pt did not attend activity.    [name], LRT/CTRS         [name] A 11/19/2019 11:34 AM 

## 2019-11-19 NOTE — Tx Team (Signed)
Interdisciplinary Treatment and Diagnostic Plan Update  11/19/2019 Time of Session: 0852 Isaiah Heath MRN: [id number]  Principal Diagnosis: MDD (major depressive disorder), recurrent episode, severe (HCC)  Secondary Diagnoses: Principal Problem:   MDD (major depressive disorder), recurrent episode, severe (HCC) Active Problems:   Anxiety   Major depressive disorder, recurrent episode (HCC)   Cocaine use disorder, moderate, dependence (HCC)   Current Medications:  Current Facility-Administered Medications  Medication Dose Route Frequency Provider Last Rate Last Admin  . acetaminophen (TYLENOL) tablet 650 mg  650 mg Oral Q6H PRN Armandina Stammer I, NP      . alum & mag hydroxide-simeth (MAALOX/MYLANTA) 200-200-20 MG/5ML suspension 30 mL  30 mL Oral Q4H PRN Gillermo Murdoch, NP      . hydrOXYzine (ATARAX/VISTARIL) tablet 25 mg  25 mg Oral TID PRN Antonieta Pert, MD   25 mg at 11/19/19 0827  . lisinopril (ZESTRIL) tablet 5 mg  5 mg Oral Daily Antonieta Pert, MD   5 mg at 11/19/19 0825  . LORazepam (ATIVAN) tablet 1 mg  1 mg Oral Q6H PRN Antonieta Pert, MD   1 mg at 11/17/19 2037  . sertraline (ZOLOFT) tablet 25 mg  25 mg Oral Daily Antonieta Pert, MD   25 mg at 11/19/19 0825  . traZODone (DESYREL) tablet 50 mg  50 mg Oral QHS PRN Antonieta Pert, MD   50 mg at 11/18/19 2036   PTA Medications: No medications prior to admission.    Patient Stressors: Loss of grandmother Substance abuse  Patient Strengths: Capable of independent living Wellsite geologist fund of knowledge  Treatment Modalities: Medication Management, Group therapy, Case management,  1 to 1 session with clinician, Psychoeducation, Recreational therapy.   Physician Treatment Plan for Primary Diagnosis: MDD (major depressive disorder), recurrent episode, severe (HCC) Long Term Goal(s): Improvement in symptoms so as ready for discharge Improvement in symptoms so as ready for discharge    Short Term Goals: Ability to identify changes in lifestyle to reduce recurrence of condition will improve Ability to verbalize feelings will improve Ability to disclose and discuss suicidal ideas Ability to demonstrate self-control will improve Ability to identify and develop effective coping behaviors will improve Compliance with prescribed medications will improve Ability to identify triggers associated with substance abuse/mental health issues will improve  Medication Management: Evaluate patient's response, side effects, and tolerance of medication regimen.  Therapeutic Interventions: 1 to 1 sessions, Unit Group sessions and Medication administration.  Evaluation of Outcomes: Adequate for Discharge  Physician Treatment Plan for Secondary Diagnosis: Principal Problem:   MDD (major depressive disorder), recurrent episode, severe (HCC) Active Problems:   Anxiety   Major depressive disorder, recurrent episode (HCC)   Cocaine use disorder, moderate, dependence (HCC)  Long Term Goal(s): Improvement in symptoms so as ready for discharge Improvement in symptoms so as ready for discharge   Short Term Goals: Ability to identify changes in lifestyle to reduce recurrence of condition will improve Ability to verbalize feelings will improve Ability to disclose and discuss suicidal ideas Ability to demonstrate self-control will improve Ability to identify and develop effective coping behaviors will improve Compliance with prescribed medications will improve Ability to identify triggers associated with substance abuse/mental health issues will improve     Medication Management: Evaluate patient's response, side effects, and tolerance of medication regimen.  Therapeutic Interventions: 1 to 1 sessions, Unit Group sessions and Medication administration.  Evaluation of Outcomes: Adequate for Discharge   RN Treatment Plan for Primary Diagnosis:  MDD (major depressive disorder), recurrent  episode, severe (HCC) Long Term Goal(s): Knowledge of disease and therapeutic regimen to maintain health will improve  Short Term Goals: Ability to remain free from injury will improve, Ability to verbalize feelings will improve, Ability to disclose and discuss suicidal ideas and Compliance with prescribed medications will improve  Medication Management: RN will administer medications as ordered by provider, will assess and evaluate patient's response and provide education to patient for prescribed medication. RN will report any adverse and/or side effects to prescribing provider.  Therapeutic Interventions: 1 on 1 counseling sessions, Psychoeducation, Medication administration, Evaluate responses to treatment, Monitor vital signs and CBGs as ordered, Perform/monitor CIWA, COWS, AIMS and Fall Risk screenings as ordered, Perform wound care treatments as ordered.  Evaluation of Outcomes: Adequate for Discharge   LCSW Treatment Plan for Primary Diagnosis: MDD (major depressive disorder), recurrent episode, severe (HCC) Long Term Goal(s): Safe transition to appropriate next level of care at discharge, Engage patient in therapeutic group addressing interpersonal concerns.  Short Term Goals: Engage patient in aftercare planning with referrals and resources, Increase ability to appropriately verbalize feelings, Increase emotional regulation and Increase skills for wellness and recovery  Therapeutic Interventions: Assess for all discharge needs, 1 to 1 time with Social worker, Explore available resources and support systems, Assess for adequacy in community support network, Educate family and significant other(s) on suicide prevention, Complete Psychosocial Assessment, Interpersonal group therapy.  Evaluation of Outcomes: Adequate for Discharge   Progress in Treatment: Attending groups: Yes. Participating in groups: Yes. Taking medication as prescribed: Yes. Toleration medication:  Yes. Family/Significant other contact made: Yes, individual(s) contacted:  wife. Patient understands diagnosis: Yes. Discussing patient identified problems/goals with staff: Yes. Medical problems stabilized or resolved: Yes. Denies suicidal/homicidal ideation: Yes. Issues/concerns per patient self-inventory: No. Other: N/A  New problem(s) identified: No, Describe:  none noted.  New Short Term/Long Term Goal(s): Safe transition to appropriate next level of care at discharge, Engage patient in therapeutic group addressing interpersonal concerns.  Patient Goals:   "Come in and cope with my anxiety"  Discharge Plan or Barriers: Pt to return to home. Pt to follow up with outpatient therapy and medication management services.  Reason for Continuation of Hospitalization: Adequate for discharge.  Estimated Length of Stay: Scheduled discharge 11/19/19. Attendees: Patient: Isaiah Heath 11/19/2019 11:04 AM  Physician: Dr. Jola Babinski, MD 11/19/2019 11:04 AM  Nursing:  11/19/2019 11:04 AM  RN Care Manager: 11/19/2019 11:04 AM  Social Worker: Cyril Loosen, LCSW 11/19/2019 11:04 AM  Recreational Therapist:  11/19/2019 11:04 AM  Other:  11/19/2019 11:04 AM  Other:  11/19/2019 11:04 AM  Other: 11/19/2019 11:04 AM    Scribe for Treatment Team: Leisa Lenz, LCSW 11/19/2019 11:04 AM

## 2019-11-19 NOTE — Discharge Summary (Signed)
Physician Discharge Summary Note  Patient:  Isaiah Heath is an 39 y.o., male MRN:  [id number] DOB:  1980-08-23 Patient phone:  [phone] (home)  Patient address:   [address] [place] [address],  Total Time spent with patient: Greater than 30 minutes  Date of Admission:  11/17/2019  Date of Discharge: 11-19-19  Reason for Admission: Reports indicated that Patient stopping around, slamming doors & yelled like he was going to attack.   Principal Problem: MDD (major depressive disorder), recurrent episode, severe ([place])  Discharge Diagnoses: Principal Problem:   MDD (major depressive disorder), recurrent episode, severe ([place]) Active Problems:   Cocaine use disorder, moderate, dependence ([place])   Anxiety   Major depressive disorder, recurrent episode ([place])  Past Psychiatric History: Major depressive disorder  Past Medical History:  Past Medical History:  Diagnosis Date  . Anxiety   . Diverticulitis   . Inguinal hernia, left     Past Surgical History:  Procedure Laterality Date  . HERNIA REPAIR Left [place] [place] [facility], Dr. Wylene Simmer  . INGUINAL HERNIA REPAIR Left 03/03/2016   Procedure: LEFT INGUNIAL HERNIA EXPLORATION;  Surgeon: Judeth Horn, MD;  Location: [facility];  Service: General;  Laterality: Left;  . PROSTATE BIOPSY     ok   Family History:  Family History  Problem Relation Age of Onset  . Diabetes Mother   . Hypertension Mother   . Stroke Father   . Hypertension Father   . Diabetes Maternal Grandmother   . Hypertension Maternal Grandfather   . Hypertension Paternal Grandfather    Family Psychiatric  History: See H&P Social History:  Social History   Substance and Sexual Activity  Alcohol Use Yes  . Alcohol/week: 3.0 standard drinks  . Types: 3 Cans of beer per week     Social History   Substance and Sexual Activity  Drug Use Yes  . Types: Cocaine, Marijuana    Social History   Socioeconomic History  . Marital  status: Married    Spouse name: Not on file  . Number of children: Not on file  . Years of education: Not on file  . Highest education level: Not on file  Occupational History  . Not on file  Tobacco Use  . Smoking status: Current Some Day Smoker    Packs/day: 0.25    Years: 10.00    Pack years: 2.50    Types: Cigarettes  . Smokeless tobacco: Never Used  Vaping Use  . Vaping Use: Never used  Substance and Sexual Activity  . Alcohol use: Yes    Alcohol/week: 3.0 standard drinks    Types: 3 Cans of beer per week  . Drug use: Yes    Types: Cocaine, Marijuana  . Sexual activity: Yes  Other Topics Concern  . Not on file  Social History Narrative  . Not on file   Social Determinants of Health   Financial Resource Strain:   . Difficulty of Paying Living Expenses: Not on file  Food Insecurity:   . Worried About Charity fundraiser in the Last Year: Not on file  . Ran Out of Food in the Last Year: Not on file  Transportation Needs:   . Lack of Transportation (Medical): Not on file  . Lack of Transportation (Non-Medical): Not on file  Physical Activity:   . Days of Exercise per Week: Not on file  . Minutes of Exercise per Session: Not on file  Stress:   . Feeling of  Stress : Not on file  Social Connections:   . Frequency of Communication with Friends and Family: Not on file  . Frequency of Social Gatherings with Friends and Family: Not on file  . Attends Religious Services: Not on file  . Active Member of Clubs or Organizations: Not on file  . Attends Archivist Meetings: Not on file  . Marital Status: Not on file   Hospital Course: (Per Md's admission evaluation notes): Patient is a 39 year old male with a reported past psychiatric history significant for anxiety and depression who presented to the [facility] emergency department under involuntary commitment on 11/16/2019. His wife involuntarily committed him. According to the paperwork the  patient had been under a lot of stress lately. His grandmother had recently died from Covid related issues, and his mother was apparently in the hospital in [place] for similar circumstances. He stated that he had walked out of the bathroom yesterday, and apparently his eyes were watering from some shape or form. There was some unspecified disagreement between he and his wife, and that she decided that he needed to be involuntarily committed. The paperwork stated that he had not slept in days, and that his anger issues had worsened. He apparently had been stopping around, slamming doors, and yelled like he was going to attack. There is also information that the patient and his wife had recently started counseling. Collateral information from staff stated that the relationship issues between the wife and the patient apparently were not good and the fact that the wife was going to "poke holes in for tires". The patient stated that he had previously been on Zoloft for similar circumstances a year and a half ago. He stated he felt as though that was helpful. He denied any drug use, but his drug screen was positive for cocaine. He denied any use of cocaine. Review of the electronic medical record showed no evidence of previous psychiatric treatment, previous psychiatric evaluations for substance related issues. When we discussed the fact that use of cocaine would lead to behavior similar to what his wife was reporting he again denied any cocaine use. He denied any current racing thoughts or pressured speech. He denied any previous manic symptoms prior to last night. He denied any auditory or visual hallucinations. He denied any suicidal or homicidal ideation. He was admitted to the hospital for evaluation and stabilization.  After the above admission evaluation, Gurpreet's presenting symptoms were noted. He was recommended for mood stabilization treatments. Then medication regimen targeting those  presenting symptoms were discussed with him & initiated with his consent. He was medicated, stabilized & discharged on the medications as listed on his discharge medication lists below. Besides the mood stabilization treatments, He was also enrolled & participated in the group counseling sessions being offered & held on this unit. He learned coping skills. He also presented other significant pre-existing medical issues that required treatment. He was started on, treated & discharged on all his pertinent medications for those health issues. He tolerated his treatment regimen without any adverse effects or reactions reported.  Lucky symptoms responded well to his treatment regimen. His symptoms has subsided & mood stable. Patient has met the maximum benefit of his hospitalization. He is currently mentally & medically stable to continue mental health care & medication management on an outpatient basis as noted below. He is provided with all the necessary information needed to make this appointment without problems. Patient says today that he is so relieved that  his mother is doing better & has been released from the hospital.   During the course of his hospitalization, the 15-minute checks were adequate to ensure Lonn's safety.  Patient did not display any dangerous violent or suicidal behavior on the unit.  He interacted with patients & staff appropriately, participated appropriately in the group sessions/therapies. His medications were addressed & adjusted to meet his needs. He was recommended for outpatient follow-up care & medication management upon discharge to assure continuity of care & mood stability.  At the time of discharge patient is not reporting any acute suicidal/homicidal ideations. He feels more confident about his self-care & in managing his mental health moving forward. He currently denies any new issues or concerns. Education and supportive counseling provided throughout his hospital stay  & upon discharge.  Today upon his discharge evaluation with the attending psychiatrist, Duan shares he is doing well. He denies any other specific concerns. He is sleeping well. His appetite is good. He denies other physical complaints. He denies AH/VH. He feels that his medications have been helpful & is in agreement to continue his current treatment regimen. He was able to engage in safety planning including plan to return to [facility] or contact emergency services if he feels unable to maintain his own safety or the safety of others. Pt had no further questions, comments, or concerns. He left [facility] with all personal belongings in no apparent distress. Transportation per his wife.   Physical Findings: AIMS: Facial and Oral Movements Muscles of Facial Expression: None, normal Lips and Perioral Area: None, normal Jaw: None, normal Tongue: None, normal,Extremity Movements Upper (arms, wrists, hands, fingers): None, normal Lower (legs, knees, ankles, toes): None, normal, Trunk Movements Neck, shoulders, hips: None, normal, Overall Severity Severity of abnormal movements (highest score from questions above): None, normal Incapacitation due to abnormal movements: None, normal Patient's awareness of abnormal movements (rate only patient's report): No Awareness, Dental Status Current problems with teeth and/or dentures?: No Does patient usually wear dentures?: No  CIWA:  CIWA-Ar Total: 3 COWS:     Musculoskeletal: Strength & Muscle Tone: within normal limits Gait & Station: normal Patient leans: N/A  Psychiatric Specialty Exam: Physical Exam Vitals and nursing note reviewed.  HENT:     Head: Normocephalic.     Nose: Nose normal.     Mouth/Throat:     Pharynx: Oropharynx is clear.  Eyes:     Pupils: Pupils are equal, round, and reactive to light.  Cardiovascular:     Rate and Rhythm: Normal rate.     Pulses: Normal pulses.  Pulmonary:     Effort: Pulmonary effort is normal.   Genitourinary:    Comments: Deferred Musculoskeletal:        General: Normal range of motion.     Cervical back: Normal range of motion.  Skin:    General: Skin is warm and dry.  Neurological:     Mental Status: He is alert and oriented to person, place, and time.     Review of Systems  Constitutional: Negative for chills, diaphoresis and fever.  HENT: Negative for congestion, rhinorrhea, sneezing and sore throat.   Eyes: Negative for discharge.  Respiratory: Negative for cough, chest tightness, shortness of breath and wheezing.   Cardiovascular: Negative for chest pain and palpitations.  Gastrointestinal: Negative for diarrhea, nausea and vomiting.  Endocrine: Negative for cold intolerance.  Genitourinary: Negative for difficulty urinating.  Musculoskeletal: Negative for arthralgias.  Skin: Negative.   Allergic/Immunologic:  Allergies: Ciprofloxacin  Neurological: Negative for dizziness, tremors, seizures, syncope, light-headedness and headaches.  Psychiatric/Behavioral: Positive for dysphoric mood (Stabilized with medication prior to discharge) and sleep disturbance (Stabilized with medication prior to discharge). Negative for agitation, behavioral problems, confusion, decreased concentration, hallucinations, self-injury and suicidal ideas. The patient is not nervous/anxious and is not hyperactive.     Blood pressure (!) 123/98, pulse 85, temperature 98.5 F (36.9 C), temperature source Oral, resp. rate 16, height 6' 1.5" (1.867 m), weight 64.4 kg, SpO2 100 %.Body mass index is 18.48 kg/m.  See Md's discharge SRA  Sleep:  Number of Hours: 6.25   Have you used any form of tobacco in the last 30 days? (Cigarettes, Smokeless Tobacco, Cigars, and/or Pipes): Yes  Has this patient used any form of tobacco in the last 30 days? (Cigarettes, Smokeless Tobacco, Cigars, and/or Pipes): N/A  Blood Alcohol level:  Lab Results  Component Value Date   ETH <10 69/67/8938   Metabolic  Disorder Labs:  Lab Results  Component Value Date   HGBA1C 5.2 11/28/2018   No results found for: PROLACTIN Lab Results  Component Value Date   CHOL 134 11/28/2018   TRIG 40 11/28/2018   HDL 72 11/28/2018   CHOLHDL 1.9 11/28/2018   LDLCALC 52 11/28/2018   LDLCALC 45 01/03/2017   See Psychiatric Specialty Exam and Suicide Risk Assessment completed by Attending Physician prior to discharge.  Discharge destination:  Home  Is patient on multiple antipsychotic therapies at discharge:  No   Has Patient had three or more failed trials of antipsychotic monotherapy by history:  No  Recommended Plan for Multiple Antipsychotic Therapies: NA  Allergies as of 11/19/2019      Reactions   Ciprofloxacin Hives      Medication List    TAKE these medications     Indication  hydrOXYzine 25 MG tablet Commonly known as: ATARAX/VISTARIL Take 1 tablet (25 mg total) by mouth 3 (three) times daily as needed for itching or anxiety.  Indication: Feeling Anxious   lisinopril 5 MG tablet Commonly known as: ZESTRIL Take 1 tablet (5 mg total) by mouth daily. For high blood pressure Start taking on: November 20, 2019  Indication: High Blood Pressure Disorder   sertraline 25 MG tablet Commonly known as: ZOLOFT Take 1 tablet (25 mg total) by mouth daily. For depression Start taking on: November 20, 2019  Indication: Major Depressive Disorder   traZODone 50 MG tablet Commonly known as: DESYREL Take 1 tablet (50 mg total) by mouth at bedtime as needed for sleep.  Indication: Trouble Sleeping       Follow-up Information    [facility] Follow up.   Why: Appointment needed for medication management. Contact information: [place] [address] [phone]       Potential Therapists Follow up.   Why: Patient states he will take responsibility for self-referral to therapy. Contact information: Manufacturing engineer [address] [phone]   [facility] [address], [address] [place]   [facility] [place] [phone]              Follow-up recommendations: Activity:  As tolerated Diet: As recommended by your primary care doctor. Keep all scheduled follow-up appointments as recommended.  Comments: Prescriptions given at discharge.  Patient agreeable to plan.  Given opportunity to ask questions.  Appears to feel comfortable with discharge denies any current  suicidal or homicidal thought. Patient is also instructed prior to discharge to: Take all medications as prescribed by his/her mental healthcare provider. Report any adverse effects and or reactions from the medicines to his/her outpatient provider promptly. Patient has been instructed & cautioned: To not engage in alcohol and or illegal drug use while on prescription medicines. In the event of worsening symptoms, patient is instructed to call the crisis hotline, 911 and or go to the nearest ED for appropriate evaluation and treatment of symptoms. To follow-up with his/her primary care provider for your other medical issues, concerns and or health care needs.  Signed: Lindell Spar, NP, PMHNP, FNP-BC 11/19/2019, 8:48 AM

## 2019-11-19 NOTE — Progress Notes (Signed)
D:  Patient's self inventory sheet, patient sleeps good, sleep medication helpful.  Good appetite, normal energy level, good concentration.  Denied depression, hopeless and anxiety.  Denied withdrawals.  Denied SI.  Denied physical problems.  Denied physical pain.  Goal is discharge.  Plans to stay calm, pray, focus on goals.  "Thank you for all that you have done for me."  Does have discharge plans. A:  Medications administered per MD orders.  Emotional support and encouragement given patient. R:  Denied SI and HI, contracts for safety.  Denied A/V hallucinations.  Safety maintained with 15 minute checks.

## 2019-11-19 NOTE — BHH Suicide Risk Assessment (Signed)
[facility] Discharge Suicide Risk Assessment   Principal Problem: MDD (major depressive disorder), recurrent episode, severe (HCC) Discharge Diagnoses: Principal Problem:   MDD (major depressive disorder), recurrent episode, severe (HCC) Active Problems:   Anxiety   Major depressive disorder, recurrent episode (HCC)   Cocaine use disorder, moderate, dependence ([facility])   Total Time spent with patient: 15 minutes  Musculoskeletal: Strength & Muscle Tone: within normal limits Gait & Station: normal Patient leans: N/A  Psychiatric Specialty Exam: Review of Systems  All other systems reviewed and are negative.   Blood pressure (!) 123/98, pulse 85, temperature 98.5 F (36.9 C), temperature source Oral, resp. rate 16, height 6' 1.5" (1.867 m), weight 64.4 kg, SpO2 100 %.Body mass index is 18.48 kg/m.  General Appearance: Casual  Eye Contact::  Good  Speech:  Normal Rate409  Volume:  Normal  Mood:  Euthymic  Affect:  Congruent  Thought Process:  Coherent and Descriptions of Associations: Intact  Orientation:  Full (Time, Place, and Person)  Thought Content:  Logical  Suicidal Thoughts:  No  Homicidal Thoughts:  No  Memory:  Immediate;   Good Recent;   Good Remote;   Good  Judgement:  Intact  Insight:  Fair  Psychomotor Activity:  Normal  Concentration:  Good  Recall:  Fair  Fund of Knowledge:Good  Language: Good  Akathisia:  Negative  Handed:  Right  AIMS (if indicated):     Assets:  Desire for Improvement Housing Resilience  Sleep:  Number of Hours: 6.25  Cognition: WNL  ADL's:  Intact   Mental Status Per Nursing Assessment::   On Admission:  NA  Demographic Factors:  Male and Low socioeconomic status  Loss Factors: NA  Historical Factors: Impulsivity  Risk Reduction Factors:   Living with another person, especially a relative  Continued Clinical Symptoms:  Depression:   Impulsivity Alcohol/Substance Abuse/Dependencies  Cognitive Features That Contribute  To Risk:  None    Suicide Risk:  Minimal: No identifiable suicidal ideation.  Patients presenting with no risk factors but with morbid ruminations; may be classified as minimal risk based on the severity of the depressive symptoms   Follow-up Information    [facility] Follow up.   Why: Appointment needed for medication management. Contact information: [address] [place] [address] [phone]       Potential Therapists Follow up.   Why: Patient states he will take responsibility for self-referral to therapy. Contact information: Civil engineer, contracting [address] [place] [phone]   [facility] [address], [address] [place] [phone]   [facility] [address]  [place] [phone]               Plan Of Care/Follow-up recommendations:  Activity:  ad lib  Antonieta Pert, MD 11/19/2019, 7:30 AM

## 2019-11-19 NOTE — Progress Notes (Signed)
  [facility] Adult Case Management Discharge Plan :  Will you be returning to the same living situation after discharge:  Yes,  home At discharge, do you have transportation home?: Yes,  wife Do you have the ability to pay for your medications: Yes,  insurance  Release of information consent forms completed and in the chart;  Patient's signature needed at discharge.  Patient to Follow up at:  Follow-up Information    [facility]. Go on 01/04/2020.   Why: You are scheduled for labs on 01/04/20 at 8:30 am.  You also have an appointment on 01/08/20 at 3:45 pm for a physical (held in person).  At this time, please discuss any other services you may need with this provider.   Contact information: [address] [place] [address] [phone]       Potential Therapists Follow up.   Why: [facility] is a hospice agency.  they are experts in grief and loss, and a therapist will see you for free when you reach out to set up an appointment Contact information: Civil engineer, contracting [address] [phone]   [facility] [address], [address] [place] [phone]   [facility] [address]  [place] [phone]               Next level of care provider has access to [facility] Link:no  Patient refused to sign release as he stated he does not want his records from this stay sent to his MD  Safety Planning and Suicide Prevention discussed: Yes,  yes  Have you used any form of tobacco in the last 30 days? (Cigarettes, Smokeless Tobacco, Cigars, and/or Pipes): Yes  Has patient been referred to the Quitline?: Patient refused referral  Patient has been referred for addiction treatment: Pt. refused referral  Ida Rogue, LCSW 11/19/2019, 10:18 AM

## 2019-12-23 ENCOUNTER — Emergency Department (HOSPITAL_COMMUNITY): Payer: BLUE CROSS/BLUE SHIELD

## 2019-12-23 ENCOUNTER — Other Ambulatory Visit: Payer: Self-pay

## 2019-12-23 ENCOUNTER — Encounter (HOSPITAL_COMMUNITY): Payer: Self-pay

## 2019-12-23 ENCOUNTER — Emergency Department (HOSPITAL_COMMUNITY)
Admission: EM | Admit: 2019-12-23 | Discharge: 2019-12-23 | Disposition: A | Discharge status: Home / Self Care | Payer: BLUE CROSS/BLUE SHIELD | Attending: Emergency Medicine | Admitting: Emergency Medicine

## 2019-12-23 DIAGNOSIS — Z79899 Other long term (current) drug therapy: Secondary | ICD-10-CM | POA: Diagnosis not present

## 2019-12-23 DIAGNOSIS — F1590 Other stimulant use, unspecified, uncomplicated: Secondary | ICD-10-CM | POA: Insufficient documentation

## 2019-12-23 DIAGNOSIS — R197 Diarrhea, unspecified: Secondary | ICD-10-CM | POA: Insufficient documentation

## 2019-12-23 DIAGNOSIS — R52 Pain, unspecified: Secondary | ICD-10-CM | POA: Diagnosis not present

## 2019-12-23 DIAGNOSIS — F17210 Nicotine dependence, cigarettes, uncomplicated: Secondary | ICD-10-CM | POA: Insufficient documentation

## 2019-12-23 DIAGNOSIS — R10813 Right lower quadrant abdominal tenderness: Secondary | ICD-10-CM | POA: Diagnosis not present

## 2019-12-23 DIAGNOSIS — Z8719 Personal history of other diseases of the digestive system: Secondary | ICD-10-CM | POA: Insufficient documentation

## 2019-12-23 DIAGNOSIS — R1031 Right lower quadrant pain: Secondary | ICD-10-CM | POA: Diagnosis not present

## 2019-12-23 LAB — CBC
HCT: 42.2 % (ref 39.0–52.0)
Hemoglobin: 13.9 g/dL (ref 13.0–17.0)
MCH: 29.8 pg (ref 26.0–34.0)
MCHC: 32.9 g/dL (ref 30.0–36.0)
MCV: 90.6 fL (ref 80.0–100.0)
Platelets: 232 10*3/uL (ref 150–400)
RBC: 4.66 MIL/uL (ref 4.22–5.81)
RDW: 12.1 % (ref 11.5–15.5)
WBC: 8.5 10*3/uL (ref 4.0–10.5)
nRBC: 0 % (ref 0.0–0.2)

## 2019-12-23 LAB — URINALYSIS, ROUTINE W REFLEX MICROSCOPIC
Bacteria, UA: NONE SEEN
Bilirubin Urine: NEGATIVE
Glucose, UA: NEGATIVE mg/dL
Hgb urine dipstick: NEGATIVE
Ketones, ur: NEGATIVE mg/dL
Nitrite: NEGATIVE
Protein, ur: NEGATIVE mg/dL
Specific Gravity, Urine: 1.024 (ref 1.005–1.030)
pH: 6 (ref 5.0–8.0)

## 2019-12-23 LAB — COMPREHENSIVE METABOLIC PANEL
ALT: 19 U/L (ref 0–44)
AST: 21 U/L (ref 15–41)
Albumin: 4.5 g/dL (ref 3.5–5.0)
Alkaline Phosphatase: 72 U/L (ref 38–126)
Anion gap: 11 (ref 5–15)
BUN: 12 mg/dL (ref 6–20)
CO2: 24 mmol/L (ref 22–32)
Calcium: 9.9 mg/dL (ref 8.9–10.3)
Chloride: 102 mmol/L (ref 98–111)
Creatinine, Ser: 0.93 mg/dL (ref 0.61–1.24)
GFR calc Af Amer: >60 mL/min (ref >60)
GFR calc non Af Amer: >60 mL/min (ref >60)
Glucose, Bld: 80 mg/dL (ref 70–99)
Potassium: 3.5 mmol/L (ref 3.5–5.1)
Sodium: 137 mmol/L (ref 135–145)
Total Bilirubin: 0.9 mg/dL (ref 0.3–1.2)
Total Protein: 7.7 g/dL (ref 6.5–8.1)

## 2019-12-23 LAB — LIPASE, BLOOD: Lipase: 26 U/L (ref 11–51)

## 2019-12-23 MED ORDER — METHOCARBAMOL 500 MG PO TABS: 500.0000 mg | ORAL_TABLET | Freq: Two times a day (BID) | ORAL | 0 refills | Status: DC

## 2019-12-23 MED ORDER — MORPHINE SULFATE (PF) 4 MG/ML IV SOLN
4.0000 mg | Freq: Once | INTRAVENOUS | Status: AC
  Administered 2019-12-23: 4 mg via INTRAVENOUS

## 2019-12-23 MED ORDER — IOHEXOL 300 MG/ML  SOLN
100.0000 mL | Freq: Once | INTRAMUSCULAR | Status: AC | PRN
  Administered 2019-12-23: 100 mL via INTRAVENOUS

## 2019-12-23 MED ORDER — MORPHINE SULFATE (PF) 4 MG/ML IV SOLN
4.0000 mg | Freq: Once | INTRAVENOUS | Status: DC
  Filled 2019-12-23: qty 1

## 2019-12-23 MED ORDER — HYDROMORPHONE HCL 1 MG/ML IJ SOLN
1.0000 mg | Freq: Once | INTRAMUSCULAR | Status: AC
  Administered 2019-12-23: 1 mg via INTRAVENOUS
  Filled 2019-12-23: qty 1

## 2019-12-23 MED ORDER — KETOROLAC TROMETHAMINE 60 MG/2ML IM SOLN: 60.0000 mg | Freq: Once | INTRAMUSCULAR | Status: DC

## 2019-12-23 MED ORDER — KETOROLAC TROMETHAMINE 30 MG/ML IJ SOLN
30.0000 mg | Freq: Once | INTRAMUSCULAR | Status: AC
  Administered 2019-12-23: 30 mg via INTRAVENOUS
  Filled 2019-12-23: qty 1

## 2019-12-23 MED ORDER — FENTANYL CITRATE (PF) 100 MCG/2ML IJ SOLN
50.0000 ug | INTRAMUSCULAR | Status: AC | PRN
  Administered 2019-12-23 (×2): 50 ug via INTRAVENOUS
  Filled 2019-12-23 (×2): qty 2

## 2019-12-23 MED ORDER — METHOCARBAMOL 1000 MG/10ML IJ SOLN: 500.0000 mg | Freq: Once | INTRAMUSCULAR | Status: DC

## 2019-12-23 MED ORDER — HYDROCODONE-ACETAMINOPHEN 5-325 MG PO TABS: 2.0000 | ORAL_TABLET | ORAL | 0 refills | Status: AC | PRN

## 2019-12-23 MED ORDER — LORAZEPAM 2 MG/ML IJ SOLN
1.0000 mg | Freq: Once | INTRAMUSCULAR | Status: AC
  Administered 2019-12-23: 1 mg via INTRAVENOUS
  Filled 2019-12-23: qty 1

## 2019-12-23 MED ORDER — SODIUM CHLORIDE (PF) 0.9 % IJ SOLN
INTRAMUSCULAR | Status: AC
  Filled 2019-12-23: qty 50

## 2019-12-23 NOTE — ED Triage Notes (Signed)
Pt complains of right sided abdominal and groin pain starting this morning, tender spot in groin area, has a history of a hernia on the left side, still has gallbladder and appendix. V/S BP 132/84, HR 76, RR 20, 98% RA, Temp 99.

## 2019-12-23 NOTE — ED Provider Notes (Signed)
[facility]-EMERGENCY DEPT Provider Note   CSN: [id number] Arrival date & time: 12/23/19  1639     History Chief Complaint  Patient presents with  . Abdominal Pain    Isaiah Heath is a 39 y.o. male.  HPI 39 year old male with a history of anxiety, diverticulitis, inguinal hernia, major depressive disorder, cocaine abuse presents to the ER with approximately 1 day history of right-sided groin pain which is radiating into his testicles.  Patient states he has a history of a incarcerated left inguinal hernia which required surgery, had bowel perforation associated with this.  He states that this pain feels exactly like the last time he had an inguinal hernia which was back in 2010.  He denies any nausea or vomiting.  States that he has been having some prolonged urination, but has still been able to urinate.  Has had some nonbloody diarrhea over the last 24 hours.  He states that he wanted to be evaluated early as he waited too long.  With his last inguinal hernia.  He denies any redness, swelling, discoloration to his testicles.  Denies any dysuria.  Past Medical History:  Diagnosis Date  . Anxiety   . Diverticulitis   . Inguinal hernia, left     Patient Active Problem List   Diagnosis Date Noted  . MDD (major depressive disorder), recurrent episode, severe (HCC) 11/17/2019  . Major depressive disorder, recurrent episode (HCC) 11/17/2019  . Cocaine use disorder, moderate, dependence (HCC) 11/17/2019  . History of weight change 01/08/2019  . Pyelonephritis 08/11/2018  . Hematuria 06/29/2018  . Abnormal urinalysis 06/29/2018  . Unilateral recurrent inguinal hernia without obstruction or gangrene 06/29/2018  . 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 Metatarsalgia of left foot 07/13/2017  . Edema of left foot 07/13/2017  . Left foot pain 07/13/2017  . Constipation 04/07/2017  . LLQ pain 04/07/2017  . Anxiety 02/21/2017  . Excessive cerumen in ear  canal, right 01/03/2017  . Acute pain of right shoulder 12/29/2016  . Hematochezia 11/23/2016  . Healthcare maintenance 11/23/2016  . Nocturia 11/23/2016  . Pain of left middle finger 11/23/2016  . Dizziness 11/23/2016    Past Surgical History:  Procedure Laterality Date  . HERNIA REPAIR Left [place], [facility] surgery, Dr. Nelda Marseille  . INGUINAL HERNIA REPAIR Left 03/03/2016   Procedure: LEFT INGUNIAL HERNIA EXPLORATION;  Surgeon: Jimmye Norman, MD;  Location: [facility] OR;  Service: General;  Laterality: Left;  . PROSTATE BIOPSY     ok       Family History  Problem Relation Age of Onset  . Diabetes Mother   . Hypertension Mother   . Stroke Father   . Hypertension Father   . Diabetes Maternal Grandmother   . Hypertension Maternal Grandfather   . Hypertension Paternal Grandfather     Social History   Tobacco Use  . Smoking status: Current Some Day Smoker    Packs/day: 0.25    Years: 10.00    Pack years: 2.50    Types: Cigarettes  . Smokeless tobacco: Never Used  Vaping Use  . Vaping Use: Never used  Substance Use Topics  . Alcohol use: Yes    Alcohol/week: 3.0 standard drinks    Types: 3 Cans of beer per week  . Drug use: Yes    Types: Cocaine, Marijuana    Home Medications Prior to Admission medications   Medication Sig Start Date End Date Taking? Authorizing Provider  HYDROcodone-acetaminophen (NORCO/VICODIN) 5-325 MG tablet Take  2 tablets by mouth every 4 (four) hours as needed for up to 3 days. 12/23/19 12/26/19  Mare Ferrari, PA-C  hydrOXYzine (ATARAX/VISTARIL) 25 MG tablet Take 1 tablet (25 mg total) by mouth 3 (three) times daily as needed for itching or anxiety. 11/19/19   Armandina Stammer I, NP  lisinopril (ZESTRIL) 5 MG tablet Take 1 tablet (5 mg total) by mouth daily. For high blood pressure 11/20/19   Nwoko, Nicole Kindred I, NP  methocarbamol (ROBAXIN) 500 MG tablet Take 1 tablet (500 mg total) by mouth 2 (two) times daily. 12/23/19   Mare Ferrari, PA-C    sertraline (ZOLOFT) 25 MG tablet Take 1 tablet (25 mg total) by mouth daily. For depression 11/20/19   Armandina Stammer I, NP  traZODone (DESYREL) 50 MG tablet Take 1 tablet (50 mg total) by mouth at bedtime as needed for sleep. 11/19/19   Armandina Stammer I, NP    Allergies    Ciprofloxacin  Review of Systems   Review of Systems  Constitutional: Negative for chills and fever.  HENT: Negative for ear pain and sore throat.   Eyes: Negative for pain and visual disturbance.  Respiratory: Negative for cough and shortness of breath.   Cardiovascular: Negative for chest pain and palpitations.  Gastrointestinal: Positive for diarrhea. Negative for abdominal pain and vomiting.       Groin pain   Genitourinary: Positive for testicular pain. Negative for dysuria and hematuria.  Musculoskeletal: Negative for arthralgias and back pain.  Skin: Negative for color change and rash.  Neurological: Negative for seizures and syncope.  All other systems reviewed and are negative.   Physical Exam Updated Vital Signs BP (!) 125/91 (BP Location: Left Arm)   Pulse 65   Temp 98.6 F (37 C) (Oral)   Resp 16   Ht 6\' 1"  (1.854 m)   Wt 67.1 kg   SpO2 99%   BMI 19.53 kg/m   Physical Exam Vitals and nursing note reviewed.  Constitutional:      General: He is not in acute distress.    Appearance: He is well-developed. He is not ill-appearing, toxic-appearing or diaphoretic.     Comments: Uncomfortable appearing  HENT:     Head: Normocephalic and atraumatic.  Eyes:     Conjunctiva/sclera: Conjunctivae normal.  Cardiovascular:     Rate and Rhythm: Normal rate and regular rhythm.     Heart sounds: Normal heart sounds. No murmur heard.   Pulmonary:     Effort: Pulmonary effort is normal. No respiratory distress.     Breath sounds: Normal breath sounds.  Abdominal:     General: Abdomen is flat. Bowel sounds are normal.     Palpations: Abdomen is soft.     Tenderness: There is abdominal tenderness in the  right lower quadrant.     Hernia: No hernia is present.  Genitourinary:    Testes: Normal.        Right: Tenderness not present.     Comments: Testicles without significant erythema, swelling, both equal without evidence of one testicle higher than the other.  No epididymal tenderness.  Cremasteric reflex present.  No penile swelling, redness, discharge.  Exquisitely tender to the right groin and testicles bilaterally, however no significant hernia can be noted. Musculoskeletal:     Cervical back: Neck supple.  Skin:    General: Skin is warm.     Coloration: Skin is not pale.     Findings: No rash.  Neurological:     General:  No focal deficit present.     Mental Status: He is alert.  Psychiatric:        Mood and Affect: Mood normal.        Behavior: Behavior normal.     ED Results / Procedures / Treatments   Labs (all labs ordered are listed, but only abnormal results are displayed) Labs Reviewed  URINALYSIS, ROUTINE W REFLEX MICROSCOPIC - Abnormal; Notable for the following components:      Result Value   APPearance CLOUDY (*)    Leukocytes,Ua TRACE (*)    All other components within normal limits  LIPASE, BLOOD  COMPREHENSIVE METABOLIC PANEL  CBC    EKG None  Radiology CT ABDOMEN PELVIS W CONTRAST  Result Date: 12/23/2019 CLINICAL DATA:  Abdominal pain, right lower quadrant EXAM: CT ABDOMEN AND PELVIS WITH CONTRAST TECHNIQUE: Multidetector CT imaging of the abdomen and pelvis was performed using the standard protocol following bolus administration of intravenous contrast. CONTRAST:  100mL OMNIPAQUE IOHEXOL 300 MG/ML  SOLN COMPARISON:  07/01/2019 FINDINGS: LOWER CHEST: Normal. HEPATOBILIARY: Normal hepatic contours. No intra- or extrahepatic biliary dilatation. Normal gallbladder PANCREAS: Normal pancreas. No ductal dilatation or peripancreatic fluid collection. SPLEEN: Normal. ADRENALS/URINARY TRACT: The adrenal glands are normal. No hydronephrosis, nephroureterolithiasis  or solid renal mass. The urinary bladder is normal for degree of distention STOMACH/BOWEL: There is no hiatal hernia. Normal duodenal course and caliber. No small bowel dilatation or inflammation. No focal colonic abnormality. Normal appendix. VASCULAR/LYMPHATIC: Normal course and caliber of the major abdominal vessels. No abdominal or pelvic lymphadenopathy. REPRODUCTIVE: Normal prostate size with symmetric seminal vesicles. MUSCULOSKELETAL. No bony spinal canal stenosis or focal osseous abnormality. OTHER: No inguinal hernia. IMPRESSION: No acute abnormality of the abdomen or pelvis.  No inguinal hernia. Electronically Signed   By: Deatra RobinsonKevin  Herman M.D.   On: 12/23/2019 19:15    Procedures Procedures (including critical care time)  Medications Ordered in ED Medications  sodium chloride (PF) 0.9 % injection (has no administration in time range)  fentaNYL (SUBLIMAZE) injection 50 mcg (50 mcg Intravenous Given 12/23/19 1931)  HYDROmorphone (DILAUDID) injection 1 mg (1 mg Intravenous Given 12/23/19 1748)  iohexol (OMNIPAQUE) 300 MG/ML solution 100 mL (100 mLs Intravenous Contrast Given 12/23/19 1844)  ketorolac (TORADOL) 30 MG/ML injection 30 mg (30 mg Intravenous Given 12/23/19 2046)  LORazepam (ATIVAN) injection 1 mg (1 mg Intravenous Given 12/23/19 2043)  morphine 4 MG/ML injection 4 mg (4 mg Intravenous Given 12/23/19 2049)    ED Course  I have reviewed the triage vital signs and the nursing notes.  Pertinent labs & imaging results that were available during my care of the patient were reviewed by me and considered in my medical decision making (see chart for details).    MDM Rules/Calculators/A&P                         39 year old male with right groin pain radiating to his testicles On presentation he is alert, oriented, nontoxic-appearing, though he does appear to be in pain.  Vitals overall reassuring, he is afebrile, not hypoxic hypotensive or tachypneic.  Physical exam with exquisite right  groin tenderness, moderate right lower quadrant tenderness, though no significant palpable hernia can be noted.  He also has some tenderness to the right testicle, however no noticeable erythema, swelling, discoloration, no epididymal pain.  Normal penile exam.  Chaperone was present for this.  Patient was also seen and evaluated by Dr. Freida BusmanAllen.  Given testicular pain, testicular  torsion was on the differential, however the patient has been having the symptoms for more than 24 hours, and there is no significant abnormality seen on his testicular exam other than the tenderness. Low concern for epididimitis, patient reports most of his pain is in the groin.  There is concern for an incarcerated hernia/ possible appendicitis.  CBC without leukocytosis, CMP without any abnormalities.  UA without evidence of UTI.  Normal lipase.  CT of the abdomen without any significant abnormalities.  Patient still reported that he was in pain, he was given of Dilaudid and 50 of fentanyl on presentation.  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  Potentially could be a musculoskeletal strain given negative CT scan.  On reevaluation, patient states that the Dilaudid only mildly helped, per discussion per Dr. Freida Busman, initiated Ativan, Toradol and morphine.  Patient at this point is requesting discharge.  Pain is mildly improved.  Will send home with 3-day course of Norco for pain control. Robaxin provided.  Return precautions discussed.  Encouraged to follow-up with his PCP, patient states that he may follow-up with his urologist which I think is reasonable.  He voiced understanding and is agreeable.  At this stage in the ED course, the patient is medically screened and stable for discharge.  Final Clinical Impression(s) / ED Diagnoses Final diagnoses:  Right inguinal pain    Rx / DC Orders ED Discharge Orders         Ordered    HYDROcodone-acetaminophen (NORCO/VICODIN) 5-325 MG tablet  Every 4 hours  PRN        12/23/19 2055    methocarbamol (ROBAXIN) 500 MG tablet  2 times daily        12/23/19 2055           Leone Brand 12/23/19 2108    Lorre Nick, MD 12/26/19 [phone]

## 2019-12-23 NOTE — Discharge Instructions (Addendum)
Your work-up today was overall reassuring.  Take Norco as needed for pain.  Please make sure to follow-up with your primary care doctor or your urologist as discussed.  Return to the ER if your symptoms worsen.

## 2019-12-23 NOTE — ED Notes (Signed)
Pt has attempted to pee twice but cannot provide a sample.

## 2019-12-23 NOTE — ED Provider Notes (Signed)
Medical screening examination/treatment/procedure(s) were conducted as a shared visit with non-physician practitioner(s) and myself.  I personally evaluated the patient during the encounter.    39 year old male presents with right-sided lower abdominal discomfort.  History of inguinal hernia and this will similar.  Pain started in his right lower quadrant with down to his testicle.  Denies any urinary symptoms.  On exam he has no testicular swelling although he does have some pain.  He is exquisitely tender in his right inguinal canal.  Suspect possible incarceration.  Will order CT scan.  Very low suspicion for testicular torsion and his symptoms have been present since yesterday   Lorre Nick, MD 12/23/19 1742

## 2020-01-01 ENCOUNTER — Other Ambulatory Visit: Payer: Self-pay | Admitting: Physician Assistant

## 2020-01-01 DIAGNOSIS — Z0000 Encounter for general adult medical examination without abnormal findings: Secondary | ICD-10-CM

## 2020-01-04 ENCOUNTER — Other Ambulatory Visit: Payer: BLUE CROSS/BLUE SHIELD

## 2020-01-08 ENCOUNTER — Ambulatory Visit (INDEPENDENT_AMBULATORY_CARE_PROVIDER_SITE_OTHER): Payer: BLUE CROSS/BLUE SHIELD | Admitting: Physician Assistant

## 2020-01-08 ENCOUNTER — Other Ambulatory Visit: Payer: Self-pay

## 2020-01-08 ENCOUNTER — Encounter: Payer: Self-pay | Admitting: Physician Assistant

## 2020-01-08 ENCOUNTER — Telehealth: Payer: Self-pay | Admitting: Physician Assistant

## 2020-01-08 VITALS — BP 130/81 | HR 69 | Temp 98.7°F | Ht 73.0 in | Wt 149.0 lb

## 2020-01-08 DIAGNOSIS — F332 Major depressive disorder, recurrent severe without psychotic features: Secondary | ICD-10-CM | POA: Diagnosis not present

## 2020-01-08 DIAGNOSIS — Z1159 Encounter for screening for other viral diseases: Secondary | ICD-10-CM

## 2020-01-08 DIAGNOSIS — F1420 Cocaine dependence, uncomplicated: Secondary | ICD-10-CM

## 2020-01-08 DIAGNOSIS — H6690 Otitis media, unspecified, unspecified ear: Secondary | ICD-10-CM

## 2020-01-08 DIAGNOSIS — Z1211 Encounter for screening for malignant neoplasm of colon: Secondary | ICD-10-CM | POA: Diagnosis not present

## 2020-01-08 DIAGNOSIS — M25561 Pain in right knee: Secondary | ICD-10-CM

## 2020-01-08 DIAGNOSIS — I10 Essential (primary) hypertension: Secondary | ICD-10-CM

## 2020-01-08 DIAGNOSIS — Z0000 Encounter for general adult medical examination without abnormal findings: Secondary | ICD-10-CM | POA: Diagnosis not present

## 2020-01-08 DIAGNOSIS — Z8042 Family history of malignant neoplasm of prostate: Secondary | ICD-10-CM

## 2020-01-08 DIAGNOSIS — H9201 Otalgia, right ear: Secondary | ICD-10-CM

## 2020-01-08 DIAGNOSIS — N400 Enlarged prostate without lower urinary tract symptoms: Secondary | ICD-10-CM

## 2020-01-08 DIAGNOSIS — F419 Anxiety disorder, unspecified: Secondary | ICD-10-CM

## 2020-01-08 LAB — POC HEMOCCULT BLD/STL (OFFICE/1-CARD/DIAGNOSTIC): Fecal Occult Blood, POC: NEGATIVE

## 2020-01-08 MED ORDER — TRAZODONE HCL 50 MG PO TABS
50.0000 mg | ORAL_TABLET | Freq: Every evening | ORAL | 1 refills | Status: AC | PRN
Start: 2020-01-08 — End: ?

## 2020-01-08 MED ORDER — HYDROXYZINE HCL 25 MG PO TABS: 25.0000 mg | ORAL_TABLET | Freq: Three times a day (TID) | ORAL | 0 refills | Status: AC | PRN

## 2020-01-08 MED ORDER — METHOCARBAMOL 500 MG PO TABS: 500.0000 mg | ORAL_TABLET | Freq: Two times a day (BID) | ORAL | 0 refills | Status: AC

## 2020-01-08 MED ORDER — SERTRALINE HCL 25 MG PO TABS: 25.0000 mg | ORAL_TABLET | Freq: Every day | ORAL | 0 refills | Status: AC

## 2020-01-08 MED ORDER — LISINOPRIL 5 MG PO TABS: 5.0000 mg | ORAL_TABLET | Freq: Every day | ORAL | 0 refills | Status: AC

## 2020-01-08 MED ORDER — AMOXICILLIN-POT CLAVULANATE 875-125 MG PO TABS: 1.0000 | ORAL_TABLET | Freq: Two times a day (BID) | ORAL | 0 refills | Status: AC

## 2020-01-08 NOTE — Progress Notes (Signed)
Male physical   Impression and Recommendations:    1. Healthcare maintenance   2. Severe episode of recurrent major depressive disorder, without psychotic features (HCC)   3. Cocaine use disorder, moderate, dependence (HCC)   4. Need for hepatitis C screening test   5. Enlarged prostate   6. Right knee pain, unspecified chronicity   7. Screening for colon cancer   8. Otalgia of right ear   9. Acute otitis media, unspecified otitis media type   10. Family history of prostate cancer   11. Anxiety   12. Hypertension, unspecified type      1) Anticipatory Guidance: Discussed skin CA prevention and sunscreen when outside along with skin surveillance; eating a balanced and modest diet; physical activity at least 25 minutes per day or minimum of 150 min/ week moderate to intense activity.  2) Immunizations / Screenings / Labs:   All immunizations are up-to-date per recommendations or will be updated today if pt allows.    - Patient understands with dental and vision screens they will schedule independently.  - Will obtain CBC, CMP, HgA1c, Lipid panel, TSH and vit D when fasting, if not already done past 12 mo/ recently  -UTD on Tdap. -Declined influenza vaccine  3) Weight:  BMI meaning discussed with patient.  Discussed goal to improve diet habits to improve overall feelings of well being and objective health data. Improve nutrient density of diet through increasing intake of fruits and vegetables and decreasing saturated fats, white flour products and refined sugars.   4) Healthcare Maintenance: -Patient requesting medication refills.  Patient was hospitalized for severe MDD 10/2018 and started on hydroxyzine, lisinopril for blood pressure, sertraline and trazodone.  Provided refills and advised to schedule 4 month follow-up for medication management. -Will send Augmentin for right ear otalgia and signs of developing infection. -Follow a heart healthy diet and stay as active as  possible. -Schedule a lab visit for fasting blood work. Will add uric acid to evaluate for possible gout for knee pain. Will add PSA due to slight enlargement of prostate on exam and FMHx of prostate cancer. -Recommend to avoid substance use and reduce tobacco use. -Reduce diuretics such as caffeine and avoid drinking liquids before bedtime.   Orders Placed This Encounter  Procedures  . CBC with Differential/Platelet    Standing Status:   Future    Standing Expiration Date:   07/08/2020  . Comprehensive metabolic panel    Standing Status:   Future    Standing Expiration Date:   07/08/2020  . Hemoglobin A1c    Standing Status:   Future    Standing Expiration Date:   07/08/2020  . Hepatitis C antibody    Standing Status:   Future    Standing Expiration Date:   07/08/2020  . Lipid panel    Standing Status:   Future    Standing Expiration Date:   07/08/2020  . TSH    Standing Status:   Future    Standing Expiration Date:   07/08/2020  . PSA    Standing Status:   Future    Standing Expiration Date:   01/07/2021  . Uric acid    Standing Status:   Future    Standing Expiration Date:   01/07/2021  . POC Hemoccult Bld/Stl (1-Cd Office Dx)    Meds ordered this encounter  Medications  . hydrOXYzine (ATARAX/VISTARIL) 25 MG tablet    Sig: Take 1 tablet (25 mg total) by mouth 3 (three)  times daily as needed for itching or anxiety.    Dispense:  90 tablet    Refill:  0  . lisinopril (ZESTRIL) 5 MG tablet    Sig: Take 1 tablet (5 mg total) by mouth daily. For high blood pressure    Dispense:  90 tablet    Refill:  0  . methocarbamol (ROBAXIN) 500 MG tablet    Sig: Take 1 tablet (500 mg total) by mouth 2 (two) times daily.    Dispense:  20 tablet    Refill:  0  . sertraline (ZOLOFT) 25 MG tablet    Sig: Take 1 tablet (25 mg total) by mouth daily. For depression    Dispense:  90 tablet    Refill:  0  . traZODone (DESYREL) 50 MG tablet    Sig: Take 1 tablet (50 mg total) by mouth at  bedtime as needed for sleep.    Dispense:  30 tablet    Refill:  1  . amoxicillin-clavulanate (AUGMENTIN) 875-125 MG tablet    Sig: Take 1 tablet by mouth 2 (two) times daily.    Dispense:  20 tablet    Refill:  0    Order Specific Question:   Supervising Provider    Answer:   Nani Gasser D [[id number]]     Return in about 4 months (around 05/10/2020) for Mood, HTN; lab visit for FBW .   Reminded pt important of f-up preventative CPE in 1 year, this is in addition to any chronic care visits.    Gross side effects, risk and benefits, and alternatives of medications discussed with patient.  Patient is aware that all medications have potential side effects and we are unable to predict every side effect or drug-drug interaction that may occur.  Expresses verbal understanding and consents to current therapy plan and treatment regimen.  Please see AVS handed out to patient at the end of our visit for further patient instructions/ counseling done pertaining to today's office visit.  Note:  This note was prepared with assistance of Dragon voice recognition software. Occasional wrong-word or sound-a-like substitutions may have occurred due to the inherent limitations of voice recognition software.   Subjective:        CC: CPE   HPI: Isaiah Heath is a 39 y.o. male who presents to [facility] today for a yearly health maintenance exam.     Health Maintenance Summary  - Reviewed and updated, unless pt declines services.   Tobacco History Reviewed:  Y, current some day smoker Alcohol / drug use: no excessive use; reports substance use  Dental Home: Y   Eye exams: Not recently Dermatology home: N  Male history: STD concerns:   none Additional penile/ urinary concerns: frequent urination at night, wakes up 5-6 times; family hx of prostate cancer   Additional concerns beyond Health Maintenance issues:   Right knee pain with intermittent swelling.  Denies trauma, redness, or fever.    Immunization History  Administered Date(s) Administered  . Tdap 01/08/2019     Health Maintenance  Topic Date Due  . Hepatitis C Screening  Never done  . COVID-19 Vaccine (1) 01/24/2020 (Originally 07/29/1992)  . INFLUENZA VACCINE  06/19/2020 (Originally 10/21/2019)  . TETANUS/TDAP  01/07/2029  . HIV Screening  Completed       Wt Readings from Last 3 Encounters:  01/08/20 149 lb (67.6 kg)  12/23/19 148 lb (67.1 kg)  11/17/19 142 lb (64.4 kg)   BP  Readings from Last 3 Encounters:  01/08/20 130/81  12/23/19 (!) 125/91  11/19/19 (!) 123/98   Pulse Readings from Last 3 Encounters:  01/08/20 69  12/23/19 65  11/19/19 85    Patient Active Problem List   Diagnosis Date Noted  . MDD (major depressive disorder), recurrent episode, severe (HCC) 11/17/2019  . Major depressive disorder, recurrent episode (HCC) 11/17/2019  . Cocaine use disorder, moderate, dependence (HCC) 11/17/2019  . History of weight change 01/08/2019  . Pyelonephritis 08/11/2018  . Hematuria 06/29/2018  . Abnormal urinalysis 06/29/2018  . Unilateral recurrent inguinal hernia without obstruction or gangrene 06/29/2018  . 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 Metatarsalgia of left foot 07/13/2017  . Edema of left foot 07/13/2017  . Left foot pain 07/13/2017  . Constipation 04/07/2017  . LLQ pain 04/07/2017  . Anxiety 02/21/2017  . Excessive cerumen in ear canal, right 01/03/2017  . Acute pain of right shoulder 12/29/2016  . Hematochezia 11/23/2016  . Healthcare maintenance 11/23/2016  . Nocturia 11/23/2016  . Pain of left middle finger 11/23/2016  . Dizziness 11/23/2016    Past Medical History:  Diagnosis Date  . Anxiety   . Diverticulitis   . Inguinal hernia, left     Past Surgical History:  Procedure Laterality Date  . HERNIA REPAIR Left [place], [facility] surgery, Dr. Nelda Marseille  . INGUINAL HERNIA REPAIR Left 03/03/2016   Procedure:  LEFT INGUNIAL HERNIA EXPLORATION;  Surgeon: Jimmye Norman, MD;  Location: [facility] OR;  Service: General;  Laterality: Left;  . PROSTATE BIOPSY     ok    Family History  Problem Relation Age of Onset  . Diabetes Mother   . Hypertension Mother   . Stroke Father   . Hypertension Father   . Diabetes Maternal Grandmother   . Hypertension Maternal Grandfather   . Hypertension Paternal Grandfather     Social History   Substance and Sexual Activity  Drug Use Yes  . Types: Cocaine, Marijuana  ,  Social History   Substance and Sexual Activity  Alcohol Use Yes  . Alcohol/week: 3.0 standard drinks  . Types: 3 Cans of beer per week  ,  Social History   Tobacco Use  Smoking Status Current Some Day Smoker  . Packs/day: 0.25  . Years: 10.00  . Pack years: 2.50  . Types: Cigarettes  Smokeless Tobacco Never Used  ,  Social History   Substance and Sexual Activity  Sexual Activity Yes    Patient's Medications  New Prescriptions   AMOXICILLIN-CLAVULANATE (AUGMENTIN) 875-125 MG TABLET    Take 1 tablet by mouth 2 (two) times daily.  Previous Medications   No medications on file  Modified Medications   Modified Medication Previous Medication   HYDROXYZINE (ATARAX/VISTARIL) 25 MG TABLET hydrOXYzine (ATARAX/VISTARIL) 25 MG tablet      Take 1 tablet (25 mg total) by mouth 3 (three) times daily as needed for itching or anxiety.    Take 1 tablet (25 mg total) by mouth 3 (three) times daily as needed for itching or anxiety.   LISINOPRIL (ZESTRIL) 5 MG TABLET lisinopril (ZESTRIL) 5 MG tablet      Take 1 tablet (5 mg total) by mouth daily. For high blood pressure    Take 1 tablet (5 mg total) by mouth daily. For high blood pressure   METHOCARBAMOL (ROBAXIN) 500 MG TABLET methocarbamol (ROBAXIN) 500 MG tablet      Take 1 tablet (500 mg total) by mouth 2 (two) times daily.  Take 1 tablet (500 mg total) by mouth 2 (two) times daily.   SERTRALINE (ZOLOFT) 25 MG TABLET sertraline (ZOLOFT) 25 MG  tablet      Take 1 tablet (25 mg total) by mouth daily. For depression    Take 1 tablet (25 mg total) by mouth daily. For depression   TRAZODONE (DESYREL) 50 MG TABLET traZODone (DESYREL) 50 MG tablet      Take 1 tablet (50 mg total) by mouth at bedtime as needed for sleep.    Take 1 tablet (50 mg total) by mouth at bedtime as needed for sleep.  Discontinued Medications   No medications on file    Ciprofloxacin  Review of Systems: General:   Denies fever, chills, unexplained weight loss.  Optho/Auditory:   Denies visual changes, blurred vision/LOV Respiratory:   Denies SOB, DOE more than baseline levels.   Cardiovascular:   Denies chest pain, palpitations, new onset peripheral edema  Gastrointestinal:   Denies nausea, vomiting, diarrhea.  Genitourinary: Denies dysuria, flank pain or discharge from genitals. +urinary frequency Endocrine:     Denies hot or cold intolerance, polyuria, polydipsia. Musculoskeletal:   Denies unexplained myalgias, gait problems, + right knee pain Skin:  Denies rash, suspicious lesions Neurological:     Denies dizziness, unexplained weakness, numbness  Psychiatric/Behavioral:   Denies mood changes, suicidal or homicidal ideations, hallucinations    Objective:     Blood pressure 130/81, pulse 69, temperature 98.7 F (37.1 C), temperature source Oral, height  (1.854 m), weight 149 lb (67.6 kg), SpO2 96 %. Body mass index is 19.66 kg/m. General Appearance:    Alert, cooperative, no distress, appears stated age  Head:    Normocephalic, without obvious abnormality, atraumatic  Eyes:    PERRL, conjunctiva/corneas clear, EOM's intact, both eyes (glassy eyes noted)  Ears:    Fluid level behind TM of left ear, Erythematous non-bluging TM of right ear with tenderness; negative Pinna and Tragus sign  Nose:   Nares normal, septum midline, mucosa normal, no drainage    or sinus tenderness  Throat:   Lips w/o lesion, mucosa moist, and tongue normal; teeth and  gums normal  Neck:   Supple, symmetrical, trachea midline, no adenopathy;    thyroid:  no enlargement/tenderness/nodules;   Back:     Symmetric, no curvature, ROM normal, no CVA tenderness  Lungs:     Clear to auscultation bilaterally, respirations unlabored, no Wh/ R/ R  Chest Wall:    No tenderness or gross deformity; normal excursion   Heart:    Regular rate and rhythm, S1 and S2 normal, no murmur  Abdomen:     Soft, non-tender, bowel sounds active all four quadrants, No G/R/R, no masses, no organomegaly  Genitalia:    Deferred.  Rectal:    Normal tone, mild prostate enlargement, no tenderness; guaiac negative stool  Extremities:   Extremities normal, atraumatic, no cyanosis or gross edema  Pulses:   2+ and symmetric all extremities  Skin:   Warm, dry, Skin color, texture, turgor normal, no obvious rashes or lesions  M-Sk:   Ambulates * 4, no gross deformities, tone WNL  Neurologic:   CNII-XII grossly intact, normal strength, sensation and reflexes    Throughout Psych:  No HI/SI, judgement and insight good, Euthymic mood. Full Affect.

## 2020-01-08 NOTE — Patient Instructions (Signed)
Preventive Care 41-41 Years Old, Male Preventive care refers to lifestyle choices and visits with your health care provider that can promote health and wellness. This includes:  A yearly physical exam. This is also called an annual well check.  Regular dental and eye exams.  Immunizations.  Screening for certain conditions.  Healthy lifestyle choices, such as eating a healthy diet, getting regular exercise, not using drugs or products that contain nicotine and tobacco, and limiting alcohol use. What can I expect for my preventive care visit? Physical exam Your health care provider will check:  Height and weight. These may be used to calculate body mass index (BMI), which is a measurement that tells if you are at a healthy weight.  Heart rate and blood pressure.  Your skin for abnormal spots. Counseling Your health care provider may ask you questions about:  Alcohol, tobacco, and drug use.  Emotional well-being.  Home and relationship well-being.  Sexual activity.  Eating habits.  Work and work Statistician. What immunizations do I need?  Influenza (flu) vaccine  This is recommended every year. Tetanus, diphtheria, and pertussis (Tdap) vaccine  You may need a Td booster every 10 years. Varicella (chickenpox) vaccine  You may need this vaccine if you have not already been vaccinated. Zoster (shingles) vaccine  You may need this after age 64. Measles, mumps, and rubella (MMR) vaccine  You may need at least one dose of MMR if you were born in 1957 or later. You may also need a second dose. Pneumococcal conjugate (PCV13) vaccine  You may need this if you have certain conditions and were not previously vaccinated. Pneumococcal polysaccharide (PPSV23) vaccine  You may need one or two doses if you smoke cigarettes or if you have certain conditions. Meningococcal conjugate (MenACWY) vaccine  You may need this if you have certain conditions. Hepatitis A  vaccine  You may need this if you have certain conditions or if you travel or work in places where you may be exposed to hepatitis A. Hepatitis B vaccine  You may need this if you have certain conditions or if you travel or work in places where you may be exposed to hepatitis B. Haemophilus influenzae type b (Hib) vaccine  You may need this if you have certain risk factors. Human papillomavirus (HPV) vaccine  If recommended by your health care provider, you may need three doses over 6 months. You may receive vaccines as individual doses or as more than one vaccine together in one shot (combination vaccines). Talk with your health care provider about the risks and benefits of combination vaccines. What tests do I need? Blood tests  Lipid and cholesterol levels. These may be checked every 5 years, or more frequently if you are over 60 years old.  Hepatitis C test.  Hepatitis B test. Screening  Lung cancer screening. You may have this screening every year starting at age 43 if you have a 30-pack-year history of smoking and currently smoke or have quit within the past 15 years.  Prostate cancer screening. Recommendations will vary depending on your family history and other risks.  Colorectal cancer screening. All adults should have this screening starting at age 72 and continuing until age 2. Your health care provider may recommend screening at age 14 if you are at increased risk. You will have tests every 1-10 years, depending on your results and the type of screening test.  Diabetes screening. This is done by checking your blood sugar (glucose) after you have not eaten  for a while (fasting). You may have this done every 1-3 years.  Sexually transmitted disease (STD) testing. Follow these instructions at home: Eating and drinking  Eat a diet that includes fresh fruits and vegetables, whole grains, lean protein, and low-fat dairy products.  Take vitamin and mineral supplements as  recommended by your health care provider.  Do not drink alcohol if your health care provider tells you not to drink.  If you drink alcohol: ? Limit how much you have to 0-2 drinks a day. ? Be aware of how much alcohol is in your drink. In the [place] one drink equals one 12 oz bottle of beer (355 mL), one 5 oz glass of wine (148 mL), or one 1 oz glass of hard liquor (44 mL). Lifestyle  Take daily care of your teeth and gums.  Stay active. Exercise for at least 30 minutes on 5 or more days each week.  Do not use any products that contain nicotine or tobacco, such as cigarettes, e-cigarettes, and chewing tobacco. If you need help quitting, ask your health care provider.  If you are sexually active, practice safe sex. Use a condom or other form of protection to prevent STIs (sexually transmitted infections).  Talk with your health care provider about taking a low-dose aspirin every day starting at age 53. What's next?  Go to your health care provider once a year for a well check visit.  Ask your health care provider how often you should have your eyes and teeth checked.  Stay up to date on all vaccines. This information is not intended to replace advice given to you by your health care provider. Make sure you discuss any questions you have with your health care provider. Document Revised: 03/02/2018 Document Reviewed: 03/02/2018 Elsevier Patient Education  2020 [employer].

## 2020-01-08 NOTE — Telephone Encounter (Signed)
Antibiotic- Augmentin sent in.  Thank you, [name]

## 2020-01-08 NOTE — Telephone Encounter (Signed)
Patient was in today and needs an antibiotic for his ear infection. I didn't see anything on it in the notes. Thanks

## 2020-01-09 NOTE — Telephone Encounter (Signed)
LVM informing pt of RX.  T. Dhanvin Szeto, CMA 

## 2020-01-22 ENCOUNTER — Other Ambulatory Visit: Payer: Self-pay | Admitting: Physician Assistant

## 2020-01-22 DIAGNOSIS — F331 Major depressive disorder, recurrent, moderate: Secondary | ICD-10-CM | POA: Diagnosis not present

## 2020-01-22 DIAGNOSIS — Z0000 Encounter for general adult medical examination without abnormal findings: Secondary | ICD-10-CM

## 2020-01-23 ENCOUNTER — Other Ambulatory Visit: Payer: Self-pay | Admitting: Physician Assistant

## 2020-01-23 DIAGNOSIS — I10 Essential (primary) hypertension: Secondary | ICD-10-CM

## 2020-01-23 DIAGNOSIS — Z0000 Encounter for general adult medical examination without abnormal findings: Secondary | ICD-10-CM

## 2020-01-24 ENCOUNTER — Other Ambulatory Visit: Payer: BLUE CROSS/BLUE SHIELD

## 2020-01-24 ENCOUNTER — Other Ambulatory Visit: Payer: Self-pay

## 2020-01-24 DIAGNOSIS — M25561 Pain in right knee: Secondary | ICD-10-CM | POA: Diagnosis not present

## 2020-01-24 DIAGNOSIS — I10 Essential (primary) hypertension: Secondary | ICD-10-CM

## 2020-01-24 DIAGNOSIS — N400 Enlarged prostate without lower urinary tract symptoms: Secondary | ICD-10-CM

## 2020-01-24 DIAGNOSIS — Z0000 Encounter for general adult medical examination without abnormal findings: Secondary | ICD-10-CM | POA: Diagnosis not present

## 2020-01-24 DIAGNOSIS — Z8042 Family history of malignant neoplasm of prostate: Secondary | ICD-10-CM | POA: Diagnosis not present

## 2020-01-24 DIAGNOSIS — Z1159 Encounter for screening for other viral diseases: Secondary | ICD-10-CM | POA: Diagnosis not present

## 2020-01-25 LAB — PSA: Prostate Specific Ag, Serum: 0.8 ng/mL (ref 0.0–4.0)

## 2020-01-25 LAB — CBC
Hematocrit: 40.7 % (ref 37.5–51.0)
Hemoglobin: 13.2 g/dL (ref 13.0–17.7)
MCH: 29.2 pg (ref 26.6–33.0)
MCHC: 32.4 g/dL (ref 31.5–35.7)
MCV: 90 fL (ref 79–97)
Platelets: 207 10*3/uL (ref 150–450)
RBC: 4.52 x10E6/uL (ref 4.14–5.80)
RDW: 10.9 % — ABNORMAL LOW (ref 11.6–15.4)
WBC: 4.6 10*3/uL (ref 3.4–10.8)

## 2020-01-25 LAB — LIPID PANEL
Chol/HDL Ratio: 1.9 ratio (ref 0.0–5.0)
Cholesterol, Total: 129 mg/dL (ref 100–199)
HDL: 68 mg/dL (ref >39)
LDL Chol Calc (NIH): 50 mg/dL (ref 0–99)
Triglycerides: 48 mg/dL (ref 0–149)
VLDL Cholesterol Cal: 11 mg/dL (ref 5–40)

## 2020-01-25 LAB — COMPREHENSIVE METABOLIC PANEL
ALT: 10 IU/L (ref 0–44)
AST: 18 IU/L (ref 0–40)
Albumin/Globulin Ratio: 1.8 (ref 1.2–2.2)
Albumin: 4.3 g/dL (ref 4.0–5.0)
Alkaline Phosphatase: 82 IU/L (ref 44–121)
BUN/Creatinine Ratio: 9 (ref 9–20)
BUN: 9 mg/dL (ref 6–24)
Bilirubin Total: 0.6 mg/dL (ref 0.0–1.2)
CO2: 24 mmol/L (ref 20–29)
Calcium: 9.4 mg/dL (ref 8.7–10.2)
Chloride: 105 mmol/L (ref 96–106)
Creatinine, Ser: 0.95 mg/dL (ref 0.76–1.27)
GFR calc Af Amer: 114 mL/min/{1.73_m2} (ref >59)
GFR calc non Af Amer: 99 mL/min/{1.73_m2} (ref >59)
Globulin, Total: 2.4 g/dL (ref 1.5–4.5)
Glucose: 111 mg/dL — ABNORMAL HIGH (ref 65–99)
Potassium: 4.1 mmol/L (ref 3.5–5.2)
Sodium: 141 mmol/L (ref 134–144)
Total Protein: 6.7 g/dL (ref 6.0–8.5)

## 2020-01-25 LAB — HEMOGLOBIN A1C
Est. average glucose Bld gHb Est-mCnc: 94 mg/dL
Hgb A1c MFr Bld: 4.9 % (ref 4.8–5.6)

## 2020-01-25 LAB — URIC ACID: Uric Acid: 4.5 mg/dL (ref 3.8–8.4)

## 2020-01-25 LAB — HEPATITIS C ANTIBODY: Hep C Virus Ab: 0.6 s/co ratio (ref 0.0–0.9)

## 2020-01-25 LAB — TSH: TSH: 1.01 u[IU]/mL (ref 0.450–4.500)

## 2020-01-28 ENCOUNTER — Telehealth: Payer: Self-pay | Admitting: Physician Assistant

## 2020-01-28 NOTE — Telephone Encounter (Signed)
Patient called requesting to know if his lab results had come back. Thanks

## 2020-01-28 NOTE — Telephone Encounter (Signed)
Please review labs and advise. AS, CMA 

## 2020-01-28 NOTE — Telephone Encounter (Signed)
Sent patient a Clinical cytogeneticist message with results.  Thank you, [name]

## 2020-01-30 ENCOUNTER — Other Ambulatory Visit: Payer: Self-pay | Admitting: Physician Assistant

## 2020-01-30 DIAGNOSIS — F419 Anxiety disorder, unspecified: Secondary | ICD-10-CM

## 2021-07-23 ENCOUNTER — Ambulatory Visit: Payer: BLUE CROSS/BLUE SHIELD | Admitting: Physician Assistant

## 2021-11-18 ENCOUNTER — Encounter (HOSPITAL_BASED_OUTPATIENT_CLINIC_OR_DEPARTMENT_OTHER): Payer: Self-pay

## 2021-11-18 ENCOUNTER — Emergency Department (HOSPITAL_BASED_OUTPATIENT_CLINIC_OR_DEPARTMENT_OTHER)
Admission: EM | Admit: 2021-11-18 | Discharge: 2021-11-18 | Disposition: A | Discharge status: Home / Self Care | Payer: Self-pay | Attending: Emergency Medicine | Admitting: Emergency Medicine

## 2021-11-18 ENCOUNTER — Emergency Department (HOSPITAL_BASED_OUTPATIENT_CLINIC_OR_DEPARTMENT_OTHER): Payer: Self-pay

## 2021-11-18 ENCOUNTER — Other Ambulatory Visit: Payer: Self-pay

## 2021-11-18 DIAGNOSIS — M25552 Pain in left hip: Secondary | ICD-10-CM | POA: Insufficient documentation

## 2021-11-18 DIAGNOSIS — R519 Headache, unspecified: Secondary | ICD-10-CM | POA: Insufficient documentation

## 2021-11-18 DIAGNOSIS — R079 Chest pain, unspecified: Secondary | ICD-10-CM | POA: Insufficient documentation

## 2021-11-18 DIAGNOSIS — R0602 Shortness of breath: Secondary | ICD-10-CM | POA: Insufficient documentation

## 2021-11-18 DIAGNOSIS — M25551 Pain in right hip: Secondary | ICD-10-CM | POA: Insufficient documentation

## 2021-11-18 DIAGNOSIS — Z79899 Other long term (current) drug therapy: Secondary | ICD-10-CM | POA: Insufficient documentation

## 2021-11-18 DIAGNOSIS — M545 Low back pain, unspecified: Secondary | ICD-10-CM | POA: Insufficient documentation

## 2021-11-18 DIAGNOSIS — Z20822 Contact with and (suspected) exposure to covid-19: Secondary | ICD-10-CM | POA: Insufficient documentation

## 2021-11-18 LAB — URINALYSIS, ROUTINE W REFLEX MICROSCOPIC
Bilirubin Urine: NEGATIVE
Glucose, UA: NEGATIVE mg/dL
Hgb urine dipstick: NEGATIVE
Ketones, ur: NEGATIVE mg/dL
Leukocytes,Ua: NEGATIVE
Nitrite: NEGATIVE
Protein, ur: NEGATIVE mg/dL
Specific Gravity, Urine: 1.02 (ref 1.005–1.030)
pH: 6.5 (ref 5.0–8.0)

## 2021-11-18 LAB — BASIC METABOLIC PANEL
Anion gap: 6 (ref 5–15)
BUN: 13 mg/dL (ref 6–20)
CO2: 27 mmol/L (ref 22–32)
Calcium: 9.2 mg/dL (ref 8.9–10.3)
Chloride: 107 mmol/L (ref 98–111)
Creatinine, Ser: 0.65 mg/dL (ref 0.61–1.24)
GFR, Estimated: >60 mL/min (ref >60)
Glucose, Bld: 101 mg/dL — ABNORMAL HIGH (ref 70–99)
Potassium: 4.1 mmol/L (ref 3.5–5.1)
Sodium: 140 mmol/L (ref 135–145)

## 2021-11-18 LAB — CBC
HCT: 40.3 % (ref 39.0–52.0)
Hemoglobin: 13 g/dL (ref 13.0–17.0)
MCH: 29.3 pg (ref 26.0–34.0)
MCHC: 32.3 g/dL (ref 30.0–36.0)
MCV: 91 fL (ref 80.0–100.0)
Platelets: 167 10*3/uL (ref 150–400)
RBC: 4.43 MIL/uL (ref 4.22–5.81)
RDW: 12.1 % (ref 11.5–15.5)
WBC: 6.4 10*3/uL (ref 4.0–10.5)
nRBC: 0 % (ref 0.0–0.2)

## 2021-11-18 LAB — TROPONIN I (HIGH SENSITIVITY): Troponin I (High Sensitivity): 2 ng/L (ref <18)

## 2021-11-18 LAB — D-DIMER, QUANTITATIVE: D-Dimer, Quant: 0.3 ug/mL-FEU (ref 0.00–0.50)

## 2021-11-18 LAB — SARS CORONAVIRUS 2 BY RT PCR: SARS Coronavirus 2 by RT PCR: NEGATIVE

## 2021-11-18 MED ORDER — KETOROLAC TROMETHAMINE 15 MG/ML IJ SOLN
15.0000 mg | Freq: Once | INTRAMUSCULAR | Status: AC
  Administered 2021-11-18: 15 mg via INTRAVENOUS
  Filled 2021-11-18: qty 1

## 2021-11-18 MED ORDER — OMEPRAZOLE 20 MG PO CPDR: 20.0000 mg | DELAYED_RELEASE_CAPSULE | Freq: Every day | ORAL | 0 refills | Status: AC

## 2021-11-18 MED ORDER — DIPHENHYDRAMINE HCL 50 MG/ML IJ SOLN
12.5000 mg | Freq: Once | INTRAMUSCULAR | Status: AC
  Administered 2021-11-18: 12.5 mg via INTRAVENOUS
  Filled 2021-11-18: qty 1

## 2021-11-18 MED ORDER — DEXAMETHASONE SODIUM PHOSPHATE 10 MG/ML IJ SOLN
10.0000 mg | Freq: Once | INTRAMUSCULAR | Status: AC
  Administered 2021-11-18: 10 mg via INTRAVENOUS
  Filled 2021-11-18: qty 1

## 2021-11-18 MED ORDER — PROCHLORPERAZINE EDISYLATE 10 MG/2ML IJ SOLN
10.0000 mg | INTRAMUSCULAR | Status: AC
Start: 2021-11-18 — End: 2021-11-18
  Administered 2021-11-18: 10 mg via INTRAVENOUS
  Filled 2021-11-18: qty 2

## 2021-11-18 MED ORDER — CYCLOBENZAPRINE HCL 10 MG PO TABS: 10.0000 mg | ORAL_TABLET | Freq: Two times a day (BID) | ORAL | 0 refills | Status: AC | PRN

## 2021-11-18 MED ORDER — SODIUM CHLORIDE 0.9 % IV BOLUS
500.0000 mL | Freq: Once | INTRAVENOUS | Status: AC
  Administered 2021-11-18: 500 mL via INTRAVENOUS

## 2021-11-18 NOTE — Discharge Instructions (Signed)
Please return to the ED with any new or worsening signs or symptoms Please follow-up with your PCP Please read attached guide concerning hip pain and nonspecific chest pain in adults Please pick up muscle relaxer I sent in.  Please also pick up omeprazole if sent in. Please see attached work note Please continue taking ibuprofen/Tylenol at home for hip pain

## 2021-11-18 NOTE — ED Triage Notes (Signed)
C/o headache x 3 days. Also chest pain since Sunday & bilateral hip pain. C/o generalized body aches.

## 2021-11-18 NOTE — ED Provider Notes (Signed)
[facility] EMERGENCY DEPARTMENT Provider Note   CSN: [id number] Arrival date & time: 11/18/21  1003     History  Chief Complaint  Patient presents with   Headache   Chest Pain    Isaiah Heath is a 41 y.o. male with medical history anxiety, diverticulitis, left inguinal hernia.  Patient presents to the ED for evaluation.  Patient reports the last 3 days he has had persistent headache, burning chest, bilateral hip and lumbar spine pain.  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  Patient denies history of headaches, states that this headache is temporarily relieved by ibuprofen.  Patient states that once ibuprofen wears off headache always returns.  Patient denies any red flag symptoms low back pain, denies any bowel or bladder incontinence, lower extremity weakness, groin numbness.  Patient also complaining of chest tightness/burning, denies radiation, denies worsening with exertion.  The patient states that this chest discomfort is also associated with shortness of breath sometimes. Patient denies any fevers, cough, sore throat, nausea, vomiting, diarrhea, abdominal pain, dysuria.   Headache Associated symptoms: no abdominal pain, no cough, no diarrhea, no fever, no nausea, no sore throat and no vomiting   Chest Pain Associated symptoms: headache and shortness of breath   Associated symptoms: no abdominal pain, no cough, no fever, no nausea and no vomiting        Home Medications Prior to Admission medications   Medication Sig Start Date End Date Taking? Authorizing Provider  cyclobenzaprine (FLEXERIL) 10 MG tablet Take 1 tablet (10 mg total) by mouth 2 (two) times daily as needed for muscle spasms. 11/18/21  Yes Al Decant, PA-C  omeprazole (PRILOSEC) 20 MG capsule Take 1 capsule (20 mg total) by mouth daily. 11/18/21  Yes Al Decant, PA-C  amoxicillin-clavulanate  (AUGMENTIN) 875-125 MG tablet Take 1 tablet by mouth 2 (two) times daily. 01/08/20   Mayer Masker, PA-C  hydrOXYzine (ATARAX/VISTARIL) 25 MG tablet Take 1 tablet (25 mg total) by mouth 3 (three) times daily as needed for itching or anxiety. 01/08/20   Abonza, Maritza, PA-C  lisinopril (ZESTRIL) 5 MG tablet Take 1 tablet (5 mg total) by mouth daily. For high blood pressure 01/08/20   Abonza, Maritza, PA-C  methocarbamol (ROBAXIN) 500 MG tablet Take 1 tablet (500 mg total) by mouth 2 (two) times daily. 01/08/20   Mayer Masker, PA-C  sertraline (ZOLOFT) 25 MG tablet Take 1 tablet (25 mg total) by mouth daily. For depression 01/08/20   Mayer Masker, PA-C  traZODone (DESYREL) 50 MG tablet Take 1 tablet (50 mg total) by mouth at bedtime as needed for sleep. 01/08/20   Mayer Masker, PA-C      Allergies    Ciprofloxacin    Review of Systems   Review of Systems  Constitutional:  Positive for chills. Negative for fever.  HENT:  Negative for sore throat.   Respiratory:  Positive for shortness of breath. Negative for cough.   Cardiovascular:  Positive for chest pain.  Gastrointestinal:  Negative for abdominal pain, diarrhea, nausea and vomiting.  Genitourinary:  Negative for dysuria.  Neurological:  Positive for headaches.  All other systems reviewed and are negative.   Physical Exam Updated Vital Signs BP (!) 142/87   Pulse (!) 57   Temp 98 F (36.7 C) (Oral)   Resp 18   Ht 6\' 1"  (1.854 m)   Wt 68.9 kg  SpO2 100%   BMI 20.05 kg/m  Physical Exam Vitals and nursing note reviewed.  Constitutional:      General: He is not in acute distress.    Appearance: He is well-developed. He is not ill-appearing, toxic-appearing or diaphoretic.  HENT:     Head: Normocephalic and atraumatic.     Nose: Nose normal. No congestion.     Mouth/Throat:     Mouth: Mucous membranes are moist.     Pharynx: Oropharynx is clear.  Eyes:     Extraocular Movements: Extraocular movements intact.      Conjunctiva/sclera: Conjunctivae normal.     Pupils: Pupils are equal, round, and reactive to light.  Cardiovascular:     Rate and Rhythm: Normal rate and regular rhythm.  Pulmonary:     Effort: Pulmonary effort is normal.     Breath sounds: Normal breath sounds.  Abdominal:     General: Abdomen is flat. Bowel sounds are normal.     Palpations: Abdomen is soft.     Tenderness: There is no abdominal tenderness.  Musculoskeletal:     Cervical back: Normal range of motion and neck supple. No tenderness.     Lumbar back: Normal.     Right hip: Normal.     Left hip: Normal.     Comments: Patient bilateral hips show full flexion, extension actively.  The patient has no overlying skin change, the patient has no decreased range of motion.  Patient lumbar spine was palpated with findings of deformity, crepitus or step-off.  Patient does not have any midline lumbar spinal tenderness, patient pain is paraspinal bilaterally.  No overlying skin change, full range of motion intact.  Skin:    General: Skin is warm and dry.     Capillary Refill: Capillary refill takes less than 2 seconds.  Neurological:     General: No focal deficit present.     Mental Status: He is alert and oriented to person, place, and time.     GCS: GCS eye subscore is 4. GCS verbal subscore is 5. GCS motor subscore is 6.     Cranial Nerves: Cranial nerves 2-12 are intact. No cranial nerve deficit.     Sensory: Sensation is intact. No sensory deficit.     Motor: Motor function is intact. No weakness.     Coordination: Coordination is intact. Heel to Shin Test normal.     ED Results / Procedures / Treatments   Labs (all labs ordered are listed, but only abnormal results are displayed) Labs Reviewed  BASIC METABOLIC PANEL - Abnormal; Notable for the following components:      Result Value   Glucose, Bld 101 (*)    All other components within normal limits  SARS CORONAVIRUS 2 BY RT PCR  CBC  URINALYSIS, ROUTINE W REFLEX  MICROSCOPIC  D-DIMER, QUANTITATIVE (NOT AT [facility])  TROPONIN I (HIGH SENSITIVITY)    EKG None  Radiology DG Lumbar Spine Complete  Result Date: 11/18/2021 CLINICAL DATA:  Pain EXAM: LUMBAR SPINE - COMPLETE 4 VIEW COMPARISON:  None Available. FINDINGS: No evidence of lumbar spine fracture. Alignment is normal. Mild multilevel degenerative disc disease consisting of osteophyte formation and moderate disc space height loss at L5-S1. Mild facet arthropathy of the lower lumbar spine. Soft tissues are unremarkable. IMPRESSION: 1. No acute osseous abnormality. 2. Multilevel mild-to-moderate degenerative changes of the lumbar spine. Electronically Signed   By: Allegra Lai M.D.   On: 11/18/2021 14:51   DG HIPS BILAT WITH PELVIS 3-4  VIEWS  Result Date: 11/18/2021 CLINICAL DATA:  Bilateral hip pain for 3 days. Radiates to the lumbar spine. Generalized body aches. EXAM: DG HIP (WITH OR WITHOUT PELVIS) 3-4V BILAT COMPARISON:  CT abdomen and pelvis 12/23/2019 FINDINGS: The bilateral sacroiliac, bilateral femoroacetabular, and pubic symphysis joint spaces are maintained. Normal morphology of the bilateral femoral head-neck junction without CAM-type bump deformity. No acute fracture or dislocation. Vascular phleboliths overlie the lower pelvis. IMPRESSION: No significant osteoarthritis of either hip. Electronically Signed   By: Neita Garnet M.D.   On: 11/18/2021 14:49   CT Head Wo Contrast  Result Date: 11/18/2021 CLINICAL DATA:  Sudden severe headache, chest pain, back pain, and BILATERAL hip pain since Sunday EXAM: CT HEAD WITHOUT CONTRAST TECHNIQUE: Contiguous axial images were obtained from the base of the skull through the vertex without intravenous contrast. RADIATION DOSE REDUCTION: This exam was performed according to the departmental dose-optimization program which includes automated exposure control, adjustment of the mA and/or kV according to patient size and/or use of iterative reconstruction  technique. COMPARISON:  04/10/2009 FINDINGS: Brain: Normal ventricular morphology. No midline shift or mass effect. Normal appearance of brain parenchyma. No intracranial hemorrhage, mass lesion, evidence of acute infarction, or extra-axial fluid collection. Vascular: No hyperdense vessels Skull: Intact Sinuses/Orbits: Clear Other: N/A IMPRESSION: Normal exam. Electronically Signed   By: Ulyses Southward M.D.   On: 11/18/2021 13:14   DG Chest 2 View  Result Date: 11/18/2021 CLINICAL DATA:  Chest pain, headache EXAM: CHEST - 2 VIEW COMPARISON:  12/21/2017 FINDINGS: Frontal and lateral views of the chest demonstrate an unremarkable cardiac silhouette. No acute airspace disease, effusion, or pneumothorax. No acute bony abnormality. IMPRESSION: 1. No acute intrathoracic process. Electronically Signed   By: Sharlet Salina M.D.   On: 11/18/2021 10:26    Procedures Procedures   Medications Ordered in ED Medications  sodium chloride 0.9 % bolus 500 mL (0 mLs Intravenous Stopped 11/18/21 1356)  ketorolac (TORADOL) 15 MG/ML injection 15 mg (15 mg Intravenous Given 11/18/21 1226)  dexamethasone (DECADRON) injection 10 mg (10 mg Intravenous Given 11/18/21 1227)  prochlorperazine (COMPAZINE) injection 10 mg (10 mg Intravenous Given 11/18/21 1319)  diphenhydrAMINE (BENADRYL) injection 12.5 mg (12.5 mg Intravenous Given 11/18/21 1320)    ED Course/ Medical Decision Making/ A&P                           Medical Decision Making Amount and/or Complexity of Data Reviewed Labs: ordered. Radiology: ordered.  Risk Prescription drug management.   41 year old male presents to ED for evaluation.  Please see HPI for further details.  On examination the patient is afebrile and nontachycardic.  Patient lung sounds are clear bilaterally, he is not hypoxic on room.  Patient abdomen soft and compressible.  Patient neurological examination shows no focal neurodeficits.  Patient has full range of motion to bilateral hips.   There is no overlying skin change.  Patient is nontoxic in appearance.  Patient work-up included following labs and imaging studies interpreted by me personally: - Urinalysis unremarkable - BMP with elevated glucose to 101 however noncontributory to symptoms, nonspecific - Troponin 2 - D-dimer unremarkable at 0.3 - CBC unremarkable, no leukocytosis - Viral testing negative - Plain film imaging of patient chest shows no cardiomegaly, consolidation, effusion - Plain film imaging of patient bilateral hips, lumbar spine shows no deformity, acute cause of patient's symptoms.  There is noted degenerative changes in the patient's lumbar spine which could be  contributing to his symptoms. - CT head ordered due to patient headache shows no intracranial abnormality - EKG nonischemic  Patient treated with 500 mL normal saline, 50 mg Toradol, 10 mg Decadron.  Patient reports after these were given, his headache persists.  Patient then given 12 and half milligrams Benadryl, 10 mg Compazine.  Patient reports headache is subsided at this time  At this time, most likely cause of patient bilateral hip pain as well as low back pain due to increased lifting and straining at work.  Patient will be advised to rest, will be written out of work for 2 days and provided with muscle relaxers.  Patient advised to take ibuprofen at home for pain relief.  Patient also counseled that most likely cause of patient chest burning due to GERD, will be started on omeprazole.  Patient given return precautions and he voiced understanding.  Patient had all of his questions answered to satisfaction prior to discharge.  Patient stable at this time for discharge home.  Final Clinical Impression(s) / ED Diagnoses Final diagnoses:  Bad headache  Nonspecific chest pain  Bilateral hip pain    Rx / DC Orders ED Discharge Orders          Ordered    cyclobenzaprine (FLEXERIL) 10 MG tablet  2 times daily PRN        11/18/21 1520     omeprazole (PRILOSEC) 20 MG capsule  Daily        11/18/21 1520              Al Decant, [place] 11/18/21 1520    Edwin Dada P, DO 11/20/21 1527

## 2022-09-08 ENCOUNTER — Emergency Department (HOSPITAL_BASED_OUTPATIENT_CLINIC_OR_DEPARTMENT_OTHER): Payer: Self-pay

## 2022-09-08 ENCOUNTER — Emergency Department (HOSPITAL_BASED_OUTPATIENT_CLINIC_OR_DEPARTMENT_OTHER)
Admission: EM | Admit: 2022-09-08 | Discharge: 2022-09-08 | Disposition: A | Discharge status: Home / Self Care | Payer: Self-pay | Attending: Emergency Medicine | Admitting: Emergency Medicine

## 2022-09-08 ENCOUNTER — Other Ambulatory Visit: Payer: Self-pay

## 2022-09-08 ENCOUNTER — Encounter (HOSPITAL_BASED_OUTPATIENT_CLINIC_OR_DEPARTMENT_OTHER): Payer: Self-pay

## 2022-09-08 DIAGNOSIS — S2241XA Multiple fractures of ribs, right side, initial encounter for closed fracture: Secondary | ICD-10-CM

## 2022-09-08 DIAGNOSIS — R1030 Lower abdominal pain, unspecified: Secondary | ICD-10-CM

## 2022-09-08 DIAGNOSIS — R1031 Right lower quadrant pain: Secondary | ICD-10-CM | POA: Insufficient documentation

## 2022-09-08 DIAGNOSIS — X58XXXA Exposure to other specified factors, initial encounter: Secondary | ICD-10-CM | POA: Insufficient documentation

## 2022-09-08 LAB — CBC WITH DIFFERENTIAL/PLATELET
Abs Immature Granulocytes: 0.02 10*3/uL (ref 0.00–0.07)
Basophils Absolute: 0 10*3/uL (ref 0.0–0.1)
Basophils Relative: 1 %
Eosinophils Absolute: 0.3 10*3/uL (ref 0.0–0.5)
Eosinophils Relative: 4 %
HCT: 38.7 % — ABNORMAL LOW (ref 39.0–52.0)
Hemoglobin: 12.4 g/dL — ABNORMAL LOW (ref 13.0–17.0)
Immature Granulocytes: 0 %
Lymphocytes Relative: 29 %
Lymphs Abs: 1.9 10*3/uL (ref 0.7–4.0)
MCH: 29.5 pg (ref 26.0–34.0)
MCHC: 32 g/dL (ref 30.0–36.0)
MCV: 91.9 fL (ref 80.0–100.0)
Monocytes Absolute: 0.4 10*3/uL (ref 0.1–1.0)
Monocytes Relative: 6 %
Neutro Abs: 3.9 10*3/uL (ref 1.7–7.7)
Neutrophils Relative %: 60 %
Platelets: 195 10*3/uL (ref 150–400)
RBC: 4.21 MIL/uL — ABNORMAL LOW (ref 4.22–5.81)
RDW: 12.3 % (ref 11.5–15.5)
WBC: 6.6 10*3/uL (ref 4.0–10.5)
nRBC: 0 % (ref 0.0–0.2)

## 2022-09-08 LAB — URINALYSIS, ROUTINE W REFLEX MICROSCOPIC
Bilirubin Urine: NEGATIVE
Glucose, UA: NEGATIVE mg/dL
Hgb urine dipstick: NEGATIVE
Ketones, ur: NEGATIVE mg/dL
Leukocytes,Ua: NEGATIVE
Nitrite: NEGATIVE
Protein, ur: NEGATIVE mg/dL
Specific Gravity, Urine: 1.02 (ref 1.005–1.030)
pH: 7 (ref 5.0–8.0)

## 2022-09-08 LAB — COMPREHENSIVE METABOLIC PANEL
ALT: 16 U/L (ref 0–44)
AST: 22 U/L (ref 15–41)
Albumin: 3.9 g/dL (ref 3.5–5.0)
Alkaline Phosphatase: 63 U/L (ref 38–126)
Anion gap: 6 (ref 5–15)
BUN: 10 mg/dL (ref 6–20)
CO2: 26 mmol/L (ref 22–32)
Calcium: 8.7 mg/dL — ABNORMAL LOW (ref 8.9–10.3)
Chloride: 102 mmol/L (ref 98–111)
Creatinine, Ser: 0.79 mg/dL (ref 0.61–1.24)
GFR, Estimated: >60 mL/min (ref >60)
Glucose, Bld: 104 mg/dL — ABNORMAL HIGH (ref 70–99)
Potassium: 3.8 mmol/L (ref 3.5–5.1)
Sodium: 134 mmol/L — ABNORMAL LOW (ref 135–145)
Total Bilirubin: 0.5 mg/dL (ref 0.3–1.2)
Total Protein: 6.9 g/dL (ref 6.5–8.1)

## 2022-09-08 LAB — LIPASE, BLOOD: Lipase: 29 U/L (ref 11–51)

## 2022-09-08 MED ORDER — LIDOCAINE 5 % EX PTCH: 1.0000 | MEDICATED_PATCH | CUTANEOUS | 0 refills | Status: AC

## 2022-09-08 MED ORDER — HYDROCODONE-ACETAMINOPHEN 5-325 MG PO TABS: 1.0000 | ORAL_TABLET | ORAL | 0 refills | Status: AC | PRN

## 2022-09-08 MED ORDER — LIDOCAINE 5 % EX PTCH
1.0000 | MEDICATED_PATCH | CUTANEOUS | Status: DC
  Administered 2022-09-08: 1 via TRANSDERMAL
  Filled 2022-09-08: qty 1

## 2022-09-08 MED ORDER — IOHEXOL 300 MG/ML  SOLN
100.0000 mL | Freq: Once | INTRAMUSCULAR | Status: AC | PRN
  Administered 2022-09-08: 100 mL via INTRAVENOUS

## 2022-09-08 MED ORDER — MORPHINE SULFATE (PF) 4 MG/ML IV SOLN
4.0000 mg | Freq: Once | INTRAVENOUS | Status: AC
  Administered 2022-09-08: 4 mg via INTRAVENOUS
  Filled 2022-09-08: qty 1

## 2022-09-08 MED ORDER — ONDANSETRON HCL 4 MG/2ML IJ SOLN
4.0000 mg | Freq: Once | INTRAMUSCULAR | Status: AC
  Administered 2022-09-08: 4 mg via INTRAVENOUS
  Filled 2022-09-08: qty 2

## 2022-09-08 NOTE — ED Triage Notes (Signed)
C/o abdominal pain/swelling x 4 days. He of hernias/hernia repair. Pain/swelling near umbilicus. Normal BM's

## 2022-09-08 NOTE — ED Notes (Signed)
Pt endorses 4 days of pain with R sided bloating and tendereness. Hx of diverticulitis. No recent flareup in last few years though. No n/v/d, does not believe it is gas retention, has not attempted or needed GasX, etc.

## 2022-09-08 NOTE — ED Provider Notes (Signed)
[facility] Provider Note   CSN: [id number] Arrival date & time: 09/08/22  1049     History  Chief Complaint  Patient presents with   Abdominal Pain    Isaiah Heath is a 42 y.o. male.  42 year old male with a past medical history of diverticulitis presents to the ED with a chief complaint of abdominal distention for the past 4 days.  Patient reports a prior history of diverticulitis, with similar presentation, however all the symptoms are occurring on the right side at this time.  Reports his last bowel movement was this morning.  He reports it hurts when he coughs, takes a deep breath along the lower abdominal area.  States that he cannot lay flat on his back as this exacerbates the pain.  He has a "burning sensation" in the pelvic area.  He is taken ibuprofen 2 days ago with no improvement in his symptoms.  He reports if he eats this exacerbates his symptoms by feeling the lower sensation worsen.  His last oral intake was yesterday around 9:00 with [employer].  He denies any nausea, no vomiting, no diarrhea.  No chest pain or shortness of breath.   The history is provided by the patient.  Abdominal Pain Associated symptoms: no chest pain, no chills, no diarrhea, no fever, no nausea, no shortness of breath, no sore throat and no vomiting        Home Medications Prior to Admission medications   Medication Sig Start Date End Date Taking? Authorizing Provider  HYDROcodone-acetaminophen (NORCO/VICODIN) 5-325 MG tablet Take 1 tablet by mouth every 4 (four) hours as needed for up to 3 days. 09/08/22 09/11/22 Yes Inda Mcglothen, PA-C  lidocaine (LIDODERM) 5 % Place 1 patch onto the skin daily for 5 days. Remove & Discard patch within 12 hours or as directed by MD 09/08/22 09/13/22 Yes Heidemarie Goodnow, Leonie Douglas, PA-C  lisinopril (ZESTRIL) 5 MG tablet Take 1 tablet (5 mg total) by mouth daily. For high blood pressure 01/08/20  Yes Abonza, Maritza, PA-C   omeprazole (PRILOSEC) 20 MG capsule Take 1 capsule (20 mg total) by mouth daily. 11/18/21  Yes Al Decant, PA-C  traZODone (DESYREL) 50 MG tablet Take 1 tablet (50 mg total) by mouth at bedtime as needed for sleep. 01/08/20  Yes Abonza, Maritza, PA-C  amoxicillin-clavulanate (AUGMENTIN) 875-125 MG tablet Take 1 tablet by mouth 2 (two) times daily. Patient not taking: Reported on 09/08/2022 01/08/20   Mayer Masker, PA-C  cyclobenzaprine (FLEXERIL) 10 MG tablet Take 1 tablet (10 mg total) by mouth 2 (two) times daily as needed for muscle spasms. Patient not taking: Reported on 09/08/2022 11/18/21   Al Decant, PA-C  hydrOXYzine (ATARAX/VISTARIL) 25 MG tablet Take 1 tablet (25 mg total) by mouth 3 (three) times daily as needed for itching or anxiety. Patient not taking: Reported on 09/08/2022 01/08/20   Mayer Masker, PA-C  methocarbamol (ROBAXIN) 500 MG tablet Take 1 tablet (500 mg total) by mouth 2 (two) times daily. Patient not taking: Reported on 09/08/2022 01/08/20   Mayer Masker, PA-C  sertraline (ZOLOFT) 25 MG tablet Take 1 tablet (25 mg total) by mouth daily. For depression Patient not taking: Reported on 09/08/2022 01/08/20   Mayer Masker, PA-C      Allergies    Ciprofloxacin    Review of Systems   Review of Systems  Constitutional:  Negative for chills and fever.  HENT:  Negative for sore throat.   Respiratory:  Negative  for shortness of breath.   Cardiovascular:  Negative for chest pain.  Gastrointestinal:  Positive for abdominal pain. Negative for blood in stool, diarrhea, nausea and vomiting.  Genitourinary:  Negative for flank pain.  Musculoskeletal:  Negative for back pain.  All other systems reviewed and are negative.   Physical Exam Updated Vital Signs BP (!) 141/93   Pulse (!) 54   Temp 98.3 F (36.8 C)   Resp 17   Ht 6\' 1"  (1.854 m)   Wt 67.6 kg   SpO2 100%   BMI 19.66 kg/m  Physical Exam Vitals and nursing note reviewed.   Constitutional:      Appearance: He is well-developed.  HENT:     Head: Normocephalic and atraumatic.  Eyes:     General: No scleral icterus.    Pupils: Pupils are equal, round, and reactive to light.  Cardiovascular:     Heart sounds: Normal heart sounds.  Pulmonary:     Effort: Pulmonary effort is normal.     Breath sounds: Normal breath sounds. No wheezing.  Chest:     Chest wall: No tenderness.  Abdominal:     General: Bowel sounds are normal. There is no distension.     Palpations: Abdomen is soft.     Tenderness: There is abdominal tenderness in the right lower quadrant. There is no right CVA tenderness or left CVA tenderness.  Musculoskeletal:        General: No tenderness or deformity.     Cervical back: Normal range of motion.  Skin:    General: Skin is warm and dry.  Neurological:     Mental Status: He is alert and oriented to person, place, and time.     ED Results / Procedures / Treatments   Labs (all labs ordered are listed, but only abnormal results are displayed) Labs Reviewed  CBC WITH DIFFERENTIAL/PLATELET - Abnormal; Notable for the following components:      Result Value   RBC 4.21 (*)    Hemoglobin 12.4 (*)    HCT 38.7 (*)    All other components within normal limits  COMPREHENSIVE METABOLIC PANEL - Abnormal; Notable for the following components:   Sodium 134 (*)    Glucose, Bld 104 (*)    Calcium 8.7 (*)    All other components within normal limits  LIPASE, BLOOD  URINALYSIS, ROUTINE W REFLEX MICROSCOPIC    EKG None  Radiology CT Chest Wo Contrast  Result Date: 09/08/2022 CLINICAL DATA:  Rib fractures after fall last week. EXAM: CT CHEST WITHOUT CONTRAST TECHNIQUE: Multidetector CT imaging of the chest was performed following the standard protocol without IV contrast. RADIATION DOSE REDUCTION: This exam was performed according to the departmental dose-optimization program which includes automated exposure control, adjustment of the mA and/or  kV according to patient size and/or use of iterative reconstruction technique. COMPARISON:  CT scan of same day. FINDINGS: Cardiovascular: No significant vascular findings. Normal heart size. No pericardial effusion. Mediastinum/Nodes: No enlarged mediastinal or axillary lymph nodes. Thyroid gland, trachea, and esophagus demonstrate no significant findings. Lungs/Pleura: No pneumothorax or pleural effusion is noted. Multiple subpleural blebs are noted in both lung apices. No acute pulmonary abnormality is noted. Upper Abdomen: No acute abnormality. Musculoskeletal: Mildly displaced fracture involving lateral portion of right tenth rib. Nondisplaced fracture involving lateral portion of right ninth rib. IMPRESSION: Acute right ninth and tenth rib fractures as noted above. Multiple subpleural blebs are seen involving both lung apices. Electronically Signed   By: Fayrene Fearing  Christen Butter M.D.   On: 09/08/2022 15:38   CT ABDOMEN PELVIS W CONTRAST  Result Date: 09/08/2022 CLINICAL DATA:  Abdominal pain, acute, nonlocalized. Abd pain x4days.hx of surgs for hernia. EXAM: CT ABDOMEN AND PELVIS WITH CONTRAST TECHNIQUE: Multidetector CT imaging of the abdomen and pelvis was performed using the standard protocol following bolus administration of intravenous contrast. RADIATION DOSE REDUCTION: This exam was performed according to the departmental dose-optimization program which includes automated exposure control, adjustment of the mA and/or kV according to patient size and/or use of iterative reconstruction technique. CONTRAST:  OMNIPAQUE IOHEXOL 300 MG/ML  SOLN COMPARISON:  CT abdomen pelvis 12/23/2019 FINDINGS: Lower chest: No acute abnormality. Hepatobiliary: No focal liver abnormality. No gallstones, gallbladder wall thickening, or pericholecystic fluid. No biliary dilatation. Pancreas: No focal lesion. Normal pancreatic contour. No surrounding inflammatory changes. No main pancreatic ductal dilatation. Spleen: Normal in  size without focal abnormality. Adrenals/Urinary Tract: No adrenal nodule bilaterally. Bilateral kidneys enhance symmetrically. No hydronephrosis. No hydroureter. The urinary bladder is unremarkable. Stomach/Bowel: Stomach is under distended and within normal limits. No evidence of bowel wall thickening or dilatation. Appendix appears normal. Vascular/Lymphatic: No abdominal aorta or iliac aneurysm. Mild atherosclerotic plaque of the aorta and its branches. No abdominal, pelvic, or inguinal lymphadenopathy. Reproductive: Prostate is unremarkable. Other: No intraperitoneal free fluid. No intraperitoneal free gas. No organized fluid collection. Musculoskeletal: No abdominal wall hernia or abnormality. No suspicious lytic or blastic osseous lesions. Likely acute displaced right posterolateral tenth and lateral ninth rib fractures. L5-S1 intervertebral disc space vacuum phenomenon and endplate sclerosis. Also noted osteophyte formation. Multilevel mild lumbar spine degenerative changes. IMPRESSION: 1. Likely acute displaced right lateral 9th and posterolateral 10th rib fractures. Recommend point tenderness to palpation to evaluate for acuity. Consider CT chest with intravenous contrast for further evaluation in the setting of traumatic injury. 2. No acute intra-abdominal or intrapelvic abnormality. Electronically Signed   By: Tish Frederickson M.D.   On: 09/08/2022 13:36    Procedures Procedures    Medications Ordered in ED Medications  lidocaine (LIDODERM) 5 % 1 patch (1 patch Transdermal Patch Applied 09/08/22 1525)  morphine (PF) 4 MG/ML injection 4 mg (4 mg Intravenous Given 09/08/22 1227)  ondansetron (ZOFRAN) injection 4 mg (4 mg Intravenous Given 09/08/22 1227)  iohexol (OMNIPAQUE) 300 MG/ML solution 100 mL (100 mLs Intravenous Contrast Given 09/08/22 1244)  morphine (PF) 4 MG/ML injection 4 mg (4 mg Intravenous Given 09/08/22 1523)    ED Course/ Medical Decision Making/ A&P                              Medical Decision Making Amount and/or Complexity of Data Reviewed Labs: ordered. Radiology: ordered.  Risk Prescription drug management.     This patient presents to the ED for concern of abdominal pain, this involves a number of treatment options, and is a complaint that carries with it a high risk of complications and morbidity.  The differential diagnosis includes appendicitis, diverticulitis versus viral illness.    Co morbidities: Discussed in HPI   Brief History:  See HPI.   EMR reviewed including pt PMHx, past surgical history and past visits to ER.   See HPI for more details   Lab Tests:  I ordered and independently interpreted labs.  The pertinent results include:    I personally reviewed all laboratory work and imaging. Metabolic panel without any acute abnormality specifically kidney function within normal limits and no significant  electrolyte abnormalities. CBC without leukocytosis or significant anemia.   Imaging Studies:  Ct abdomen: 1. Likely acute displaced right lateral 9th and posterolateral 10th  rib fractures. Recommend point tenderness to palpation to evaluate  for acuity. Consider CT chest with intravenous contrast for further  evaluation in the setting of traumatic injury.  2. No acute intra-abdominal or intrapelvic abnormality.      Cardiac Monitoring:  N/A   Medicines ordered:  I ordered medication including  morphine, zofran  for symptomatic improvement.  Reevaluation of the patient after these medicines showed that the patient improved I have reviewed the patients home medicines and have made adjustments as needed  Reevaluation:  After the interventions noted above I re-evaluated patient and found that they have :improved   Social Determinants of Health:  The patient's social determinants of health were a factor in the care of this patient   Problem List / ED Course:  Patient presents to the ED with a chief complaint of  lower abdominal pain has been ongoing for the past 5 days, prior history of diverticulitis, feels that symptoms are somewhat similar to this however these are now happening on the right side, has taken some Motrin without much improvement in symptoms.  Normal bowel movements, no nausea, no vomiting.  Last had [employer] last night.  Feels that his abdomen is somewhat distended but is passing gas.  The position of his blood work is unremarkable, CBC with no leukocytosis, hemoglobin slightly decreased but at his baseline.  CMP with no electrolyte derangement, creatinine levels unremarkable.  LFTs are within normal limits.  Lipase level is normal.  UA is also negative without any signs of infection.  Pain happens to be along the right upper quadrant and right lower quadrant, some suspicion for appendicitis versus gallbladder etiology although less likely with normal LFTs no nausea no vomiting. CT abdomen without any appendicitis, diverticulitis, concern for nondisplaced rib fractures.  Patient now tells me that he had a fall on Monday, reports worsening pain to that area feels like his stomach is distended.  Will proceed with CT chest in order to rule out any further acute finding.  Given morphine x 2 for pain control with lidocaine patch applied to the right upper quadrant to help with pain control. CT of his chest obtained which does show acute fractures of the ninth and 10th rib, he was given and the symptoms primary.  We discussed pain control with Norco, along with lidocaine patches.  We also discussed supportive treatment of pain with Tylenol versus Motrin. He remains hemodynamically stable, no signs of hypoxia, no signs of tachycardia.  Patient is hemodynamically stable for discharge.   Dispostion:  After consideration of the diagnostic results and the patients response to treatment, I feel that the patent would benefit from continued symptomatic treatment of rib fractures, follow-up with PCP as  needed.   Portions of this note were generated with Scientist, clinical (histocompatibility and immunogenetics). Dictation errors may occur despite best attempts at proofreading.   Final Clinical Impression(s) / ED Diagnoses Final diagnoses:  Lower abdominal pain  Closed fracture of multiple ribs of right side, initial encounter    Rx / DC Orders ED Discharge Orders          Ordered    HYDROcodone-acetaminophen (NORCO/VICODIN) 5-325 MG tablet  Every 4 hours PRN        09/08/22 1602    lidocaine (LIDODERM) 5 %  Every 24 hours        09/08/22 1602  Claude Manges, PA-C 09/08/22 1603    Sloan Leiter, DO 09/10/22 [id number]
